# Patient Record
Sex: Female | Born: 1935 | Race: White | Hispanic: No | Marital: Married | Smoking: Former smoker
Health system: Southern US, Community
[De-identification: ages and names within clinical notes are randomized; demographics above are authoritative.]

## PROBLEM LIST (undated history)

## (undated) DIAGNOSIS — I495 Sick sinus syndrome: Secondary | ICD-10-CM

## (undated) DIAGNOSIS — G629 Polyneuropathy, unspecified: Secondary | ICD-10-CM

## (undated) DIAGNOSIS — F0390 Unspecified dementia without behavioral disturbance: Secondary | ICD-10-CM

## (undated) DIAGNOSIS — I4891 Unspecified atrial fibrillation: Secondary | ICD-10-CM

## (undated) DIAGNOSIS — K900 Celiac disease: Secondary | ICD-10-CM

## (undated) DIAGNOSIS — I509 Heart failure, unspecified: Secondary | ICD-10-CM

## (undated) DIAGNOSIS — J449 Chronic obstructive pulmonary disease, unspecified: Secondary | ICD-10-CM

## (undated) DIAGNOSIS — C3490 Malignant neoplasm of unspecified part of unspecified bronchus or lung: Secondary | ICD-10-CM

## (undated) DIAGNOSIS — R413 Other amnesia: Secondary | ICD-10-CM

## (undated) DIAGNOSIS — C801 Malignant (primary) neoplasm, unspecified: Secondary | ICD-10-CM

## (undated) DIAGNOSIS — J189 Pneumonia, unspecified organism: Secondary | ICD-10-CM

## (undated) HISTORY — PX: EYE SURGERY: SHX253

## (undated) HISTORY — DX: Polyneuropathy, unspecified: G62.9

## (undated) HISTORY — DX: Pneumonia, unspecified organism: J18.9

## (undated) HISTORY — PX: PACEMAKER INSERTION: SHX728

## (undated) HISTORY — PX: LUNG CANCER SURGERY: SHX702

## (undated) HISTORY — DX: Chronic obstructive pulmonary disease, unspecified: J44.9

## (undated) HISTORY — DX: Malignant neoplasm of unspecified part of unspecified bronchus or lung: C34.90

## (undated) HISTORY — DX: Unspecified atrial fibrillation: I48.91

---

## 2008-11-22 DIAGNOSIS — C3490 Malignant neoplasm of unspecified part of unspecified bronchus or lung: Secondary | ICD-10-CM

## 2008-11-22 HISTORY — DX: Malignant neoplasm of unspecified part of unspecified bronchus or lung: C34.90

## 2010-04-24 DIAGNOSIS — C3490 Malignant neoplasm of unspecified part of unspecified bronchus or lung: Secondary | ICD-10-CM | POA: Insufficient documentation

## 2010-08-20 DIAGNOSIS — J910 Malignant pleural effusion: Secondary | ICD-10-CM | POA: Insufficient documentation

## 2010-09-21 HISTORY — PX: LUNG LOBECTOMY: SHX167

## 2011-02-16 HISTORY — PX: PACEMAKER PLACEMENT: SHX43

## 2011-09-08 DIAGNOSIS — Z950 Presence of cardiac pacemaker: Secondary | ICD-10-CM | POA: Insufficient documentation

## 2011-09-08 DIAGNOSIS — C3490 Malignant neoplasm of unspecified part of unspecified bronchus or lung: Secondary | ICD-10-CM | POA: Insufficient documentation

## 2012-05-10 DIAGNOSIS — Z45018 Encounter for adjustment and management of other part of cardiac pacemaker: Secondary | ICD-10-CM | POA: Insufficient documentation

## 2012-06-13 DIAGNOSIS — R0602 Shortness of breath: Secondary | ICD-10-CM | POA: Insufficient documentation

## 2014-08-16 ENCOUNTER — Encounter: Payer: Self-pay | Admitting: Neurology

## 2014-08-16 ENCOUNTER — Ambulatory Visit (INDEPENDENT_AMBULATORY_CARE_PROVIDER_SITE_OTHER): Payer: Medicare Other | Admitting: Neurology

## 2014-08-16 VITALS — BP 100/82 | HR 66 | Resp 16 | Ht 61.5 in | Wt 110.0 lb

## 2014-08-16 DIAGNOSIS — K900 Celiac disease: Secondary | ICD-10-CM

## 2014-08-16 DIAGNOSIS — G629 Polyneuropathy, unspecified: Secondary | ICD-10-CM | POA: Insufficient documentation

## 2014-08-16 DIAGNOSIS — F0390 Unspecified dementia without behavioral disturbance: Secondary | ICD-10-CM

## 2014-08-16 DIAGNOSIS — I4891 Unspecified atrial fibrillation: Secondary | ICD-10-CM | POA: Insufficient documentation

## 2014-08-16 DIAGNOSIS — G589 Mononeuropathy, unspecified: Secondary | ICD-10-CM

## 2014-08-16 DIAGNOSIS — C3490 Malignant neoplasm of unspecified part of unspecified bronchus or lung: Secondary | ICD-10-CM

## 2014-08-16 MED ORDER — GABAPENTIN 300 MG PO CAPS: ORAL_CAPSULE | ORAL | Status: DC

## 2014-08-16 NOTE — Progress Notes (Signed)
Done

## 2014-08-16 NOTE — Progress Notes (Signed)
NEUROLOGY CONSULTATION NOTE  Cheryl Cannon MRN: [id number] DOB: Sep 12, 1936  Referring provider: Macarthur Critchley, PsyD Primary care provider: none  Reason for consult:  Establish care for memory loss, neuropathy  Thank you for your kind referral of Cheryl Cannon for consultation of the above symptoms. Although her history is well known to you, please allow me to reiterate it for the purpose of our medical record. The patient was accompanied to the clinic by her husband who also provides collateral information. Records and images were personally reviewed where available.  HISTORY OF PRESENT ILLNESS: This is a very pleasant 78 year old right-handed woman with a history of non-small cell lung cancer s/p surgery, chemotherapy and radiation, atrial fibrillation s/p pacemaker placement, celiac disease, presenting to establish care for mild dementia and neuropathy.  1. Memory loss.  She and her husband started noticing memory changes at the beginning of the year. She reports that her "memory has never been great," but that it became more noticeable where she would forget names of familiar people or forget recent conversations, repeat questions.  She occasionally has word-finding difficulties, and has noticed it more lately.  Her husband has always made sure she takes her medications. She has occasional difficulties multitasking. She cooks and has not left the stove on. She drove without any difficulties, and after recent neuropsychological evaluation, she underwent a road test with the [facility] and has a restricted license.  Her husband has been in charge of the bills since her diagnosis of cancer in 2010. Her husband also noted difficulties with reasoning and problem solving.  No personality changes or hallucinations.  She had seen neurologist Dr. Carmelina Paddock, head CT with and without contrast done in 03/2014 did not show any acute changes, no evidence of brain metastasis. Routine EEG 02/2014 normal.  She  underwent Neuropsychological evaluation on 03/28/14, results reviewed, impression of mild dementia most likely secondary to Alzheimer's disease (rule out mixed dementia), with significant deficits in memory, semantic fluency, processing speed, and aspects of executive functioning. There was also evidence that these deficits are beginning to interfere with ability to perform complex tasks. Cognitive profile reflective of both cortical and subcortical involvement. There is evidence of hippocampal consolidation dysfunction, which is highly concerning for Alzheimer's disease. She also has several vascular risk factors which could be contributing to subcortical dysfunction and a mixed dementia. She started Aricept, with no side effects except she now "dreams a lot," affecting her sleep.  2. Neuropathy. She reports numbness and tingling in her fingertips over the past year. She states that symptoms "seemed to progress" after she was diagnosed with neuropathy in 03/2014. She now has paresthesias (tingling) from the bottom of her feet to the top of her head. She can't taste as much due to tingling in her tongue.  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 Needle exam not done due to anticoagulation with Pradaxa. Neuropathy labs available indicate normal SPEP. She is on B12 replacement. She had been taking gabapentin 900mg  BID for 3 years for shingles in the buttock region. She had tried increasing this to 900mg  TID but did not notice much difference, no side effects.  She denies any neck or back pain. She has occasional generalized body weakness where "my legs won't work," attributed to shortness of breath.  No bowel/bladder dysfunction. Celiac disease is controlled on strict diet.    She denies any headaches, dizziness, diplopia, dysarthria, dysphagia, tremors, anosmia, REM behavior disorder. She has noticed increased salivation  for the past 1-3 months, more when asleep. She  does not choke. She has urinary frequency. She has occasional hand jerks, and notes that her hands become shaky when holding a hymn book at church, resolving when she puts the book down. She was diagnosed with lung cancer in 2010, underwent chemo until march 2011, radiation until December 2011, then another round of chemo until 2013.  She continues to follow at [facility] and has been told imaging has been stable.    PAST MEDICAL HISTORY: Past Medical History  Diagnosis Date  . Lung cancer   . A-fib   . COPD (chronic obstructive pulmonary disease)     PAST SURGICAL HISTORY: Past Surgical History  Procedure Laterality Date  . Lung cancer surgery    . Pacemaker placement      MEDICATIONS: No current outpatient prescriptions on file prior to visit.   No current facility-administered medications on file prior to visit.    ALLERGIES: Allergies  Allergen Reactions  . Gluten Meal Nausea And Vomiting  . Parabens Rash    FAMILY HISTORY: Family History  Problem Relation Age of Onset  . Cancer Mother   . Cancer Sister     SOCIAL HISTORY: History   Social History  . Marital Status: Married    Spouse Name: N/A    Number of Children: N/A  . Years of Education: N/A   Occupational History  . Not on file.   Social History Main Topics  . Smoking status: Former Research scientist (life sciences)  . Smokeless tobacco: Not on file  . Alcohol Use: Yes  . Drug Use: No  . Sexual Activity: Not on file   Other Topics Concern  . Not on file   Social History Narrative  . No narrative on file    REVIEW OF SYSTEMS: Constitutional: No fevers, chills, or sweats, no generalized fatigue, change in appetite Eyes: No visual changes, double vision, eye pain Ear, nose and throat: No hearing loss, ear pain, nasal congestion, sore throat Cardiovascular: No chest pain, palpitations Respiratory:  + shortness of breath at rest or with exertion, no wheezes GastrointestinaI: No nausea, vomiting, diarrhea, abdominal pain,  fecal incontinence Genitourinary:  No dysuria, urinary retention, + frequency Musculoskeletal:  No neck pain, back pain Integumentary: No rash, pruritus, skin lesions Neurological: as above Psychiatric: No depression, insomnia, anxiety Endocrine: No palpitations, fatigue, diaphoresis, mood swings, change in appetite, change in weight, increased thirst Hematologic/Lymphatic:  No anemia, purpura, petechiae. Allergic/Immunologic: no itchy/runny eyes, nasal congestion, recent allergic reactions, rashes  PHYSICAL EXAM: Filed Vitals:   08/16/14 1027  BP: 100/82  Pulse: 66  Resp: 16   General: No acute distress Head:  Normocephalic/atraumatic Eyes: Fundoscopic exam shows bilateral sharp discs, no vessel changes, exudates, or hemorrhages Neck: supple, no paraspinal tenderness, full range of motion Back: No paraspinal tenderness Heart: regular rate and rhythm Lungs: Clear to auscultation bilaterally. Vascular: No carotid bruits. Skin/Extremities: No rash, no edema Neurological Exam: Mental status: alert and oriented to person, place, and time, no dysarthria or aphasia, Fund of knowledge is appropriate.  Remote memory intact.  Attention and concentration are normal.    Able to name objects and repeat phrases.  Montreal Cognitive Assessment  08/16/2014  Visuospatial/ Executive (0/5) 4  Naming (0/3) 3  Attention: Read list of digits (0/2) 2  Attention: Read list of letters (0/1) 1  Attention: Serial 7 subtraction starting at 100 (0/3) 3  Language: Repeat phrase (0/2) 1  Language : Fluency (0/1) 0  Abstraction (0/2) 2  Delayed Recall (0/5) 0  Orientation (0/6) 5  Total 21  Adjusted Score (based on education) 21   Cranial nerves: CN I: not tested CN II: pupils equal, round and reactive to light, visual fields intact, fundi unremarkable. CN III, IV, VI:  full range of motion, no nystagmus, no ptosis CN V: facial sensation intact CN VII: upper and lower face symmetric CN VIII: hearing  intact to finger rub CN IX, X: gag intact, uvula midline CN XI: sternocleidomastoid and trapezius muscles intact CN XII: tongue midline Bulk & Tone: normal, no fasciculations. Motor: 5/5 throughout with no pronator drift. Sensation: decreased cold, pin, and vibration in stocking distribution up to bilateral knees.  Intact to joint position sense. Intact to all modalities on both UE.  Romberg test slight sway Deep Tendon Reflexes: +1 throughout except for absent ankle jerks, no ankle clonus Plantar responses: downgoing bilaterally Cerebellar: no incoordination on finger to nose. No dysdiadochokinesia Gait: narrow-based and steady, mild difficulty with tandem walk Tremor: none  IMPRESSION: This is a very pleasant 78 year old right-handed woman with a history of mild dementia and sensorimotor polyneuropathy, etiology possibly due to chemotherapy. She also has celiac disease which can also cause neuropathy. Celiac disease is under control with diet. Other neuropathy labs done by her other providers will be requested for review. We discussed symptomatic treatment of paresthesias, she will increase dose of gabapentin to 900 in AM, 1200mg  qhs. This may help with sleep as well. We discussed the diagnosis of mild dementia, continuation of Aricept, and the importance of physical exercise, brain stimulation exercises, and control of vascular risk factors for brain health. She received a restricted license with the [facility] and her husband will continue to monitor driving. She will follow-up in 3 months, all their questions were answered.  Thank you for allowing me to participate in the care of this patient. Please do not hesitate to call for any questions or concerns.   Ellouise Newer, M.D.  CC: Dr. Si Raider

## 2014-08-16 NOTE — Patient Instructions (Signed)
1. Continue Aricept 10mg  daily 2. Increase Gabapentin 300mg : Take 3 caps in AM, 4 caps in PM 3. Physical and brain stimulation exercises are important for brain health 4. Follow-up in 3 months

## 2014-10-20 DIAGNOSIS — J449 Chronic obstructive pulmonary disease, unspecified: Secondary | ICD-10-CM | POA: Insufficient documentation

## 2014-10-20 DIAGNOSIS — G629 Polyneuropathy, unspecified: Secondary | ICD-10-CM | POA: Insufficient documentation

## 2014-11-08 ENCOUNTER — Ambulatory Visit (INDEPENDENT_AMBULATORY_CARE_PROVIDER_SITE_OTHER): Payer: Medicare Other | Admitting: Neurology

## 2014-11-08 ENCOUNTER — Encounter: Payer: Self-pay | Admitting: Neurology

## 2014-11-08 VITALS — BP 110/64 | HR 78 | Resp 16 | Ht 61.5 in | Wt 109.0 lb

## 2014-11-08 DIAGNOSIS — F0390 Unspecified dementia without behavioral disturbance: Secondary | ICD-10-CM

## 2014-11-08 DIAGNOSIS — G629 Polyneuropathy, unspecified: Secondary | ICD-10-CM

## 2014-11-08 DIAGNOSIS — C3490 Malignant neoplasm of unspecified part of unspecified bronchus or lung: Secondary | ICD-10-CM

## 2014-11-08 NOTE — Progress Notes (Signed)
NEUROLOGY FOLLOW UP OFFICE NOTE  Cheryl Cannon [id number]  HISTORY OF PRESENT ILLNESS: I had the pleasure of seeing Cheryl Cannon in follow-up in the neurology clinic on 11/08/2014.  The patient was last seen 3 months ago for memory loss and neuropathy, and is accompanied by her husband today. Her neuropsychological evaluation in May 2015 indicated mild dementia. Her MOCA score at that time was 21/30. She had been taking Aricept 10mg /day. She feels her memory sharpness comes and goes, sometimes she cannot recall anything. Names are her biggest issue. She has a limited driver's license from the [facility] and it takes her a little while longer to learn directions and locations. She denies any difficulties multitasking or with ADLs.  She also reported paresthesias in her fingers and feet, NCV in April 2015 showed a primary sensory motor polyneuropathy. Dose of Gabapentin was increased to 900mg  in AM, 1200mg  in PM. She has not noticed much change in symptoms with higher dose, no side effects. The soles of her feet and her fingers continue to be numb, with occasional tingling in her legs. She feels unbalanced without her shoes, no falls. She was noted to have a B12 level of 227 last month and started on B12 replacement.  Most recent labs: 10/10/14 CBC and CMP normal. Vitamin B12 227; TSH 1.635; vitamin D 42.4, total cholesterol 207, LDL126.  HPI:  This is a very pleasant 78 yo RH woman with a history of non-small cell lung cancer s/p surgery, chemotherapy and radiation, atrial fibrillation s/p pacemaker placement, celiac disease, with mild dementia and neuropathy.  1. Memory loss. She and her husband started noticing memory changes at the beginning of 2015. She reports that her "memory has never been great," but that it became more noticeable where she would forget names of familiar people or forget recent conversations, repeat questions. She occasionally has word-finding difficulties, and has noticed it more  lately. Her husband has always made sure she takes her medications. She cooks and has not left the stove on. She drove without any difficulties, and after recent neuropsychological evaluation, she underwent a road test with the [facility] and has a restricted license. Her husband has been in charge of the bills since her diagnosis of cancer in 2010. Her husband also noted difficulties with reasoning and problem solving. No personality changes or hallucinations. She had seen neurologist Dr. Carmelina Paddock, head CT with and without contrast done in 03/2014 did not show any acute changes, no evidence of brain metastasis. Routine EEG 02/2014 normal. She underwent Neuropsychological evaluation on 03/28/14, results reviewed, impression of mild dementia most likely secondary to Alzheimer's disease (rule out mixed dementia), with significant deficits in memory, semantic fluency, processing speed, and aspects of executive functioning. There was also evidence that these deficits are beginning to interfere with ability to perform complex tasks. Cognitive profile reflective of both cortical and subcortical involvement. There is evidence of hippocampal consolidation dysfunction, which is highly concerning for Alzheimer's disease. She also has several vascular risk factors which could be contributing to subcortical dysfunction and a mixed dementia. She started Aricept, with no side effects except she now "dreams a lot," affecting her sleep.  2. Neuropathy. She reports numbness and tingling in her fingertips over the past year. She states that symptoms "seemed to progress" after she was diagnosed with neuropathy in 03/2014. She now has paresthesias (tingling) from the bottom of her feet to the top of her head. She can't taste as much due to tingling in her tongue. 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 Needle exam not done due to anticoagulation with Pradaxa. Neuropathy labs available  indicate normal SPEP. She is on B12 replacement. She had been taking gabapentin 900mg  BID for 3 years for shingles in the buttock region. She had tried increasing this to 900mg  TID but did not notice much difference, no side effects. She denies any neck or back pain. She has occasional generalized body weakness where "my legs won't work," attributed to shortness of breath. No bowel/bladder dysfunction. Celiac disease is controlled on strict diet.    PAST MEDICAL HISTORY: Past Medical History  Diagnosis Date  . Lung cancer   . A-fib   . COPD (chronic obstructive pulmonary disease)     MEDICATIONS: Current Outpatient Prescriptions on File Prior to Visit  Medication Sig Dispense Refill  . cholecalciferol (VITAMIN D) 1000 UNITS tablet Take 1,000 Units by mouth daily.    . dabigatran (PRADAXA) 150 MG CAPS capsule Take 150 mg by mouth 2 (two) times daily.    Marland Kitchen donepezil (ARICEPT) 10 MG tablet Take 10 mg by mouth at bedtime.    . furosemide (LASIX) 20 MG tablet Take 20 mg by mouth daily.    Marland Kitchen gabapentin (NEURONTIN) 300 MG capsule Take 3 caps in AM, 4 caps in PM 630 capsule 3  . metoprolol tartrate (LOPRESSOR) 25 MG tablet Take 25 mg by mouth. Takes 1/2 pill daily    . pantoprazole (PROTONIX) 40 MG tablet Take 40 mg by mouth daily.    Marland Kitchen tiotropium (SPIRIVA) 18 MCG inhalation capsule Place 18 mcg into inhaler and inhale as needed.     No current facility-administered medications on file prior to visit.    ALLERGIES: Allergies  Allergen Reactions  . Gluten Meal Nausea And Vomiting  . Parabens Rash    FAMILY HISTORY: Family History  Problem Relation Age of Onset  . Cancer Mother   . Cancer Sister     SOCIAL HISTORY: History   Social History  . Marital Status: Married    Spouse Name: N/A    Number of Children: N/A  . Years of Education: N/A   Occupational History  . Not on file.   Social History Main Topics  . Smoking status: Former Research scientist (life sciences)  . Smokeless tobacco: Not on  file  . Alcohol Use: Yes  . Drug Use: No  . Sexual Activity: Not on file   Other Topics Concern  . Not on file   Social History Narrative  . No narrative on file    REVIEW OF SYSTEMS: Constitutional: No fevers, chills, or sweats, no generalized fatigue, change in appetite Eyes: No visual changes, double vision, eye pain Ear, nose and throat: No hearing loss, ear pain, nasal congestion, sore throat Cardiovascular: No chest pain, palpitations Respiratory:  No shortness of breath at rest or with exertion, wheezes GastrointestinaI: No nausea, vomiting, diarrhea, abdominal pain, fecal incontinence Genitourinary:  No dysuria, urinary retention or frequency Musculoskeletal:  No neck pain, back pain Integumentary: No rash, pruritus, skin lesions Neurological: as above Psychiatric: No depression, insomnia, anxiety Endocrine: No palpitations, fatigue, diaphoresis, mood swings, change in appetite, change in weight, increased thirst Hematologic/Lymphatic:  No anemia, purpura, petechiae. Allergic/Immunologic: no itchy/runny eyes, nasal congestion, recent allergic reactions, rashes  PHYSICAL EXAM: Filed Vitals:   11/08/14 1014  BP: 110/64  Pulse: 78  Resp: 16   General: No acute distress Head:  Normocephalic/atraumatic Neck: supple, no paraspinal tenderness, full range of motion Heart:  Regular  rate and rhythm Lungs:  Clear to auscultation bilaterally Back: No paraspinal tenderness Skin/Extremities: No rash, no edema Neurological Exam: alert and oriented to person, place, and time. No aphasia or dysarthria. Fund of knowledge is appropriate.  Remote memory intact.  Attention and concentration are normal.    Able to name objects and repeat phrases.  Montreal Cognitive Assessment  11/08/2014 08/16/2014  Visuospatial/ Executive (0/5) 4 4  Naming (0/3) 3 3  Attention: Read list of digits (0/2) 2 2  Attention: Read list of letters (0/1) 1 1  Attention: Serial 7 subtraction starting at 100  (0/3) 3 3  Language: Repeat phrase (0/2) 2 1  Language : Fluency (0/1) 1 0  Abstraction (0/2) 2 2  Delayed Recall (0/5) 0 0  Orientation (0/6) 5 5  Total 23 21  Adjusted Score (based on education) 23 21   Cranial nerves:  CN II: pupils equal, round and reactive to light, visual fields intact, fundi unremarkable. CN III, IV, VI: full range of motion, no nystagmus, no ptosis CN V: facial sensation intact CN VII: upper and lower face symmetric CN VIII: hearing intact to finger rub CN IX, X: gag intact, uvula midline CN XI: sternocleidomastoid and trapezius muscles intact CN XII: tongue midline Bulk & Tone: normal, no fasciculations. Motor: 5/5 throughout with no pronator drift. Sensation: decreased cold, pin, and vibration in stocking distribution up to bilateral knees (similar to prior). Intact to joint position sense. Intact to all modalities on both UE. Romberg test slight sway Deep Tendon Reflexes: +1 throughout except for absent ankle jerks Plantar responses: downgoing bilaterally Cerebellar: no incoordination on finger to nose testing Gait: narrow-based and steady, mild difficulty with tandem walk (similar to prior) Tremor: none  IMPRESSION: This is a very pleasant 78 yo RH woman with a history of mild dementia and sensorimotor polyneuropathy, etiology possibly due to chemotherapy. She also has celiac disease which can also cause neuropathy. Celiac disease is under control with diet. Her B12 level is again low, she is now on B12 injections. She continues to have paresthesias, options were discussed, including increasing dose of gabapentin versus adding another agent such as nortriptyline. She would like to increase dose of gabapentin first and will take 900mg  in AM, 300mg  at noon, 1200mg  qhs. Side effects were discussed. We also discussed memory issues, her MOCA score is actually better today at 23/30 (previously 21/30), continue Aricept 10mg /day. The importance of physical  exercise, brain stimulation exercises, and control of vascular risk factors for brain health were discussed. They will continue to monitor driving. She will follow-up in 6 months and knows to call our office for any changes.  Thank you for allowing me to participate in her care.  Please do not hesitate to call for any questions or concerns.  The duration of this appointment visit was 15 minutes of face-to-face time with the patient.  Greater than 50% of this time was spent in counseling, explanation of diagnosis, planning of further management, and coordination of care.   Ellouise Newer, M.D.   CC: Dr. Candiss Norse

## 2014-11-08 NOTE — Patient Instructions (Signed)
1. Increase Gabapentin 300mg : Take 3 in AM, 1 at noon, 4 at bedtime 2. Continue physical exercise, brain stimulation exercises for brain health 3. Continue vitamin B12 replacement 4. Follow-up in 6 months, call our office for any changes

## 2014-11-11 NOTE — Progress Notes (Signed)
Done

## 2014-11-25 DIAGNOSIS — R197 Diarrhea, unspecified: Secondary | ICD-10-CM | POA: Insufficient documentation

## 2015-01-29 ENCOUNTER — Ambulatory Visit: Payer: Self-pay | Admitting: Specialist

## 2015-03-19 ENCOUNTER — Other Ambulatory Visit: Payer: Self-pay | Admitting: Obstetrics and Gynecology

## 2015-03-19 DIAGNOSIS — Z1231 Encounter for screening mammogram for malignant neoplasm of breast: Secondary | ICD-10-CM

## 2015-04-14 ENCOUNTER — Ambulatory Visit
Admission: RE | Admit: 2015-04-14 | Discharge: 2015-04-14 | Disposition: A | Discharge status: Home / Self Care | Payer: Medicare Other | Source: Ambulatory Visit | Attending: Obstetrics and Gynecology | Admitting: Obstetrics and Gynecology

## 2015-04-14 DIAGNOSIS — Z1231 Encounter for screening mammogram for malignant neoplasm of breast: Secondary | ICD-10-CM

## 2015-05-12 ENCOUNTER — Ambulatory Visit (INDEPENDENT_AMBULATORY_CARE_PROVIDER_SITE_OTHER): Payer: Medicare Other | Admitting: Neurology

## 2015-05-12 ENCOUNTER — Encounter: Payer: Self-pay | Admitting: Neurology

## 2015-05-12 VITALS — BP 102/68 | HR 76 | Resp 16 | Ht 61.5 in | Wt 112.0 lb

## 2015-05-12 DIAGNOSIS — F0390 Unspecified dementia without behavioral disturbance: Secondary | ICD-10-CM

## 2015-05-12 DIAGNOSIS — G609 Hereditary and idiopathic neuropathy, unspecified: Secondary | ICD-10-CM

## 2015-05-12 MED ORDER — DONEPEZIL HCL 10 MG PO TABS: 10.0000 mg | ORAL_TABLET | Freq: Every day | ORAL | Status: DC

## 2015-05-12 MED ORDER — NORTRIPTYLINE HCL 10 MG PO CAPS: ORAL_CAPSULE | ORAL | Status: DC

## 2015-05-12 NOTE — Patient Instructions (Signed)
1. Start nortriptyline '10mg'$ : Take 1 capsule at bedtime 2. Continue gabapentin '400mg'$ : Take 3 caps twice a day 3. Continue Aricept '10mg'$  daily  4. Physical exercise and brain stimulation exercises are important for brain health 5. Follow-up in 3 months

## 2015-05-12 NOTE — Progress Notes (Signed)
NEUROLOGY FOLLOW UP OFFICE NOTE  Cheryl Cannon [id number]  HISTORY OF PRESENT ILLNESS: I had the pleasure of seeing Cheryl Cannon in follow-up in the neurology clinic on 05/12/2015.  The patient was last seen 6 months ago for mild dementia and peripheral neuropathy. She is again accompanied by her husband who helps supplement the history today.  MOCA score on her last visit was 23/30. Since her last visit, she and her husband report that memory is the same. She denies getting lost driving, she continues to cook without difficulties. Her husband reminds her to take her medications. No difficulties with ADLs. She feels that now they have settled in, this has helped with her memory. On her last visit, she was also reporting that paresthesias in her feet and hands continue to be annoying. Gabapentin dose was increased to '1200mg'$  BID, with not much effect. No side effects on higher dose. She denies any pain but feels like she is constantly walking on sand. She cannot do needlework because the tips of her fingers are numb and she has mild tremor. She denies any falls and feels her balance is fine. She gets B12 injections monthly. She reports having problems with sleep, she wakes up every hour. She takes prn Xanax at night, which helps her sleep 2-1/2 to 3 hours. She denies any headaches, dizziness, diplopia, dysarthria/dysphagia, neck/back pain, bowel/bladder dysfunction.   Most recent labs: 10/10/14 CBC and CMP normal. Vitamin B12 227; TSH 1.635; vitamin D 42.4, total cholesterol 207, LDL126.  HPI: This is a very pleasant 79 yo RH woman with a history of non-small cell lung cancer s/p surgery, chemotherapy and radiation, atrial fibrillation s/p pacemaker placement, celiac disease, with mild dementia and neuropathy.  1. Memory loss. She and her husband started noticing memory changes at the beginning of 2015. She reports that her "memory has never been great," but that it became more noticeable where she would  forget names of familiar people or forget recent conversations, repeat questions. She occasionally has word-finding difficulties, and has noticed it more lately. Her husband has always made sure she takes her medications. She cooks and has not left the stove on. She drove without any difficulties, and after recent neuropsychological evaluation, she underwent a road test with the [facility] and has a restricted license. Her husband has been in charge of the bills since her diagnosis of cancer in 2010. Her husband also noted difficulties with reasoning and problem solving. No personality changes or hallucinations. She had seen neurologist Dr. Carmelina Paddock, head CT with and without contrast done in 03/2014 did not show any acute changes, no evidence of brain metastasis. Routine EEG 02/2014 normal. She underwent Neuropsychological evaluation on 03/28/14, results reviewed, impression of mild dementia most likely secondary to Alzheimer's disease (rule out mixed dementia), with significant deficits in memory, semantic fluency, processing speed, and aspects of executive functioning. There was also evidence that these deficits are beginning to interfere with ability to perform complex tasks. Cognitive profile reflective of both cortical and subcortical involvement. There is evidence of hippocampal consolidation dysfunction, which is highly concerning for Alzheimer's disease. She also has several vascular risk factors which could be contributing to subcortical dysfunction and a mixed dementia. She started Aricept, with no side effects except she now "dreams a lot," affecting her sleep.  2. Neuropathy. She reports numbness and tingling in her fingertips over the past year. She states that symptoms "seemed to progress" after she was diagnosed with neuropathy in 03/2014. She now has paresthesias (tingling)  from the bottom of her feet to the top of her head. She can't taste as much due to tingling in her tongue. 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 Needle exam not done due to anticoagulation with Pradaxa. Neuropathy labs available indicate normal SPEP. She is on B12 replacement. She had been taking gabapentin '900mg'$  BID for 3 years for shingles in the buttock region. She had tried increasing this to '900mg'$  TID but did not notice much difference, no side effects. She denies any neck or back pain. She has occasional generalized body weakness where "my legs won't work," attributed to shortness of breath. No bowel/bladder dysfunction. Celiac disease is controlled on strict diet.   PAST MEDICAL HISTORY: Past Medical History  Diagnosis Date  . Lung cancer   . A-fib   . COPD (chronic obstructive pulmonary disease)     MEDICATIONS: Current Outpatient Prescriptions on File Prior to Visit  Medication Sig Dispense Refill  . cholecalciferol (VITAMIN D) 1000 UNITS tablet Take 1,000 Units by mouth daily.    . dabigatran (PRADAXA) 150 MG CAPS capsule Take 150 mg by mouth 2 (two) times daily.    Marland Kitchen donepezil (ARICEPT) 10 MG tablet Take 10 mg by mouth at bedtime.    . flecainide (TAMBOCOR) 50 MG tablet Take 50 mg by mouth. 1&1/2 tablets bid    . furosemide (LASIX) 20 MG tablet Take 20 mg by mouth as needed.     . gabapentin (NEURONTIN) 300 MG capsule Take 3 caps in AM, 4 caps in PM (Patient taking differently: Take 4 capsules twice daily) 630 capsule 3  . metoprolol tartrate (LOPRESSOR) 25 MG tablet Takes 1/2 pill twice daily    . pantoprazole (PROTONIX) 40 MG tablet Take 40 mg by mouth as needed.     . tiotropium (SPIRIVA) 18 MCG inhalation capsule Place 18 mcg into inhaler and inhale daily.      No current facility-administered medications on file prior to visit.    ALLERGIES: Allergies  Allergen Reactions  . Gluten Meal Nausea And Vomiting  . Parabens Rash    FAMILY HISTORY: Family History  Problem Relation Age of Onset  . Cancer Mother   . Cancer Sister      SOCIAL HISTORY: History   Social History  . Marital Status: Married    Spouse Name: N/A  . Number of Children: N/A  . Years of Education: N/A   Occupational History  . Not on file.   Social History Main Topics  . Smoking status: Former Research scientist (life sciences)  . Smokeless tobacco: Not on file  . Alcohol Use: Yes  . Drug Use: No  . Sexual Activity: Not on file   Other Topics Concern  . Not on file   Social History Narrative    REVIEW OF SYSTEMS: Constitutional: No fevers, chills, or sweats, no generalized fatigue, change in appetite Eyes: No visual changes, double vision, eye pain Ear, nose and throat: No hearing loss, ear pain, nasal congestion, sore throat Cardiovascular: No chest pain, palpitations Respiratory:  No shortness of breath at rest or with exertion, wheezes GastrointestinaI: No nausea, vomiting, diarrhea, abdominal pain, fecal incontinence Genitourinary:  No dysuria, urinary retention or frequency Musculoskeletal:  No neck pain, back pain Integumentary: No rash, pruritus, skin lesions Neurological: as above Psychiatric: No depression, insomnia, anxiety Endocrine: No palpitations, fatigue, diaphoresis, mood swings, change in appetite, change in weight, increased thirst Hematologic/Lymphatic:  No anemia, purpura, petechiae. Allergic/Immunologic: no itchy/runny eyes, nasal congestion, recent allergic  reactions, rashes  PHYSICAL EXAM: Filed Vitals:   05/12/15 1008  BP: 102/68  Pulse: 76  Resp: 16   General: No acute distress Head:  Normocephalic/atraumatic Neck: supple, no paraspinal tenderness, full range of motion Heart:  Regular rate and rhythm Lungs:  Clear to auscultation bilaterally Back: No paraspinal tenderness Skin/Extremities: No rash, no edema Neurological Exam: alert and oriented to person, place, and time. No aphasia or dysarthria. Fund of knowledge is appropriate.  Remote memory intact.  Attention and concentration are normal.    Able to name objects  and repeat phrases.  Montreal Cognitive Assessment  05/12/2015 11/08/2014 08/16/2014  Visuospatial/ Executive (0/5) '5 4 4  '$ Naming (0/3) '3 3 3  '$ Attention: Read list of digits (0/2) '2 2 2  '$ Attention: Read list of letters (0/1) '1 1 1  '$ Attention: Serial 7 subtraction starting at 100 (0/3) '3 3 3  '$ Language: Repeat phrase (0/2) '2 2 1  '$ Language : Fluency (0/1) 1 1 0  Abstraction (0/2) '2 2 2  '$ Delayed Recall (0/5) 0 0 0  Orientation (0/6) '6 5 5  '$ Total '25 23 21  '$ Adjusted Score (based on education) '25 23 21    '$ Cranial nerves: Pupils equal, round, reactive to light.  Fundoscopic exam unremarkable, no papilledema. Extraocular movements intact with no nystagmus. Visual fields full. Facial sensation intact. No facial asymmetry. Tongue, uvula, palate midline.  Motor: Bulk and tone normal, muscle strength 5/5 throughout with no pronator drift.  Sensation intact to all modalities on both UE, decreased pin and cold up to above ankles bilaterally. No extinction to double simultaneous stimulation.  Deep tendon reflexes 2+ throughout except for absent ankle jerks bilaterally, toes downgoing.  Finger to nose testing intact.  Gait narrow-based and steady, able to tandem walk adequately.  Romberg test + sway  IMPRESSION: This is a very pleasant 79 yo RH woman with a history of mild dementia and sensorimotor polyneuropathy, etiology possibly due to chemotherapy. She also has celiac disease which can also cause neuropathy. Celiac disease is under control with diet. She continues on monthly B12 injections. She continues to have paresthesias despite higher dose gabapentin, no side effects. Options were discussed, she now has sleep issues and may benefit from addition of nortriptyline as membrane-stabilizing agent and hopefully help with sleep as well. She will start low dose nortriptyline '10mg'$  qhs, side effects were discussed. Continue gabapentin '1200mg'$  BID. We discussed memory issues, MOCA score is again better today at 25/30  (previously 23/30 and 21/30), continue Aricept '10mg'$ /day. The importance of physical exercise, brain stimulation exercises, and control of vascular risk factors for brain health were discussed. All her questions were answered, she continues to write everything down and keep a calendar. She will follow-up in 3 months and knows to call our office for any changes  Thank you for allowing me to participate in her care.  Please do not hesitate to call for any questions or concerns.  The duration of this appointment visit was 25 minutes of face-to-face time with the patient.  Greater than 50% of this time was spent in counseling, explanation of diagnosis, planning of further management, and coordination of care.   Ellouise Newer, M.D.   CC: Dr. Candiss Norse

## 2015-05-14 ENCOUNTER — Telehealth: Payer: Self-pay | Admitting: Neurology

## 2015-05-14 NOTE — Telephone Encounter (Signed)
Pt husband Elenore Rota [phone] needs to talk to some one about her aricept  Medication

## 2015-05-14 NOTE — Telephone Encounter (Signed)
I returned patient's call. He wanted to check to see if we received fax from [facility] about patient's new Rx for Aricept in relation possible interaction with the flecainide the patient is on. I did let him know that we did receive the fax and Dr. Delice Lesch is working on it and once she completes the form I will fax it back to [facility].  Form faxed back to [facility] to fill as written & prescriber is aware of the interaction is monitoring.

## 2015-06-06 ENCOUNTER — Other Ambulatory Visit: Payer: Self-pay | Admitting: Physician Assistant

## 2015-06-06 ENCOUNTER — Other Ambulatory Visit: Payer: Self-pay | Admitting: Nurse Practitioner

## 2015-06-06 DIAGNOSIS — K209 Esophagitis, unspecified without bleeding: Secondary | ICD-10-CM

## 2015-06-06 DIAGNOSIS — R1310 Dysphagia, unspecified: Secondary | ICD-10-CM

## 2015-06-09 DIAGNOSIS — Z8719 Personal history of other diseases of the digestive system: Secondary | ICD-10-CM | POA: Insufficient documentation

## 2015-06-10 ENCOUNTER — Ambulatory Visit: Payer: Medicare Other

## 2015-06-10 ENCOUNTER — Ambulatory Visit
Admission: RE | Admit: 2015-06-10 | Discharge: 2015-06-10 | Disposition: A | Discharge status: Home / Self Care | Payer: Medicare Other | Source: Ambulatory Visit | Attending: Nurse Practitioner | Admitting: Nurse Practitioner

## 2015-06-10 DIAGNOSIS — K209 Esophagitis, unspecified: Secondary | ICD-10-CM | POA: Diagnosis present

## 2015-06-10 DIAGNOSIS — K228 Other specified diseases of esophagus: Secondary | ICD-10-CM | POA: Insufficient documentation

## 2015-06-10 DIAGNOSIS — R1310 Dysphagia, unspecified: Secondary | ICD-10-CM

## 2015-07-04 ENCOUNTER — Other Ambulatory Visit: Payer: Self-pay | Admitting: Neurology

## 2015-07-04 NOTE — Telephone Encounter (Signed)
Dr. Delice Lesch can you clarify dose she is supposed to be on. Rx request if different from what's in her chart according to her last ov.

## 2015-07-07 NOTE — Telephone Encounter (Signed)
Pls confirm with patient as well, but she reported she was taking '300mg'$  4 caps BID. If yes, pls send new Rx, thanks

## 2015-07-14 DIAGNOSIS — F03A0 Unspecified dementia, mild, without behavioral disturbance, psychotic disturbance, mood disturbance, and anxiety: Secondary | ICD-10-CM | POA: Insufficient documentation

## 2015-07-14 DIAGNOSIS — G4700 Insomnia, unspecified: Secondary | ICD-10-CM | POA: Insufficient documentation

## 2015-07-14 DIAGNOSIS — F0390 Unspecified dementia without behavioral disturbance: Secondary | ICD-10-CM | POA: Insufficient documentation

## 2015-08-13 ENCOUNTER — Ambulatory Visit (INDEPENDENT_AMBULATORY_CARE_PROVIDER_SITE_OTHER): Payer: Medicare Other | Admitting: Neurology

## 2015-08-13 ENCOUNTER — Encounter: Payer: Self-pay | Admitting: Neurology

## 2015-08-13 VITALS — BP 100/70 | HR 74 | Resp 16 | Ht 61.5 in | Wt 115.0 lb

## 2015-08-13 DIAGNOSIS — G609 Hereditary and idiopathic neuropathy, unspecified: Secondary | ICD-10-CM | POA: Diagnosis not present

## 2015-08-13 DIAGNOSIS — IMO0001 Reserved for inherently not codable concepts without codable children: Secondary | ICD-10-CM | POA: Insufficient documentation

## 2015-08-13 DIAGNOSIS — F0390 Unspecified dementia without behavioral disturbance: Secondary | ICD-10-CM | POA: Diagnosis not present

## 2015-08-13 MED ORDER — NORTRIPTYLINE HCL 10 MG PO CAPS: ORAL_CAPSULE | ORAL | Status: DC

## 2015-08-13 NOTE — Patient Instructions (Addendum)
1. Take nortriptyline '10mg'$ : Take 1 capsule at night for 1 week, then increase to 2 capsules at night. Call our office after a month to let us know how you are feeling. 2. Continue Aricept and Gabapentin as prescribed 3. Follow-up in 6 months, call for any changes

## 2015-08-13 NOTE — Progress Notes (Signed)
NEUROLOGY FOLLOW UP OFFICE NOTE  Cheryl Cannon [id number]  HISTORY OF PRESENT ILLNESS: I had the pleasure of seeing Cheryl Cannon in follow-up in the neurology clinic on 08/13/2015.  The patient was last seen 3 months ago for mild dementia and peripheral neuropathy. She is again accompanied by her husband who helps supplement the history today. MOCA score on her last visit was 25/30. She feels that her memory is better, now that things have settled down with their move. She is taking Aricept '10mg'$  daily without side effects, the dreams have gone away. She only drives short distances and denies getting lost driving. She denies any missed medications. Her husband has always been in charge of bills. She continues to have paresthesias in her hands and feet that she describes as "annoying but no pain." She feels like she is always walking on sand. She takes Gabapentin '1200mg'$  BID with no change. Nortriptyline '10mg'$  qhs was added on her last visit, which she only took for a few days then stopped because she did not notice much change, but did not have any side effects. She denies any falls, occasionally she feels off balance. She has noticed occasional tingling in her tongue. No difficulty chewing or swallowing. She has occasional shortness of breath with certain activities and will be seeing her pulmonologist and cardiologist soon. No chest pain or palpitations. She denies any headaches, dizziness, diplopia, dysarthria/dysphagia, neck/back pain, bowel/bladder dysfunction.   Most recent labs: 10/10/14 CBC and CMP normal. Vitamin B12 227; TSH 1.635; vitamin D 42.4, total cholesterol 207, LDL126.  HPI: This is a very pleasant 79 yo RH woman with a history of non-small cell lung cancer s/p surgery, chemotherapy and radiation, atrial fibrillation s/p pacemaker placement, celiac disease, with mild dementia and neuropathy.  1. Memory loss. She and her husband started noticing memory changes at the beginning of 2015.  She reports that her "memory has never been great," but that it became more noticeable where she would forget names of familiar people or forget recent conversations, repeat questions. She occasionally has word-finding difficulties, and has noticed it more lately. Her husband has always made sure she takes her medications. She cooks and has not left the stove on. She drove without any difficulties, and after recent neuropsychological evaluation, she underwent a road test with the [facility] and has a restricted license. Her husband has been in charge of the bills since her diagnosis of cancer in 2010. Her husband also noted difficulties with reasoning and problem solving. No personality changes or hallucinations. She had seen neurologist Dr. Carmelina Paddock, head CT with and without contrast done in 03/2014 did not show any acute changes, no evidence of brain metastasis. Routine EEG 02/2014 normal. She underwent Neuropsychological evaluation on 03/28/14, results reviewed, impression of mild dementia most likely secondary to Alzheimer's disease (rule out mixed dementia), with significant deficits in memory, semantic fluency, processing speed, and aspects of executive functioning. There was also evidence that these deficits are beginning to interfere with ability to perform complex tasks. Cognitive profile reflective of both cortical and subcortical involvement. There is evidence of hippocampal consolidation dysfunction, which is highly concerning for Alzheimer's disease. She also has several vascular risk factors which could be contributing to subcortical dysfunction and a mixed dementia. She started Aricept, with no side effects except she now "dreams a lot," affecting her sleep.  2. Neuropathy. She reports numbness and tingling in her fingertips over the past year. She states that symptoms "seemed to progress" after she was diagnosed  with neuropathy in 03/2014. She now has paresthesias (tingling) from the bottom of  her feet to the top of her head. She can't taste as much due to tingling in her tongue. 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 Needle exam not done due to anticoagulation with Pradaxa. Neuropathy labs available indicate normal SPEP. She is on B12 replacement. She had been taking gabapentin '900mg'$  BID for 3 years for shingles in the buttock region. She had tried increasing this to '900mg'$  TID but did not notice much difference, no side effects. She denies any neck or back pain. She has occasional generalized body weakness where "my legs won't work," attributed to shortness of breath. No bowel/bladder dysfunction. Celiac disease is controlled on strict diet.   PAST MEDICAL HISTORY: Past Medical History  Diagnosis Date  . Lung cancer   . A-fib   . COPD (chronic obstructive pulmonary disease)     MEDICATIONS: Current Outpatient Prescriptions on File Prior to Visit  Medication Sig Dispense Refill  . ALPRAZolam (XANAX) 0.25 MG tablet Take 0.25 mg by mouth as needed for anxiety.    . cholecalciferol (VITAMIN D) 1000 UNITS tablet Take 1,000 Units by mouth daily.    . clobetasol ointment (TEMOVATE) 0.27 % 1 application. Uses Mon, Wed, Fri    . cyanocobalamin (,VITAMIN B-12,) 1000 MCG/ML injection Injection once a month    . dabigatran (PRADAXA) 150 MG CAPS capsule Take 150 mg by mouth 2 (two) times daily.    Marland Kitchen donepezil (ARICEPT) 10 MG tablet Take 1 tablet (10 mg total) by mouth at bedtime. 90 tablet 3  . flecainide (TAMBOCOR) 50 MG tablet Take 50 mg by mouth. 1&1/2 tablets bid    . furosemide (LASIX) 20 MG tablet Take 20 mg by mouth as needed.     . gabapentin (NEURONTIN) 300 MG capsule Take 4 capsules in the morning and 4 capsules in the evening 720 capsule 2  . levalbuterol (XOPENEX HFA) 45 MCG/ACT inhaler Inhale 2 puffs into the lungs every 4 (four) hours as needed for wheezing.    . metoprolol tartrate (LOPRESSOR) 25 MG tablet Takes 1/2 pill twice  daily    . pantoprazole (PROTONIX) 40 MG tablet Take 40 mg by mouth as needed.     . tiotropium (SPIRIVA) 18 MCG inhalation capsule Place 18 mcg into inhaler and inhale daily.      No current facility-administered medications on file prior to visit.    ALLERGIES: Allergies  Allergen Reactions  . Gluten Meal Nausea And Vomiting  . Parabens Rash    FAMILY HISTORY: Family History  Problem Relation Age of Onset  . Cancer Mother   . Cancer Sister     SOCIAL HISTORY: Social History   Social History  . Marital Status: Married    Spouse Name: N/A  . Number of Children: N/A  . Years of Education: N/A   Occupational History  . Not on file.   Social History Main Topics  . Smoking status: Former Research scientist (life sciences)  . Smokeless tobacco: Not on file  . Alcohol Use: Yes  . Drug Use: No  . Sexual Activity: Not on file   Other Topics Concern  . Not on file   Social History Narrative    REVIEW OF SYSTEMS: Constitutional: No fevers, chills, or sweats, no generalized fatigue, change in appetite Eyes: No visual changes, double vision, eye pain Ear, nose and throat: No hearing loss, ear pain, nasal congestion, sore throat Cardiovascular: No chest pain,  palpitations Respiratory:  No shortness of breath at rest or with exertion, wheezes GastrointestinaI: No nausea, vomiting, diarrhea, abdominal pain, fecal incontinence Genitourinary:  No dysuria, urinary retention or frequency Musculoskeletal:  No neck pain, back pain Integumentary: No rash, pruritus, skin lesions Neurological: as above Psychiatric: No depression, insomnia, anxiety Endocrine: No palpitations, fatigue, diaphoresis, mood swings, change in appetite, change in weight, increased thirst Hematologic/Lymphatic:  No anemia, purpura, petechiae. Allergic/Immunologic: no itchy/runny eyes, nasal congestion, recent allergic reactions, rashes  PHYSICAL EXAM: Filed Vitals:   08/13/15 0938  BP: 100/70  Pulse: 74  Resp: 16   General:  No acute distress Head:  Normocephalic/atraumatic Neck: supple, no paraspinal tenderness, full range of motion Heart:  Regular rate and rhythm Lungs:  Clear to auscultation bilaterally Back: No paraspinal tenderness Skin/Extremities: No rash, no edema Neurological Exam: alert and oriented to person, place, and time. No aphasia or dysarthria. Fund of knowledge is appropriate.  Recent and remote memory are intact. 3/3 delayed recall.  Attention and concentration are normal.    Able to name objects and repeat phrases. Cranial nerves: Pupils equal, round, reactive to light.  Fundoscopic exam unremarkable, no papilledema. Extraocular movements intact with no nystagmus. Visual fields full. Facial sensation intact. No facial asymmetry. Tongue, uvula, palate midline.  Motor: Bulk and tone normal, muscle strength 5/5 throughout with no pronator drift.  Sensation to light touch intact.  No extinction to double simultaneous stimulation.  Deep tendon reflexes 2+ throughout except for absent ankle jerks bilaterally, toes downgoing.  Finger to nose testing intact.  Gait narrow-based and steady, able to tandem walk adequately.  Romberg negative.  IMPRESSION: This is a very pleasant 79 yo RH woman with a history of mild dementia and sensorimotor polyneuropathy, etiology possibly due to chemotherapy. She also has celiac disease which can also cause neuropathy. Celiac disease is under control with diet. She feels memory is better, MOCA on last visit June 2016 was 25/30. Continue Aricept '10mg'$  daily. She continues to have paresthesias despite higher dose gabapentin, no side effects. She briefly took low dose nortriptyline but stopped it without uptitration. She is agreeable to restarting nortriptyline '10mg'$  qhs for 1 week, increase to '20mg'$  qhs, and will call us after a month. If no side effects, we will continue to uptitrate as tolerated. Continue gabapentin '1200mg'$  BID. The importance of physical exercise, brain stimulation  exercises, and control of vascular risk factors for brain health were again discussed. All her questions were answered. She will follow-up in 6 months and knows to call our office for any changes  Thank you for allowing me to participate in her care.  Please do not hesitate to call for any questions or concerns.  The duration of this appointment visit was 24 minutes of face-to-face time with the patient.  Greater than 50% of this time was spent in counseling, explanation of diagnosis, planning of further management, and coordination of care.   Ellouise Newer, M.D.   CC: Dr. Candiss Norse

## 2015-08-28 ENCOUNTER — Telehealth: Payer: Self-pay | Admitting: Neurology

## 2015-08-28 NOTE — Telephone Encounter (Signed)
I spoke with patient. She states that she started having issues with the Nortriptyline about 5 days after she started on the higher dose. She states that she became "shaky and out of it", states she couldn't focus or felt like she couldn't function. She has now gone back down to taking 1 capsule a day and states that she is doing fine on that dose. I did tell her it was ok for her to continue the 10 mg dose for now.

## 2015-08-28 NOTE — Telephone Encounter (Signed)
Pt need to talk to someone about medication dosage please call [phone]

## 2015-08-29 NOTE — Telephone Encounter (Signed)
Agree, continue on low dose for 1 month, then call us, we may try increase again at that point. Thanks

## 2015-08-29 NOTE — Telephone Encounter (Signed)
Spoke with patient she is going to call with an update in a month.

## 2015-09-04 DIAGNOSIS — I10 Essential (primary) hypertension: Secondary | ICD-10-CM | POA: Insufficient documentation

## 2015-09-04 DIAGNOSIS — I071 Rheumatic tricuspid insufficiency: Secondary | ICD-10-CM | POA: Insufficient documentation

## 2015-09-04 DIAGNOSIS — I340 Nonrheumatic mitral (valve) insufficiency: Secondary | ICD-10-CM | POA: Insufficient documentation

## 2015-09-25 DIAGNOSIS — I272 Pulmonary hypertension, unspecified: Secondary | ICD-10-CM | POA: Insufficient documentation

## 2015-10-14 DIAGNOSIS — E538 Deficiency of other specified B group vitamins: Secondary | ICD-10-CM | POA: Insufficient documentation

## 2015-10-14 DIAGNOSIS — J449 Chronic obstructive pulmonary disease, unspecified: Secondary | ICD-10-CM | POA: Insufficient documentation

## 2015-11-04 ENCOUNTER — Encounter
Admission: RE | Admit: 2015-11-04 | Discharge: 2015-11-04 | Disposition: A | Discharge status: Home / Self Care | Payer: Medicare Other | Source: Ambulatory Visit | Attending: Internal Medicine | Admitting: Internal Medicine

## 2015-11-04 DIAGNOSIS — R531 Weakness: Secondary | ICD-10-CM | POA: Insufficient documentation

## 2015-11-04 DIAGNOSIS — R55 Syncope and collapse: Secondary | ICD-10-CM | POA: Insufficient documentation

## 2015-11-06 DIAGNOSIS — R55 Syncope and collapse: Secondary | ICD-10-CM | POA: Diagnosis not present

## 2015-11-06 DIAGNOSIS — R531 Weakness: Secondary | ICD-10-CM | POA: Diagnosis present

## 2015-11-06 LAB — CBC WITH DIFFERENTIAL/PLATELET
BASOS ABS: 0 10*3/uL (ref 0–0.1)
Basophils Relative: 0 %
Eosinophils Absolute: 0.1 10*3/uL (ref 0–0.7)
Eosinophils Relative: 1 %
HEMATOCRIT: 34.9 % — AB (ref 35.0–47.0)
Hemoglobin: 11.6 g/dL — ABNORMAL LOW (ref 12.0–16.0)
Lymphocytes Relative: 12 %
Lymphs Abs: 0.8 10*3/uL — ABNORMAL LOW (ref 1.0–3.6)
MCH: 35.1 pg — ABNORMAL HIGH (ref 26.0–34.0)
MCHC: 33.3 g/dL (ref 32.0–36.0)
MCV: 105.2 fL — ABNORMAL HIGH (ref 80.0–100.0)
Monocytes Absolute: 0.8 10*3/uL (ref 0.2–0.9)
Monocytes Relative: 12 %
NEUTROS ABS: 4.9 10*3/uL (ref 1.4–6.5)
Neutrophils Relative %: 75 %
PLATELETS: 142 10*3/uL — AB (ref 150–440)
RBC: 3.32 MIL/uL — ABNORMAL LOW (ref 3.80–5.20)
RDW: 14.9 % — ABNORMAL HIGH (ref 11.5–14.5)
WBC: 6.5 10*3/uL (ref 3.6–11.0)

## 2015-11-07 DIAGNOSIS — R55 Syncope and collapse: Secondary | ICD-10-CM | POA: Diagnosis not present

## 2015-11-07 LAB — COMPREHENSIVE METABOLIC PANEL
ALBUMIN: 3.7 g/dL (ref 3.5–5.0)
ALT: 23 U/L (ref 14–54)
ANION GAP: 10 (ref 5–15)
AST: 30 U/L (ref 15–41)
Alkaline Phosphatase: 71 U/L (ref 38–126)
BUN: 19 mg/dL (ref 6–20)
CALCIUM: 8.7 mg/dL — AB (ref 8.9–10.3)
CHLORIDE: 93 mmol/L — AB (ref 101–111)
CO2: 26 mmol/L (ref 22–32)
CREATININE: 0.92 mg/dL (ref 0.44–1.00)
GFR calc Af Amer: >60 mL/min (ref >60)
GFR calc non Af Amer: 59 mL/min — ABNORMAL LOW (ref >60)
GLUCOSE: 97 mg/dL (ref 65–99)
POTASSIUM: 3.9 mmol/L (ref 3.5–5.1)
SODIUM: 129 mmol/L — AB (ref 135–145)
Total Bilirubin: 1.3 mg/dL — ABNORMAL HIGH (ref 0.3–1.2)
Total Protein: 6.3 g/dL — ABNORMAL LOW (ref 6.5–8.1)

## 2015-11-07 LAB — CBC WITH DIFFERENTIAL/PLATELET
BASOS ABS: 0 10*3/uL (ref 0–0.1)
BASOS PCT: 0 %
EOS ABS: 0.1 10*3/uL (ref 0–0.7)
EOS PCT: 1 %
HEMATOCRIT: 33 % — AB (ref 35.0–47.0)
Hemoglobin: 11.2 g/dL — ABNORMAL LOW (ref 12.0–16.0)
Lymphocytes Relative: 7 %
Lymphs Abs: 0.4 10*3/uL — ABNORMAL LOW (ref 1.0–3.6)
MCH: 35.7 pg — ABNORMAL HIGH (ref 26.0–34.0)
MCHC: 33.9 g/dL (ref 32.0–36.0)
MCV: 105.2 fL — ABNORMAL HIGH (ref 80.0–100.0)
MONO ABS: 0.6 10*3/uL (ref 0.2–0.9)
MONOS PCT: 10 %
Neutro Abs: 4.8 10*3/uL (ref 1.4–6.5)
Neutrophils Relative %: 82 %
PLATELETS: 121 10*3/uL — AB (ref 150–440)
RBC: 3.13 MIL/uL — ABNORMAL LOW (ref 3.80–5.20)
RDW: 14.8 % — AB (ref 11.5–14.5)
WBC: 5.8 10*3/uL (ref 3.6–11.0)

## 2015-11-10 DIAGNOSIS — R55 Syncope and collapse: Secondary | ICD-10-CM | POA: Diagnosis not present

## 2015-11-10 LAB — CBC WITH DIFFERENTIAL/PLATELET
Basophils Absolute: 0 10*3/uL (ref 0–0.1)
Basophils Relative: 0 %
EOS ABS: 0.1 10*3/uL (ref 0–0.7)
EOS PCT: 2 %
HCT: 33 % — ABNORMAL LOW (ref 35.0–47.0)
Hemoglobin: 10.9 g/dL — ABNORMAL LOW (ref 12.0–16.0)
LYMPHS ABS: 0.5 10*3/uL — AB (ref 1.0–3.6)
Lymphocytes Relative: 9 %
MCH: 35.3 pg — AB (ref 26.0–34.0)
MCHC: 33.1 g/dL (ref 32.0–36.0)
MCV: 106.7 fL — AB (ref 80.0–100.0)
MONO ABS: 0.5 10*3/uL (ref 0.2–0.9)
MONOS PCT: 9 %
Neutro Abs: 4.2 10*3/uL (ref 1.4–6.5)
Neutrophils Relative %: 80 %
Platelets: 128 10*3/uL — ABNORMAL LOW (ref 150–440)
RBC: 3.1 MIL/uL — AB (ref 3.80–5.20)
RDW: 15.9 % — ABNORMAL HIGH (ref 11.5–14.5)
WBC: 5.3 10*3/uL (ref 3.6–11.0)

## 2015-11-10 LAB — COMPREHENSIVE METABOLIC PANEL
ALBUMIN: 3.3 g/dL — AB (ref 3.5–5.0)
ALK PHOS: 73 U/L (ref 38–126)
ALT: 19 U/L (ref 14–54)
AST: 27 U/L (ref 15–41)
Anion gap: 5 (ref 5–15)
BILIRUBIN TOTAL: 1 mg/dL (ref 0.3–1.2)
BUN: 7 mg/dL (ref 6–20)
CALCIUM: 8.5 mg/dL — AB (ref 8.9–10.3)
CO2: 28 mmol/L (ref 22–32)
CREATININE: 0.85 mg/dL (ref 0.44–1.00)
Chloride: 105 mmol/L (ref 101–111)
GFR calc Af Amer: >60 mL/min (ref >60)
GFR calc non Af Amer: >60 mL/min (ref >60)
GLUCOSE: 86 mg/dL (ref 65–99)
Potassium: 3.7 mmol/L (ref 3.5–5.1)
Sodium: 138 mmol/L (ref 135–145)
Total Protein: 5.7 g/dL — ABNORMAL LOW (ref 6.5–8.1)

## 2015-11-10 LAB — TSH: TSH: 1.78 u[IU]/mL (ref 0.350–4.500)

## 2015-11-10 LAB — VITAMIN B12: VITAMIN B 12: 1559 pg/mL — AB (ref 180–914)

## 2015-11-10 LAB — MAGNESIUM: Magnesium: 2 mg/dL (ref 1.7–2.4)

## 2015-11-10 LAB — FOLATE: Folate: 11.4 ng/mL (ref >5.9)

## 2015-11-11 ENCOUNTER — Emergency Department: Payer: Medicare Other

## 2015-11-11 ENCOUNTER — Observation Stay
Admission: EM | Admit: 2015-11-11 | Discharge: 2015-11-13 | Disposition: A | Discharge status: Home / Self Care | Payer: Medicare Other | Source: Home / Self Care | Attending: Emergency Medicine | Admitting: Emergency Medicine

## 2015-11-11 DIAGNOSIS — Z7901 Long term (current) use of anticoagulants: Secondary | ICD-10-CM

## 2015-11-11 DIAGNOSIS — J9811 Atelectasis: Secondary | ICD-10-CM | POA: Insufficient documentation

## 2015-11-11 DIAGNOSIS — R601 Generalized edema: Secondary | ICD-10-CM | POA: Insufficient documentation

## 2015-11-11 DIAGNOSIS — I480 Paroxysmal atrial fibrillation: Secondary | ICD-10-CM

## 2015-11-11 DIAGNOSIS — K219 Gastro-esophageal reflux disease without esophagitis: Secondary | ICD-10-CM

## 2015-11-11 DIAGNOSIS — Z85118 Personal history of other malignant neoplasm of bronchus and lung: Secondary | ICD-10-CM | POA: Insufficient documentation

## 2015-11-11 DIAGNOSIS — M6281 Muscle weakness (generalized): Secondary | ICD-10-CM | POA: Insufficient documentation

## 2015-11-11 DIAGNOSIS — Z87891 Personal history of nicotine dependence: Secondary | ICD-10-CM

## 2015-11-11 DIAGNOSIS — E559 Vitamin D deficiency, unspecified: Secondary | ICD-10-CM

## 2015-11-11 DIAGNOSIS — R0602 Shortness of breath: Secondary | ICD-10-CM | POA: Diagnosis not present

## 2015-11-11 DIAGNOSIS — J069 Acute upper respiratory infection, unspecified: Secondary | ICD-10-CM | POA: Insufficient documentation

## 2015-11-11 DIAGNOSIS — G609 Hereditary and idiopathic neuropathy, unspecified: Secondary | ICD-10-CM | POA: Insufficient documentation

## 2015-11-11 DIAGNOSIS — I2699 Other pulmonary embolism without acute cor pulmonale: Secondary | ICD-10-CM

## 2015-11-11 DIAGNOSIS — J189 Pneumonia, unspecified organism: Secondary | ICD-10-CM | POA: Insufficient documentation

## 2015-11-11 DIAGNOSIS — J9600 Acute respiratory failure, unspecified whether with hypoxia or hypercapnia: Secondary | ICD-10-CM

## 2015-11-11 DIAGNOSIS — F0390 Unspecified dementia without behavioral disturbance: Secondary | ICD-10-CM | POA: Insufficient documentation

## 2015-11-11 DIAGNOSIS — J90 Pleural effusion, not elsewhere classified: Secondary | ICD-10-CM

## 2015-11-11 DIAGNOSIS — G3184 Mild cognitive impairment, so stated: Secondary | ICD-10-CM | POA: Insufficient documentation

## 2015-11-11 DIAGNOSIS — J439 Emphysema, unspecified: Secondary | ICD-10-CM

## 2015-11-11 DIAGNOSIS — Z79899 Other long term (current) drug therapy: Secondary | ICD-10-CM | POA: Insufficient documentation

## 2015-11-11 DIAGNOSIS — J440 Chronic obstructive pulmonary disease with acute lower respiratory infection: Secondary | ICD-10-CM | POA: Diagnosis not present

## 2015-11-11 DIAGNOSIS — Z91048 Other nonmedicinal substance allergy status: Secondary | ICD-10-CM | POA: Insufficient documentation

## 2015-11-11 DIAGNOSIS — Y95 Nosocomial condition: Secondary | ICD-10-CM

## 2015-11-11 LAB — VITAMIN D 25 HYDROXY (VIT D DEFICIENCY, FRACTURES): VIT D 25 HYDROXY: 43 ng/mL (ref 30.0–100.0)

## 2015-11-11 NOTE — ED Notes (Signed)
Pt with hx of lung ca presents with sob x 1 day. Pt recently dx with "lung infection" and treated.

## 2015-11-12 ENCOUNTER — Observation Stay: Payer: Medicare Other

## 2015-11-12 ENCOUNTER — Emergency Department: Payer: Medicare Other

## 2015-11-12 DIAGNOSIS — J189 Pneumonia, unspecified organism: Secondary | ICD-10-CM | POA: Diagnosis present

## 2015-11-12 LAB — COMPREHENSIVE METABOLIC PANEL
ALT: 26 U/L (ref 14–54)
AST: 45 U/L — ABNORMAL HIGH (ref 15–41)
Albumin: 4 g/dL (ref 3.5–5.0)
Alkaline Phosphatase: 106 U/L (ref 38–126)
Anion gap: 6 (ref 5–15)
BILIRUBIN TOTAL: 1.4 mg/dL — AB (ref 0.3–1.2)
BUN: 12 mg/dL (ref 6–20)
CALCIUM: 9.3 mg/dL (ref 8.9–10.3)
CO2: 27 mmol/L (ref 22–32)
CREATININE: 0.86 mg/dL (ref 0.44–1.00)
Chloride: 103 mmol/L (ref 101–111)
GFR calc Af Amer: >60 mL/min (ref >60)
GLUCOSE: 132 mg/dL — AB (ref 65–99)
Potassium: 4.4 mmol/L (ref 3.5–5.1)
Sodium: 136 mmol/L (ref 135–145)
TOTAL PROTEIN: 7.2 g/dL (ref 6.5–8.1)

## 2015-11-12 LAB — CBC WITH DIFFERENTIAL/PLATELET
BASOS ABS: 0 10*3/uL (ref 0–0.1)
BASOS PCT: 0 %
Eosinophils Absolute: 0.1 10*3/uL (ref 0–0.7)
Eosinophils Relative: 2 %
HCT: 36.5 % (ref 35.0–47.0)
Hemoglobin: 12.1 g/dL (ref 12.0–16.0)
LYMPHS PCT: 9 %
Lymphs Abs: 0.6 10*3/uL — ABNORMAL LOW (ref 1.0–3.6)
MCH: 35.5 pg — ABNORMAL HIGH (ref 26.0–34.0)
MCHC: 33.1 g/dL (ref 32.0–36.0)
MCV: 107.5 fL — ABNORMAL HIGH (ref 80.0–100.0)
MONO ABS: 0.6 10*3/uL (ref 0.2–0.9)
Monocytes Relative: 9 %
Neutro Abs: 5.7 10*3/uL (ref 1.4–6.5)
Neutrophils Relative %: 80 %
Platelets: 157 10*3/uL (ref 150–440)
RBC: 3.4 MIL/uL — ABNORMAL LOW (ref 3.80–5.20)
RDW: 15.8 % — AB (ref 11.5–14.5)
WBC: 7.1 10*3/uL (ref 3.6–11.0)

## 2015-11-12 LAB — TSH: TSH: 4.225 u[IU]/mL (ref 0.350–4.500)

## 2015-11-12 LAB — MRSA PCR SCREENING: MRSA by PCR: NEGATIVE

## 2015-11-12 LAB — HEMOGLOBIN A1C: Hgb A1c MFr Bld: 5.3 % (ref 4.0–6.0)

## 2015-11-12 MED ORDER — LACTULOSE 10 GM/15ML PO SOLN
10.0000 g | Freq: Every day | ORAL | Status: DC | PRN
  Filled 2015-11-12: qty 15

## 2015-11-12 MED ORDER — ACETAMINOPHEN 650 MG RE SUPP: 650.0000 mg | Freq: Four times a day (QID) | RECTAL | Status: DC | PRN

## 2015-11-12 MED ORDER — IPRATROPIUM-ALBUTEROL 0.5-2.5 (3) MG/3ML IN SOLN
3.0000 mL | Freq: Four times a day (QID) | RESPIRATORY_TRACT | Status: DC
  Administered 2015-11-12 – 2015-11-13 (×4): 3 mL via RESPIRATORY_TRACT
  Filled 2015-11-12 (×4): qty 3

## 2015-11-12 MED ORDER — LACTULOSE 10 GM/15ML PO SOLN
10.0000 g | Freq: Every day | ORAL | Status: DC | PRN
  Filled 2015-11-12: qty 30

## 2015-11-12 MED ORDER — SODIUM CHLORIDE 0.9 % IJ SOLN
3.0000 mL | Freq: Two times a day (BID) | INTRAMUSCULAR | Status: DC
  Administered 2015-11-12 – 2015-11-13 (×3): 3 mL via INTRAVENOUS

## 2015-11-12 MED ORDER — MORPHINE SULFATE (PF) 2 MG/ML IV SOLN: 2.0000 mg | INTRAVENOUS | Status: DC | PRN

## 2015-11-12 MED ORDER — ONDANSETRON HCL 4 MG PO TABS
4.0000 mg | ORAL_TABLET | Freq: Four times a day (QID) | ORAL | Status: DC | PRN
Start: 2015-11-12 — End: 2015-11-13

## 2015-11-12 MED ORDER — IPRATROPIUM-ALBUTEROL 0.5-2.5 (3) MG/3ML IN SOLN
3.0000 mL | Freq: Once | RESPIRATORY_TRACT | Status: AC
  Administered 2015-11-12: 3 mL via RESPIRATORY_TRACT
  Filled 2015-11-12: qty 3

## 2015-11-12 MED ORDER — DONEPEZIL HCL 5 MG PO TABS
10.0000 mg | ORAL_TABLET | Freq: Every day | ORAL | Status: DC
  Administered 2015-11-12: 20:00:00 10 mg via ORAL
  Filled 2015-11-12: qty 2

## 2015-11-12 MED ORDER — FUROSEMIDE 20 MG PO TABS: 20.0000 mg | ORAL_TABLET | Freq: Every day | ORAL | Status: DC | PRN

## 2015-11-12 MED ORDER — ACETAMINOPHEN 325 MG PO TABS: 650.0000 mg | ORAL_TABLET | Freq: Four times a day (QID) | ORAL | Status: DC | PRN

## 2015-11-12 MED ORDER — ENOXAPARIN SODIUM 60 MG/0.6ML ~~LOC~~ SOLN
1.0000 mg/kg | Freq: Two times a day (BID) | SUBCUTANEOUS | Status: DC
  Administered 2015-11-12 (×2): 55 mg via SUBCUTANEOUS
  Filled 2015-11-12 (×4): qty 0.6

## 2015-11-12 MED ORDER — PANTOPRAZOLE SODIUM 40 MG PO TBEC
40.0000 mg | DELAYED_RELEASE_TABLET | Freq: Every day | ORAL | Status: DC
  Administered 2015-11-12 – 2015-11-13 (×2): 40 mg via ORAL
  Filled 2015-11-12 (×2): qty 1

## 2015-11-12 MED ORDER — TIOTROPIUM BROMIDE MONOHYDRATE 18 MCG IN CAPS
18.0000 ug | ORAL_CAPSULE | Freq: Every day | RESPIRATORY_TRACT | Status: DC
  Administered 2015-11-12 – 2015-11-13 (×2): 18 ug via RESPIRATORY_TRACT
  Filled 2015-11-12: qty 5

## 2015-11-12 MED ORDER — ONDANSETRON HCL 4 MG/2ML IJ SOLN: 4.0000 mg | Freq: Four times a day (QID) | INTRAMUSCULAR | Status: DC | PRN

## 2015-11-12 MED ORDER — VITAMIN D 1000 UNITS PO TABS
2000.0000 [IU] | ORAL_TABLET | Freq: Every day | ORAL | Status: DC
  Administered 2015-11-12 – 2015-11-13 (×2): 2000 [IU] via ORAL
  Filled 2015-11-12 (×2): qty 2

## 2015-11-12 MED ORDER — DABIGATRAN ETEXILATE MESYLATE 150 MG PO CAPS
150.0000 mg | ORAL_CAPSULE | Freq: Two times a day (BID) | ORAL | Status: DC
  Filled 2015-11-12 (×2): qty 1

## 2015-11-12 MED ORDER — CLOBETASOL PROPIONATE 0.05 % EX OINT
1.0000 "application " | TOPICAL_OINTMENT | CUTANEOUS | Status: DC
  Filled 2015-11-12: qty 15

## 2015-11-12 MED ORDER — SODIUM CHLORIDE 0.9 % IV SOLN
INTRAVENOUS | Status: DC
  Administered 2015-11-12: 06:00:00 via INTRAVENOUS

## 2015-11-12 MED ORDER — GABAPENTIN 400 MG PO CAPS
1200.0000 mg | ORAL_CAPSULE | Freq: Two times a day (BID) | ORAL | Status: DC
  Administered 2015-11-12 – 2015-11-13 (×3): 1200 mg via ORAL
  Filled 2015-11-12 (×3): qty 3

## 2015-11-12 MED ORDER — METOPROLOL TARTRATE 25 MG PO TABS: 12.5000 mg | ORAL_TABLET | Freq: Every day | ORAL | Status: DC

## 2015-11-12 MED ORDER — LEVOFLOXACIN IN D5W 500 MG/100ML IV SOLN
500.0000 mg | Freq: Once | INTRAVENOUS | Status: AC
  Administered 2015-11-12: 500 mg via INTRAVENOUS
  Filled 2015-11-12: qty 100

## 2015-11-12 MED ORDER — LEVOFLOXACIN IN D5W 250 MG/50ML IV SOLN
250.0000 mg | INTRAVENOUS | Status: DC
  Filled 2015-11-12: qty 50

## 2015-11-12 MED ORDER — NORTRIPTYLINE HCL 10 MG PO CAPS
20.0000 mg | ORAL_CAPSULE | Freq: Every day | ORAL | Status: DC
  Administered 2015-11-12: 20:00:00 20 mg via ORAL
  Filled 2015-11-12 (×2): qty 2

## 2015-11-12 MED ORDER — FLECAINIDE ACETATE 50 MG PO TABS
75.0000 mg | ORAL_TABLET | Freq: Two times a day (BID) | ORAL | Status: DC
  Administered 2015-11-12 – 2015-11-13 (×3): 75 mg via ORAL
  Filled 2015-11-12 (×4): qty 2

## 2015-11-12 MED ORDER — METOPROLOL SUCCINATE ER 25 MG PO TB24
12.5000 mg | ORAL_TABLET | Freq: Every day | ORAL | Status: DC
  Administered 2015-11-12 – 2015-11-13 (×2): 12.5 mg via ORAL
  Filled 2015-11-12 (×2): qty 1

## 2015-11-12 MED ORDER — LEVOFLOXACIN 250 MG PO TABS
250.0000 mg | ORAL_TABLET | Freq: Every day | ORAL | Status: DC
  Administered 2015-11-12: 19:00:00 250 mg via ORAL
  Filled 2015-11-12: qty 1

## 2015-11-12 MED ORDER — ALPRAZOLAM 0.25 MG PO TABS: 0.2500 mg | ORAL_TABLET | Freq: Every day | ORAL | Status: DC | PRN

## 2015-11-12 MED ORDER — LEVALBUTEROL HCL 0.63 MG/3ML IN NEBU: 0.6300 mg | INHALATION_SOLUTION | RESPIRATORY_TRACT | Status: DC | PRN

## 2015-11-12 MED ORDER — CYANOCOBALAMIN 1000 MCG/ML IJ SOLN: 1000.0000 ug | INTRAMUSCULAR | Status: DC

## 2015-11-12 MED ORDER — IOHEXOL 350 MG/ML SOLN
75.0000 mL | Freq: Once | INTRAVENOUS | Status: AC | PRN
  Administered 2015-11-12: 75 mL via INTRAVENOUS

## 2015-11-12 NOTE — Progress Notes (Signed)
Date: 11/12/2015,   MRN# [id number] Cheryl Cannon 1936/01/19 Code Status:     Code Status Orders        Start     Ordered   11/12/15 0547  Full code   Continuous     11/12/15 0546    Advance Directive Documentation        Most Recent Value   Type of Advance Directive  Healthcare Power of [place], Living will [Cheryl Cannon (Husband)]   Pre-existing out of facility DNR order (yellow form or pink MOST form)     "MOST" Form in Place?       Hosp day:'@LENGTHOFSTAYDAYS'$ @ Referring MD: '@ATDPROV'$ @        AdmissionWeight: 124 lb 14.4 oz (56.654 kg)                 CurrentWeight: 124 lb 14.4 oz (56.654 kg)  CC: sob, right pleural effusion  HPI: This is a 79 yr old lady . A patient of mine, known to have copd, s/p RML and RLL resection for squamous cell cancer. Per sniff test her right diaphragm is not paralyzed but weak. He she has had chronic dyspnea on exertion. She is an independent resident at the [facility]. She came here with more shortness of breath, the need for oxygen and on w/u right single pulmonary embolus and ? Pneumonia. Presently no worse. Asked to evaluate course of action.   PMHX:   Past Medical History  Diagnosis Date  . Lung cancer ([place])   . A-fib ([place])   . COPD (chronic obstructive pulmonary disease) ([place])    Surgical Hx:  Past Surgical History  Procedure Laterality Date  . Lung cancer surgery    . Pacemaker placement     Family Hx:  Family History  Problem Relation Age of Onset  . Cancer Mother   . Cancer Sister    Social Hx:   Social History  Substance Use Topics  . Smoking status: Former Research scientist (life sciences)  . Smokeless tobacco: None  . Alcohol Use: Yes   Medication:    Home Medication:  No current outpatient prescriptions on file.  Current Medication: '@CURMEDTAB'$ @   Allergies:  Gluten meal and Parabens  Review of Systems: Gen:  Denies  fever, sweats, chills HEENT: Denies blurred vision, double vision, ear pain, eye pain, hearing loss, nose  bleeds, sore throat Cvc:  No dizziness, chest pain or heaviness Resp:  Sob, minimum cough, no hemoptysis  Gi: Denies swallowing difficulty, stomach pain, nausea or vomiting, diarrhea, constipation, bowel incontinence Gu:  Denies bladder incontinence, burning urine Ext:   No Joint pain, stiffness or swelling Skin: No skin rash, easy bruising or bleeding or hives Endoc:  No polyuria, polydipsia , polyphagia or weight change Psych: No depression, insomnia or hallucinations  Other:  All other systems negative  Physical Examination:   VS: BP 128/47 mmHg  Pulse 62  Temp(Src) 97.5 F (36.4 C) (Oral)  Resp 20  Ht '5\' 2"'$  (1.575 m)  Wt 124 lb 14.4 oz (56.654 kg)  BMI 22.84 kg/m2  SpO2 96%  General Appearance: No distress  Neuro: without focal findings, mental status, speech normal, alert and oriented, cranial nerves 2-12 intact, reflexes normal and symmetric, sensation grossly normal  HEENT: PERRLA, EOM intact, no ptosis, no other lesions noticed, Mallampati: Pulmonary:.No wheezing, No rales  Sputum Production:   Cardiovascular:  Normal S1,S2.  No m/r/g.  Abdominal aorta pulsation normal.    Abdomen:Benign, Soft, non-tender, No masses, hepatosplenomegaly, No lymphadenopathy Endoc: No evident  thyromegaly, no signs of acromegaly or Cushing features Skin:   warm, no rashes, no ecchymosis  Extremities: normal, no cyanosis, clubbing, no edema, warm with normal capillary refill. Other findings:   Labs results:   Recent Labs     11/10/15  0530  11/10/15  1100  11/11/15  2352  HGB   --   10.9*  12.1  HCT   --   33.0*  36.5  MCV   --   106.7*  107.5*  WBC   --   5.3  7.1  BUN  7   --   12  CREATININE  0.85   --   0.86  GLUCOSE  86   --   132*  CALCIUM  8.5*   --   9.3  ,     Rad results:   Dg Chest 2 View  11/12/2015  CLINICAL DATA:  Shortness of breath for 1 day. History of lung cancer, pneumonia, COPD. EXAM: CHEST  2 VIEW COMPARISON:  None available for comparison at time of  study interpretation. FINDINGS: Moderate RIGHT pleural effusion with underlying consolidation and volume loss/atelectasis. Cardiomediastinal silhouette is slightly shifted to the RIGHT, overall normal in size. Mildly calcified aortic knob. Dual lead LEFT cardiac pacemaker with lead projecting RIGHT atrium and RIGHT ventricle. RIGHT apical pleural thickening. No pneumothorax. Crescentic calcification inferior to the LEFT glenohumeral joint could represent loose body or degenerative change. Upper lumbar levoscoliosis. IMPRESSION: Moderate RIGHT pleural effusion with underlying consolidation. LEFT lung base atelectasis/ scarring. Recommend follow-up chest radiograph after treatment to exclude underlying mass. Electronically Signed   By: Elon Alas M.D.   On: 11/12/2015 00:26   Ct Angio Chest Pe W/cm &/or Wo Cm  11/12/2015  CLINICAL DATA:  Shortness of breath. History of lung cancer. Recent lung infection. EXAM: CT ANGIOGRAPHY CHEST WITH CONTRAST TECHNIQUE: Multidetector CT imaging of the chest was performed using the standard protocol during bolus administration of intravenous contrast. Multiplanar CT image reconstructions and MIPs were obtained to evaluate the vascular anatomy. CONTRAST:  41m OMNIPAQUE IOHEXOL 350 MG/ML SOLN COMPARISON:  None. FINDINGS: THORACIC INLET/BODY WALL: Dual-chamber pacer from the left with leads in unremarkable position. Dermal inclusion cyst in the right axilla. Mild anasarca. MEDIASTINUM: Borderline cardiomegaly. No significant pericardial effusion. Intrahepatic reflux of right heart contrast suggesting elevated pressures. Essentially non-opacified aorta due to contrast timing. No evidence of acute aortic syndrome. Nonocclusive filling defect within subsegmental right upper lobe branch on series 9, image 93. When allowing for motion at the level of the anterior basilar segment vessels on the left, there is no convincing other pulmonary embolism. Given solitary small embolus,  acute right heart strain is not considered. LUNG WINDOWS: There is nodular interstitial thickening and airspace opacity in the right basilar lung neighboring postoperative changes of lower lobectomy. It is unclear if this is pneumonia, radiation changes, or lymphangitic tumor. There is associated pleural thickening which could be treatment related scarring. Small layering left pleural effusion. Suspect early interstitial edema with septal thickening at the bases. Emphysema, panlobular at the apices. There are patchy ground-glass opacities in the left upper lobe measuring 2 cm or less, chronicity indeterminate without comparison. Presumed surveillance in this patient with history of lung cancer. UPPER ABDOMEN: No acute finding.  Negative adrenals. OSSEOUS: No acute fracture.  No suspicious lytic or blastic lesions. These results were called by telephone at the time of interpretation on 11/12/2015 at 2:49 am to Dr. RMarjean Donna, who verbally acknowledged these results. Review of  the MIP images confirms the above findings. IMPRESSION: 1. Single subsegmental pulmonary embolism to the right upper lobe, recent appearing. 2. Airspace disease and pleural thickening at the right base neighboring lower lobectomy changes. Without CT comparison, pneumonia, radiation change, or lymphangitic tumor cannot be distinguished - but appearance on esophagram July 2016 favors pneumonia. Recommend follow-up with CT surgeon for radiographic correlation. 3. Small left pleural effusion and probable developing pulmonary edema. 4. Emphysema. Electronically Signed   By: Monte Fantasia M.D.   On: 11/12/2015 02:54     Assessment and Plan: Here wirth a right pleural effusion ? Etiology -right lateral decubitus film -if free flowing diagnostic thoracentesis recommended  Single subsegmental pulmonary embolism to RUL -lower extremity doppler -eliquis  Airspace disease noted on ct ? Pneumonia, ??? Lymphogenic spread -treat for cap   -repeat chest ct in 2-3 months   Emphysema -spiriva and albuterol -out patient pfts   I have personally obtained a history, examined the patient, evaluated laboratory and imaging results, formulated the assessment and plan and placed orders.  The Patient requires high complexity decision making for assessment and support, frequent evaluation and titration of therapies, application of advanced monitoring technologies and extensive interpretation of multiple databases.   Herbon Fleming,M.D. Pulmonary & Critical care Medicine [facility]

## 2015-11-12 NOTE — H&P (Signed)
Cheryl Cannon is an 79 y.o. female.   Chief Complaint: Shortness of breath HPI: The patient presents emergency department from the independent section of the [facility] complaining of shortness of breath. She had been receiving amoxicillin for the last week due to an upper respiratory infection. The patient had also been on supplemental oxygen as needed while in the skilled nursing portion of the [facility] [employer] facility. Last night she states that the oxygen per nasal cannula was not helping. In the emergency department chest x-ray showed right lower lobe pneumonia as well as effusion. CT angiogram of the chest also showed a pulmonary embolus that is new from prior study. Due to her increasing oxygen requirement and failed outpatient treatment of pneumonia the emergency department called the hospitalist service for admission.  Past Medical History  Diagnosis Date  . Lung cancer   . A-fib   . COPD (chronic obstructive pulmonary disease)     Past Surgical History  Procedure Laterality Date  . Lung cancer surgery    . Pacemaker placement      Family History  Problem Relation Age of Onset  . Cancer Mother   . Cancer Sister    Social History:  reports that she has quit smoking. She does not have any smokeless tobacco history on file. She reports that she drinks alcohol. She reports that she does not use illicit drugs.  Allergies:  Allergies  Allergen Reactions  . Gluten Meal Nausea And Vomiting  . Parabens Rash    Medications Prior to Admission  Medication Sig Dispense Refill  . ALPRAZolam (XANAX) 0.25 MG tablet Take 0.25 mg by mouth daily as needed for anxiety.     . cholecalciferol (VITAMIN D) 1000 UNITS tablet Take 2,000 Units by mouth daily.     . clobetasol ointment (TEMOVATE) 9.37 % Apply 1 application topically 3 (three) times a week. Uses Mon, Wed, Fri    . cyanocobalamin (,VITAMIN B-12,) 1000 MCG/ML injection Inject 1,000 mcg into the muscle every 30 (thirty) days.  Injection once a month    . dabigatran (PRADAXA) 150 MG CAPS capsule Take 150 mg by mouth 2 (two) times daily.    Marland Kitchen donepezil (ARICEPT) 10 MG tablet Take 1 tablet (10 mg total) by mouth at bedtime. 90 tablet 3  . flecainide (TAMBOCOR) 50 MG tablet Take 75 mg by mouth 2 (two) times daily.    . furosemide (LASIX) 20 MG tablet Take 20 mg by mouth daily as needed for fluid or edema.     . gabapentin (NEURONTIN) 300 MG capsule Take 4 capsules in the morning and 4 capsules in the evening (Patient taking differently: Take 1,200 mg by mouth 2 (two) times daily. ) 720 capsule 2  . lactulose (CHRONULAC) 10 GM/15ML solution Take 10 g by mouth daily as needed for mild constipation.    Marland Kitchen levalbuterol (XOPENEX HFA) 45 MCG/ACT inhaler Inhale 2 puffs into the lungs every 4 (four) hours as needed for wheezing.    . metoprolol tartrate (LOPRESSOR) 25 MG tablet Take 12.5 mg by mouth daily.     . nortriptyline (PAMELOR) 10 MG capsule Take 1 capsule at bedtime for 1 week, then increase to 2 capsules at night (Patient taking differently: Take 20 mg by mouth at bedtime. ) 180 capsule 3  . pantoprazole (PROTONIX) 40 MG tablet Take 40 mg by mouth daily.     Marland Kitchen tiotropium (SPIRIVA) 18 MCG inhalation capsule Place 18 mcg into inhaler and inhale daily.  Results for orders placed or performed during the hospital encounter of 11/11/15 (from the past 48 hour(s))  Comprehensive metabolic panel     Status: Abnormal   Collection Time: 11/11/15 11:52 PM  Result Value Ref Range   Sodium 136 135 - 145 mmol/L   Potassium 4.4 3.5 - 5.1 mmol/L    Comment: HEMOLYSIS AT THIS LEVEL MAY AFFECT RESULT   Chloride 103 101 - 111 mmol/L   CO2 27 22 - 32 mmol/L   Glucose, Bld 132 (H) 65 - 99 mg/dL   BUN 12 6 - 20 mg/dL   Creatinine, Ser 0.86 0.44 - 1.00 mg/dL   Calcium 9.3 8.9 - 10.3 mg/dL   Total Protein 7.2 6.5 - 8.1 g/dL   Albumin 4.0 3.5 - 5.0 g/dL   AST 45 (H) 15 - 41 U/L   ALT 26 14 - 54 U/L   Alkaline Phosphatase 106 38 -  126 U/L   Total Bilirubin 1.4 (H) 0.3 - 1.2 mg/dL   GFR calc non Af Amer >60 >60 mL/min   GFR calc Af Amer >60 >60 mL/min    Comment: (NOTE) The eGFR has been calculated using the CKD EPI equation. This calculation has not been validated in all clinical situations. eGFR's persistently <60 mL/min signify possible Chronic Kidney Disease.    Anion gap 6 5 - 15  CBC with Differential     Status: Abnormal   Collection Time: 11/11/15 11:52 PM  Result Value Ref Range   WBC 7.1 3.6 - 11.0 K/uL   RBC 3.40 (L) 3.80 - 5.20 MIL/uL   Hemoglobin 12.1 12.0 - 16.0 g/dL   HCT 36.5 35.0 - 47.0 %   MCV 107.5 (H) 80.0 - 100.0 fL   MCH 35.5 (H) 26.0 - 34.0 pg   MCHC 33.1 32.0 - 36.0 g/dL   RDW 15.8 (H) 11.5 - 14.5 %   Platelets 157 150 - 440 K/uL   Neutrophils Relative % 80 %   Neutro Abs 5.7 1.4 - 6.5 K/uL   Lymphocytes Relative 9 %   Lymphs Abs 0.6 (L) 1.0 - 3.6 K/uL   Monocytes Relative 9 %   Monocytes Absolute 0.6 0.2 - 0.9 K/uL   Eosinophils Relative 2 %   Eosinophils Absolute 0.1 0 - 0.7 K/uL   Basophils Relative 0 %   Basophils Absolute 0.0 0 - 0.1 K/uL   Dg Chest 2 View  11/12/2015  CLINICAL DATA:  Shortness of breath for 1 day. History of lung cancer, pneumonia, COPD. EXAM: CHEST  2 VIEW COMPARISON:  None available for comparison at time of study interpretation. FINDINGS: Moderate RIGHT pleural effusion with underlying consolidation and volume loss/atelectasis. Cardiomediastinal silhouette is slightly shifted to the RIGHT, overall normal in size. Mildly calcified aortic knob. Dual lead LEFT cardiac pacemaker with lead projecting RIGHT atrium and RIGHT ventricle. RIGHT apical pleural thickening. No pneumothorax. Crescentic calcification inferior to the LEFT glenohumeral joint could represent loose body or degenerative change. Upper lumbar levoscoliosis. IMPRESSION: Moderate RIGHT pleural effusion with underlying consolidation. LEFT lung base atelectasis/ scarring. Recommend follow-up chest  radiograph after treatment to exclude underlying mass. Electronically Signed   By: Elon Alas M.D.   On: 11/12/2015 00:26   Ct Angio Chest Pe W/cm &/or Wo Cm  11/12/2015  CLINICAL DATA:  Shortness of breath. History of lung cancer. Recent lung infection. EXAM: CT ANGIOGRAPHY CHEST WITH CONTRAST TECHNIQUE: Multidetector CT imaging of the chest was performed using the standard protocol during bolus administration of intravenous  contrast. Multiplanar CT image reconstructions and MIPs were obtained to evaluate the vascular anatomy. CONTRAST:  45m OMNIPAQUE IOHEXOL 350 MG/ML SOLN COMPARISON:  None. FINDINGS: THORACIC INLET/BODY WALL: Dual-chamber pacer from the left with leads in unremarkable position. Dermal inclusion cyst in the right axilla. Mild anasarca. MEDIASTINUM: Borderline cardiomegaly. No significant pericardial effusion. Intrahepatic reflux of right heart contrast suggesting elevated pressures. Essentially non-opacified aorta due to contrast timing. No evidence of acute aortic syndrome. Nonocclusive filling defect within subsegmental right upper lobe branch on series 9, image 93. When allowing for motion at the level of the anterior basilar segment vessels on the left, there is no convincing other pulmonary embolism. Given solitary small embolus, acute right heart strain is not considered. LUNG WINDOWS: There is nodular interstitial thickening and airspace opacity in the right basilar lung neighboring postoperative changes of lower lobectomy. It is unclear if this is pneumonia, radiation changes, or lymphangitic tumor. There is associated pleural thickening which could be treatment related scarring. Small layering left pleural effusion. Suspect early interstitial edema with septal thickening at the bases. Emphysema, panlobular at the apices. There are patchy ground-glass opacities in the left upper lobe measuring 2 cm or less, chronicity indeterminate without comparison. Presumed surveillance in  this patient with history of lung cancer. UPPER ABDOMEN: No acute finding.  Negative adrenals. OSSEOUS: No acute fracture.  No suspicious lytic or blastic lesions. These results were called by telephone at the time of interpretation on 11/12/2015 at 2:49 am to Dr. RMarjean Donna, who verbally acknowledged these results. Review of the MIP images confirms the above findings. IMPRESSION: 1. Single subsegmental pulmonary embolism to the right upper lobe, recent appearing. 2. Airspace disease and pleural thickening at the right base neighboring lower lobectomy changes. Without CT comparison, pneumonia, radiation change, or lymphangitic tumor cannot be distinguished - but appearance on esophagram July 2016 favors pneumonia. Recommend follow-up with CT surgeon for radiographic correlation. 3. Small left pleural effusion and probable developing pulmonary edema. 4. Emphysema. Electronically Signed   By: JMonte FantasiaM.D.   On: 11/12/2015 02:54    Review of Systems  Constitutional: Negative for fever and chills.  HENT: Negative for sore throat and tinnitus.   Eyes: Negative for blurred vision and redness.  Respiratory: Positive for cough and shortness of breath.   Cardiovascular: Negative for chest pain, palpitations, orthopnea and PND.  Gastrointestinal: Negative for nausea, vomiting, abdominal pain and diarrhea.  Genitourinary: Negative for dysuria, urgency and frequency.  Musculoskeletal: Negative for myalgias and joint pain.  Skin: Negative for rash.       No lesions  Neurological: Negative for speech change, focal weakness and weakness.  Endo/Heme/Allergies: Does not bruise/bleed easily.       No temperature intolerance  Psychiatric/Behavioral: Negative for depression and suicidal ideas.    Blood pressure 134/61, pulse 56, temperature 98.3 F (36.8 C), temperature source Oral, resp. rate 26, SpO2 99 %. Physical Exam  Nursing note and vitals reviewed. Constitutional: She is oriented to person,  place, and time. She appears well-developed and well-nourished. No distress.  HENT:  Head: Normocephalic and atraumatic.  Mouth/Throat: Oropharynx is clear and moist.  Eyes: Conjunctivae and EOM are normal. Pupils are equal, round, and reactive to light. No scleral icterus.  Neck: Normal range of motion. Neck supple. No JVD present. No tracheal deviation present. No thyromegaly present.  Cardiovascular: Normal rate, regular rhythm and normal heart sounds.  Exam reveals no gallop and no friction rub.   No murmur heard. Respiratory: Effort  normal. She has decreased breath sounds in the right lower field.  GI: Soft. Bowel sounds are normal. She exhibits no distension. There is no tenderness.  Genitourinary:  Deferred  Musculoskeletal: Normal range of motion. She exhibits no edema.  Lymphadenopathy:    She has no cervical adenopathy.  Neurological: She is alert and oriented to person, place, and time. No cranial nerve deficit. She exhibits normal muscle tone.  Skin: Skin is warm and dry. No rash noted. No erythema.  Psychiatric: She has a normal mood and affect. Her behavior is normal. Judgment and thought content normal.     Assessment/Plan This is a 79 year old Caucasian female admitted for failed outpatient treatment of pneumonia. 1. Pneumonia: Healthcare associated. The patient is received Levaquin in the emergency department. I will also start vancomycin as the patient had previously been in a nursing facility and could potentially be exposed to multidrug resistant organisms. Continue full mental oxygen as needed. She is currently on 3 L of oxygen via nasal cannula and appears short of breath when speaking. 2. Pleural effusion: Moderate in size; likely contributing to hypoxia. Likely associated with pneumonia and no indication from radiology impression that it may be empyema however we may want to clarify this with ultrasound as the patient started been exposed to radiation from CT scan and  x-ray. Consult pulmonology for recommendation regarding possible thoracentesis especially given history of lung cancer. Differential diagnosis includes malignant effusion. Continue Lasix per home regimen (possibly originally prescribed for mild CHF) 3. Pulmonary embolism: New since previous study. She has developed this embolism while on Pradaxa. Very small in size; unlikely to cause right heart strain. No evidence of cor pulmonale. 4. Atrial fibrillation: Paroxysmal; junctional rhythm seen on current EKG. Rate controlled. Continue flecainide and metoprolol 5. COPD: Emphysema; continue Spiriva and levalbuterol 6. Mild cognitive impairment: Continue Aricept 7. Vitamin D deficiency: continue supplementation 8. DVT prophylaxis: Anticoagulation as above 9. GI prophylaxis: Marcello Moores per home regimen The patient is a full code. Time spent on admission orders and patient care approximately 45 minutes  Harrie Foreman 11/12/2015, 5:52 AM

## 2015-11-12 NOTE — Progress Notes (Signed)
ANTICOAGULATION CONSULT NOTE - Initial Consult  Pharmacy Consult for Lovenox Dosing  Indication: pulmonary embolus  Allergies  Allergen Reactions  . Gluten Meal Nausea And Vomiting  . Parabens Rash    Patient Measurements: Height: '5\' 2"'$  (157.5 cm) Weight: 124 lb 14.4 oz (56.654 kg) IBW/kg (Calculated) : 50.1   Vital Signs: Temp: 97.5 F (36.4 C) (12/21 1138) Temp Source: Oral (12/21 1138) BP: 128/47 mmHg (12/21 1138) Pulse Rate: 62 (12/21 1138)  Labs:  Recent Labs  11/10/15 0530 11/10/15 1100 11/11/15 2352  HGB  --  10.9* 12.1  HCT  --  33.0* 36.5  PLT  --  128* 157  CREATININE 0.85  --  0.86    Estimated Creatinine Clearance: 43.3 mL/min (by C-G formula based on Cr of 0.86).   Medical History: Past Medical History  Diagnosis Date  . Lung cancer ([place])   . A-fib ([place])   . COPD (chronic obstructive pulmonary disease) ([facility])     Medications:  Scheduled:  . cholecalciferol  2,000 Units Oral Daily  . clobetasol ointment  1 application Topical Once per day on Mon Wed Fri  . cyanocobalamin  1,000 mcg Intramuscular Q30 days  . donepezil  10 mg Oral QHS  . enoxaparin (LOVENOX) injection  1 mg/kg Subcutaneous Q12H  . flecainide  75 mg Oral BID  . gabapentin  1,200 mg Oral BID  . levofloxacin (LEVAQUIN) IV  250 mg Intravenous Q24H  . metoprolol succinate  12.5 mg Oral Daily  . nortriptyline  20 mg Oral QHS  . pantoprazole  40 mg Oral Daily  . sodium chloride  3 mL Intravenous Q12H  . tiotropium  18 mcg Inhalation Daily    Assessment: Pharmacy consulted to dose Lovenox in a 79 yo female with PE.  Patient previously on Pradaxa 150 mg po BID for afib.  Per RN, patient's last dose of Pradaxa at 2000 on 12/20.  Est CrCl~43.3 mL/min, plts: 157, Hgb: 12.1  Goal of Therapy:  Monitor SCr and CBC per policy.   Plan:  Will start Lovenox 1 mg/kg subq q12h for PE treatment based on CrCl>30 mL/min.  Will start dose at 1030 as last dose of Pradaxa was ~12 hours ago.     Pharmacy will continue to follow and monitor.   Janin Kozlowski G 11/12/2015,1:39 PM

## 2015-11-12 NOTE — Consult Note (Signed)
[facility] Cardiology  CARDIOLOGY CONSULT NOTE  Patient ID: Cheryl Cannon MRN: [id number] DOB/AGE: 77-Sep-1937 77 y.o.  Admit date: 11/11/2015 Referring Physician Volanda Napoleon Primary Physician Candiss Norse Primary Cardiologist Nehemiah Massed Reason for Consultation atrial fibrillation  HPI: 79 year old female referred for evaluation for anticoagulation for atrial fibrillation. The patient has a history of paroxysmal atrial fibrillation, on Pradaxa for stroke prevention. The patient has a history of COPD, sent to [facility] emergency room with shortness of breath, chest x-ray revealing right lower lobe pneumonia, CT scan revealing small right sided segmental embolus. Patient denies chest pain. She has chronic exertional dyspnea. She denies palpitations or heart racing. She denies peripheral edema.  Review of systems complete and found to be negative unless listed above     Past Medical History  Diagnosis Date  . Lung cancer ([place])   . A-fib ([place])   . COPD (chronic obstructive pulmonary disease) [facility])     Past Surgical History  Procedure Laterality Date  . Lung cancer surgery    . Pacemaker placement      Prescriptions prior to admission  Medication Sig Dispense Refill Last Dose  . ALPRAZolam (XANAX) 0.25 MG tablet Take 0.25 mg by mouth daily as needed for anxiety.    prn at prn  . cholecalciferol (VITAMIN D) 1000 UNITS tablet Take 2,000 Units by mouth daily.    11/11/2015 at Unknown time  . clobetasol ointment (TEMOVATE) 2.59 % Apply 1 application topically 3 (three) times a week. Uses Mon, Wed, Fri   Past Month at Unknown time  . cyanocobalamin (,VITAMIN B-12,) 1000 MCG/ML injection Inject 1,000 mcg into the muscle every 30 (thirty) days. Injection once a month   Past Month at Unknown time  . dabigatran (PRADAXA) 150 MG CAPS capsule Take 150 mg by mouth 2 (two) times daily.   11/11/2015 at Unknown time  . donepezil (ARICEPT) 10 MG tablet Take 1 tablet (10 mg total) by mouth at bedtime. 90 tablet 3 11/11/2015 at  Unknown time  . flecainide (TAMBOCOR) 50 MG tablet Take 75 mg by mouth 2 (two) times daily.   11/11/2015 at Unknown time  . furosemide (LASIX) 20 MG tablet Take 20 mg by mouth daily as needed for fluid or edema.    prn at prn  . gabapentin (NEURONTIN) 300 MG capsule Take 4 capsules in the morning and 4 capsules in the evening (Patient taking differently: Take 1,200 mg by mouth 2 (two) times daily. ) 720 capsule 2 11/11/2015 at Unknown time  . lactulose (CHRONULAC) 10 GM/15ML solution Take 10 g by mouth daily as needed for mild constipation.   prn at prn  . levalbuterol (XOPENEX HFA) 45 MCG/ACT inhaler Inhale 2 puffs into the lungs every 4 (four) hours as needed for wheezing.   prn at prn  . metoprolol tartrate (LOPRESSOR) 25 MG tablet Take 12.5 mg by mouth daily.    11/11/2015 at 2100  . nortriptyline (PAMELOR) 10 MG capsule Take 1 capsule at bedtime for 1 week, then increase to 2 capsules at night (Patient taking differently: Take 20 mg by mouth at bedtime. ) 180 capsule 3 11/11/2015 at Unknown time  . pantoprazole (PROTONIX) 40 MG tablet Take 40 mg by mouth daily.    11/11/2015 at Unknown time  . tiotropium (SPIRIVA) 18 MCG inhalation capsule Place 18 mcg into inhaler and inhale daily.    11/11/2015 at Unknown time   Social History   Social History  . Marital Status: Married    Spouse Name: N/A  . Number  of Children: N/A  . Years of Education: N/A   Occupational History  . Not on file.   Social History Main Topics  . Smoking status: Former Research scientist (life sciences)  . Smokeless tobacco: Not on file  . Alcohol Use: Yes  . Drug Use: No  . Sexual Activity: Not on file   Other Topics Concern  . Not on file   Social History Narrative    Family History  Problem Relation Age of Onset  . Cancer Mother   . Cancer Sister       Review of systems complete and found to be negative unless listed above      PHYSICAL EXAM  General: Well developed, well nourished, in no acute distress HEENT:   Normocephalic and atramatic Neck:  No JVD.  Lungs: Clear bilaterally to auscultation and percussion. Heart: HRRR . Normal S1 and S2 without gallops or murmurs.  Abdomen: Bowel sounds are positive, abdomen soft and non-tender  Msk:  Back normal, normal gait. Normal strength and tone for age. Extremities: No clubbing, cyanosis or edema.   Neuro: Alert and oriented X 3. Psych:  Good affect, responds appropriately  Labs:   Lab Results  Component Value Date   WBC 7.1 11/11/2015   HGB 12.1 11/11/2015   HCT 36.5 11/11/2015   MCV 107.5* 11/11/2015   PLT 157 11/11/2015    Recent Labs Lab 11/11/15 2352  NA 136  K 4.4  CL 103  CO2 27  BUN 12  CREATININE 0.86  CALCIUM 9.3  PROT 7.2  BILITOT 1.4*  ALKPHOS 106  ALT 26  AST 45*  GLUCOSE 132*   No results found for: CKTOTAL, CKMB, CKMBINDEX, TROPONINI No results found for: CHOL No results found for: HDL No results found for: LDLCALC No results found for: TRIG No results found for: CHOLHDL No results found for: LDLDIRECT    Radiology: Dg Chest 2 View  11/12/2015  CLINICAL DATA:  Shortness of breath for 1 day. History of lung cancer, pneumonia, COPD. EXAM: CHEST  2 VIEW COMPARISON:  None available for comparison at time of study interpretation. FINDINGS: Moderate RIGHT pleural effusion with underlying consolidation and volume loss/atelectasis. Cardiomediastinal silhouette is slightly shifted to the RIGHT, overall normal in size. Mildly calcified aortic knob. Dual lead LEFT cardiac pacemaker with lead projecting RIGHT atrium and RIGHT ventricle. RIGHT apical pleural thickening. No pneumothorax. Crescentic calcification inferior to the LEFT glenohumeral joint could represent loose body or degenerative change. Upper lumbar levoscoliosis. IMPRESSION: Moderate RIGHT pleural effusion with underlying consolidation. LEFT lung base atelectasis/ scarring. Recommend follow-up chest radiograph after treatment to exclude underlying mass.  Electronically Signed   By: Elon Alas M.D.   On: 11/12/2015 00:26   Ct Angio Chest Pe W/cm &/or Wo Cm  11/12/2015  CLINICAL DATA:  Shortness of breath. History of lung cancer. Recent lung infection. EXAM: CT ANGIOGRAPHY CHEST WITH CONTRAST TECHNIQUE: Multidetector CT imaging of the chest was performed using the standard protocol during bolus administration of intravenous contrast. Multiplanar CT image reconstructions and MIPs were obtained to evaluate the vascular anatomy. CONTRAST:  27m OMNIPAQUE IOHEXOL 350 MG/ML SOLN COMPARISON:  None. FINDINGS: THORACIC INLET/BODY WALL: Dual-chamber pacer from the left with leads in unremarkable position. Dermal inclusion cyst in the right axilla. Mild anasarca. MEDIASTINUM: Borderline cardiomegaly. No significant pericardial effusion. Intrahepatic reflux of right heart contrast suggesting elevated pressures. Essentially non-opacified aorta due to contrast timing. No evidence of acute aortic syndrome. Nonocclusive filling defect within subsegmental right upper lobe branch on series  9, image 93. When allowing for motion at the level of the anterior basilar segment vessels on the left, there is no convincing other pulmonary embolism. Given solitary small embolus, acute right heart strain is not considered. LUNG WINDOWS: There is nodular interstitial thickening and airspace opacity in the right basilar lung neighboring postoperative changes of lower lobectomy. It is unclear if this is pneumonia, radiation changes, or lymphangitic tumor. There is associated pleural thickening which could be treatment related scarring. Small layering left pleural effusion. Suspect early interstitial edema with septal thickening at the bases. Emphysema, panlobular at the apices. There are patchy ground-glass opacities in the left upper lobe measuring 2 cm or less, chronicity indeterminate without comparison. Presumed surveillance in this patient with history of lung cancer. UPPER ABDOMEN:  No acute finding.  Negative adrenals. OSSEOUS: No acute fracture.  No suspicious lytic or blastic lesions. These results were called by telephone at the time of interpretation on 11/12/2015 at 2:49 am to Dr. Marjean Donna , who verbally acknowledged these results. Review of the MIP images confirms the above findings. IMPRESSION: 1. Single subsegmental pulmonary embolism to the right upper lobe, recent appearing. 2. Airspace disease and pleural thickening at the right base neighboring lower lobectomy changes. Without CT comparison, pneumonia, radiation change, or lymphangitic tumor cannot be distinguished - but appearance on esophagram July 2016 favors pneumonia. Recommend follow-up with CT surgeon for radiographic correlation. 3. Small left pleural effusion and probable developing pulmonary edema. 4. Emphysema. Electronically Signed   By: Monte Fantasia M.D.   On: 11/12/2015 02:54   Dg Chest Right Decubitus  11/12/2015  CLINICAL DATA:  History of right middle/right lower lobe resection for squamous cell lung cancer. Pleural thickening and patchy consolidation at the right lung base on chest CT earlier today. Small left pleural effusion. EXAM: CHEST - RIGHT DECUBITUS COMPARISON:  Chest CT angiogram and chest radiograph from earlier today. FINDINGS: There is stable volume loss in the right hemithorax status post right middle and right lower lobectomies. There is prominent pleural thickening at the right costophrenic angle, which is unchanged on this right lateral decubitus view in comparison to upright view from earlier today, in keeping with right pleural thickening and not a layering right pleural effusion. The small left pleural effusion seen on the chest CT angiogram from earlier today appears to be layering as evidenced by increased depth of the left costophrenic angle on this right lateral decubitus view compared to the upright chest radiograph from earlier today. Stable patchy consolidation at the right  lung base. Stable cardiomediastinal silhouette with mild cardiomegaly. Stable configuration of 2 lead left subclavian pacemaker. No pneumothorax. No overt pulmonary edema. IMPRESSION: 1. No evidence of a layering right pleural effusion. Persistent pleural thickening at the right costophrenic angle, which does not change in configuration on this decubitus view compared to the upright view from earlier today. 2. Small layering left pleural effusion (as better seen on the chest CT angiogram from earlier today). 3. Stable patchy consolidation at the right lung base, nonspecific, likely a pneumonia, cannot exclude lymphangitic tumor. Recommend follow-up PA and lateral post treatment chest radiographs or chest CT in 6-8 weeks. Electronically Signed   By: Ilona Sorrel M.D.   On: 11/12/2015 15:44    EKG: Normal sinus rhythm  ASSESSMENT AND PLAN:   1. Paroxysmal atrial fibrillation, chads Vasc of 2, currently on Pradaxa for stroke prevention, and on flecainide for rhythm control. 2. Right lower lobe pneumonia 3. Right-sided pulmonary embolus, while on Pradaxa.  Patient now being transitioned to Eliquis.   Recommendations  1. Agree with overall current therapy 2. At this point, seems reasonable to consider Pradaxa failure to prevent pulmonary emboli and switch to Eliquis 3. Continue flecainide for rhythm control 4. Review 2-D echocardiogram  Signed: Trev Boley MD,PhD, [facility] 11/12/2015, 5:04 PM

## 2015-11-12 NOTE — Progress Notes (Signed)
[place] [facility] [place] NAME: Cheryl Cannon    MR#:  [id number]  DATE OF BIRTH:  August 27, 1936  SUBJECTIVE:  CHIEF COMPLAINT:   Chief Complaint  Patient presents with  . Shortness of Breath   Breathing more comfortably. No distress. No pain  REVIEW OF SYSTEMS:   Review of Systems  Constitutional: Negative for fever.  Respiratory: Positive for cough and shortness of breath.   Cardiovascular: Positive for leg swelling. Negative for chest pain and palpitations.  Gastrointestinal: Negative for nausea, vomiting and abdominal pain.  Genitourinary: Negative for dysuria.    DRUG ALLERGIES:   Allergies  Allergen Reactions  . Gluten Meal Nausea And Vomiting  . Parabens Rash    VITALS:  Blood pressure 128/47, pulse 62, temperature 97.5 F (36.4 C), temperature source Oral, resp. rate 20, height '5\' 2"'$  (1.575 m), weight 56.654 kg (124 lb 14.4 oz), SpO2 96 %.  PHYSICAL EXAMINATION:  GENERAL:  79 y.o.-year-old patient lying in the bed with no acute distress. Thin LUNGS: Show shallow respirations, fine crackles throughout, no distress  CARDIOVASCULAR: Distant, S1, S2 normal. No murmurs, rubs, or gallops.  ABDOMEN: Soft, nontender, nondistended. Bowel sounds present. No organomegaly or mass.  EXTREMITIES: No pedal edema, cyanosis, or clubbing.  NEUROLOGIC: Cranial nerves II through XII are intact. Muscle strength 5/5 in all extremities. Sensation intact. Gait not checked.  PSYCHIATRIC: The patient is alert and oriented x 3.  SKIN: No obvious rash, lesion, or ulcer.    LABORATORY PANEL:   CBC  Recent Labs Lab 11/11/15 2352  WBC 7.1  HGB 12.1  HCT 36.5  PLT 157   ------------------------------------------------------------------------------------------------------------------  Chemistries   Recent Labs Lab 11/10/15 0530 11/11/15 2352  NA 138 136  K 3.7 4.4  CL 105 103  CO2 28 27  GLUCOSE 86 132*  BUN 7 12  CREATININE 0.85  0.86  CALCIUM 8.5* 9.3  MG 2.0  --   AST 27 45*  ALT 19 26  ALKPHOS 73 106  BILITOT 1.0 1.4*   ------------------------------------------------------------------------------------------------------------------  Cardiac Enzymes No results for input(s): TROPONINI in the last 168 hours. ------------------------------------------------------------------------------------------------------------------  RADIOLOGY:  Dg Chest 2 View  11/12/2015  CLINICAL DATA:  Shortness of breath for 1 day. History of lung cancer, pneumonia, COPD. EXAM: CHEST  2 VIEW COMPARISON:  None available for comparison at time of study interpretation. FINDINGS: Moderate RIGHT pleural effusion with underlying consolidation and volume loss/atelectasis. Cardiomediastinal silhouette is slightly shifted to the RIGHT, overall normal in size. Mildly calcified aortic knob. Dual lead LEFT cardiac pacemaker with lead projecting RIGHT atrium and RIGHT ventricle. RIGHT apical pleural thickening. No pneumothorax. Crescentic calcification inferior to the LEFT glenohumeral joint could represent loose body or degenerative change. Upper lumbar levoscoliosis. IMPRESSION: Moderate RIGHT pleural effusion with underlying consolidation. LEFT lung base atelectasis/ scarring. Recommend follow-up chest radiograph after treatment to exclude underlying mass. Electronically Signed   By: Elon Alas M.D.   On: 11/12/2015 00:26   Ct Angio Chest Pe W/cm &/or Wo Cm  11/12/2015  CLINICAL DATA:  Shortness of breath. History of lung cancer. Recent lung infection. EXAM: CT ANGIOGRAPHY CHEST WITH CONTRAST TECHNIQUE: Multidetector CT imaging of the chest was performed using the standard protocol during bolus administration of intravenous contrast. Multiplanar CT image reconstructions and MIPs were obtained to evaluate the vascular anatomy. CONTRAST:  73m OMNIPAQUE IOHEXOL 350 MG/ML SOLN COMPARISON:  None. FINDINGS: THORACIC INLET/BODY WALL: Dual-chamber pacer  from the left with leads in  unremarkable position. Dermal inclusion cyst in the right axilla. Mild anasarca. MEDIASTINUM: Borderline cardiomegaly. No significant pericardial effusion. Intrahepatic reflux of right heart contrast suggesting elevated pressures. Essentially non-opacified aorta due to contrast timing. No evidence of acute aortic syndrome. Nonocclusive filling defect within subsegmental right upper lobe branch on series 9, image 93. When allowing for motion at the level of the anterior basilar segment vessels on the left, there is no convincing other pulmonary embolism. Given solitary small embolus, acute right heart strain is not considered. LUNG WINDOWS: There is nodular interstitial thickening and airspace opacity in the right basilar lung neighboring postoperative changes of lower lobectomy. It is unclear if this is pneumonia, radiation changes, or lymphangitic tumor. There is associated pleural thickening which could be treatment related scarring. Small layering left pleural effusion. Suspect early interstitial edema with septal thickening at the bases. Emphysema, panlobular at the apices. There are patchy ground-glass opacities in the left upper lobe measuring 2 cm or less, chronicity indeterminate without comparison. Presumed surveillance in this patient with history of lung cancer. UPPER ABDOMEN: No acute finding.  Negative adrenals. OSSEOUS: No acute fracture.  No suspicious lytic or blastic lesions. These results were called by telephone at the time of interpretation on 11/12/2015 at 2:49 am to Dr. Marjean Donna , who verbally acknowledged these results. Review of the MIP images confirms the above findings. IMPRESSION: 1. Single subsegmental pulmonary embolism to the right upper lobe, recent appearing. 2. Airspace disease and pleural thickening at the right base neighboring lower lobectomy changes. Without CT comparison, pneumonia, radiation change, or lymphangitic tumor cannot be distinguished  - but appearance on esophagram July 2016 favors pneumonia. Recommend follow-up with CT surgeon for radiographic correlation. 3. Small left pleural effusion and probable developing pulmonary edema. 4. Emphysema. Electronically Signed   By: Monte Fantasia M.D.   On: 11/12/2015 02:54    EKG:   Orders placed or performed during the hospital encounter of 11/11/15  . EKG 12-Lead  . EKG 12-Lead    ASSESSMENT AND PLAN:   1. Healthcare acquired pneumonia: - She has completed 1 week course of amoxicillin - We'll obtain blood and sputum cultures - Encourage incentive spirometry, sitting up in the bed, bronchodilators - Continue Levaquin  2. Pulmonary embolus - Very small right subsegmental embolus occurred while on Pradaxa - Currently on therapeutic Lovenox - We will transition to a eliquis - Lower extremity Doppler  3. History of lung cancer - Will need repeat chest CT in 2-3 months, will follow with her pulmonologist Dr. Raul Del  4. Emphysema - Continue Spiriva and DuoNeb  5. Atrial fibrillation - Rate controlled continue flecainide, metoprolol  6. Dementia - Continue Aricept  7. Vitamin D deficiency: Continue supplementation  8. GERD: Continue PPI  All the records are reviewed and case discussed with Care Management/Social Workerr. Management plans discussed with the patient, family and they are in agreement.  CODE STATUS: Full   TOTAL TIME TAKING CARE OF THIS PATIENT: 25 minutes.  Greater than 50% of time spent in care coordination and counseling. POSSIBLE D/C IN 1 DAYS, DEPENDING ON CLINICAL CONDITION.   Myrtis Ser M.D on 11/12/2015 at 2:06 PM  Between 7am to 6pm - Pager - [phone]  After 6pm go to [email or web address] - password [email or web address] [facility]  Office  [phone]  CC: Primary care physician; Glendon Axe, MD

## 2015-11-12 NOTE — Care Management Obs Status (Signed)
[place] NOTIFICATION   Patient Details  Name: Cheryl Cannon MRN: [id number] Date of Birth: 17-Dec-1935   Medicare Observation Status Notification Given:  Yes    Shelbie Ammons, RN 11/12/2015, 12:23 PM

## 2015-11-12 NOTE — Progress Notes (Signed)
ANTIBIOTIC CONSULT NOTE - INITIAL  Pharmacy Consult for levofloxacin Indication: pneumonia  Allergies  Allergen Reactions  . Gluten Meal Nausea And Vomiting  . Parabens Rash    Patient Measurements: Weight: 124 lb 14.4 oz (56.654 kg) Adjusted Body Weight: 56.7 kg  Vital Signs: Temp: 97.8 F (36.6 C) (12/21 0552) Temp Source: Oral (12/21 0552) BP: 128/49 mmHg (12/21 0552) Pulse Rate: 63 (12/21 0552) Intake/Output from previous day:   Intake/Output from this shift:    Labs:  Recent Labs  11/10/15 0530 11/10/15 1100 11/11/15 2352  WBC  --  5.3 7.1  HGB  --  10.9* 12.1  PLT  --  128* 157  CREATININE 0.85  --  0.86   Estimated Creatinine Clearance: 42.4 mL/min (by C-G formula based on Cr of 0.86). No results for input(s): VANCOTROUGH, VANCOPEAK, VANCORANDOM, GENTTROUGH, GENTPEAK, GENTRANDOM, TOBRATROUGH, TOBRAPEAK, TOBRARND, AMIKACINPEAK, AMIKACINTROU, AMIKACIN in the last 72 hours.   Microbiology: No results found for this or any previous visit (from the past 720 hour(s)).  Medical History: Past Medical History  Diagnosis Date  . Lung cancer ([place])   . A-fib ([place])   . COPD (chronic obstructive pulmonary disease) ([facility])     Medications:  Infusions:  . sodium chloride     Assessment: 61 yof with hx lung cancer recently diagnosed with PNA and treated outpatient with amoxicillin presents here with continued symptoms. ED starts levofloxacin for CAP.   Goal of Therapy:    Plan:  Expected duration 7 days with resolution of temperature and/or normalization of WBC. Levofloxacin 500 mg IV x 1 administered in ED, continue levofloxacin 250 mg IV Q24H for CAP CrCl 20 to 49 mL/min. Pharmacy will continue to follow.  Laural Benes, Pharm.D., BCPS Clinical Pharmacist 11/12/2015,6:01 AM

## 2015-11-12 NOTE — Plan of Care (Signed)
Problem: Activity: Goal: Ability to tolerate increased activity will improve Outcome: Progressing Pt up  To br with assist. 02 in use steady gait  Problem: Education: Goal: Knowledge of disease or condition will improve Outcome: Progressing Husband at bedside. Reinforce  Information given to pt. Forgetful at times  Problem: Coping: Goal: Verbalizations of decreased anxiety will increase Outcome: Progressing Quiet.   Problem: Respiratory: Goal: Respiratory status will improve Outcome: Progressing No injury this shift. 02 in use. sats maintaining 90/s

## 2015-11-12 NOTE — Progress Notes (Signed)
pcr swab sent to lab this pm. Awaiting results. 02 cont. This is chronic for pt

## 2015-11-13 LAB — BASIC METABOLIC PANEL
ANION GAP: 4 — AB (ref 5–15)
BUN: 10 mg/dL (ref 6–20)
CO2: 26 mmol/L (ref 22–32)
CREATININE: 1.01 mg/dL — AB (ref 0.44–1.00)
Calcium: 8.3 mg/dL — ABNORMAL LOW (ref 8.9–10.3)
Chloride: 102 mmol/L (ref 101–111)
GFR, EST NON AFRICAN AMERICAN: 52 mL/min — AB (ref >60)
Glucose, Bld: 92 mg/dL (ref 65–99)
Potassium: 4.1 mmol/L (ref 3.5–5.1)
SODIUM: 132 mmol/L — AB (ref 135–145)

## 2015-11-13 LAB — CBC
HEMATOCRIT: 32.1 % — AB (ref 35.0–47.0)
Hemoglobin: 10.7 g/dL — ABNORMAL LOW (ref 12.0–16.0)
MCH: 35.8 pg — ABNORMAL HIGH (ref 26.0–34.0)
MCHC: 33.4 g/dL (ref 32.0–36.0)
MCV: 107.1 fL — ABNORMAL HIGH (ref 80.0–100.0)
Platelets: 120 10*3/uL — ABNORMAL LOW (ref 150–440)
RBC: 3 MIL/uL — ABNORMAL LOW (ref 3.80–5.20)
RDW: 15.4 % — AB (ref 11.5–14.5)
WBC: 4.5 10*3/uL (ref 3.6–11.0)

## 2015-11-13 MED ORDER — APIXABAN 5 MG PO TABS: 5.0000 mg | ORAL_TABLET | Freq: Two times a day (BID) | ORAL | Status: DC

## 2015-11-13 MED ORDER — AMOXICILLIN-POT CLAVULANATE 875-125 MG PO TABS
1.0000 | ORAL_TABLET | Freq: Two times a day (BID) | ORAL | Status: DC
  Administered 2015-11-13: 1 via ORAL
  Filled 2015-11-13: qty 1

## 2015-11-13 MED ORDER — LEVOFLOXACIN 250 MG PO TABS: 250.0000 mg | ORAL_TABLET | Freq: Every day | ORAL | Status: DC

## 2015-11-13 MED ORDER — APIXABAN 5 MG PO TABS
10.0000 mg | ORAL_TABLET | Freq: Two times a day (BID) | ORAL | Status: DC
  Administered 2015-11-13: 10 mg via ORAL
  Filled 2015-11-13: qty 2

## 2015-11-13 MED ORDER — APIXABAN 5 MG PO TABS: 10.0000 mg | ORAL_TABLET | Freq: Two times a day (BID) | ORAL | Status: DC

## 2015-11-13 MED ORDER — AMOXICILLIN-POT CLAVULANATE 875-125 MG PO TABS: 1.0000 | ORAL_TABLET | Freq: Two times a day (BID) | ORAL | Status: DC

## 2015-11-13 NOTE — Progress Notes (Signed)
Pt discharged today, discharge instructions reviewed with patient and her husband. Prescriptions given to her husband. Pt was placed on her home O2, and rolled out in wheelchair by staff.

## 2015-11-13 NOTE — Evaluation (Signed)
Physical Therapy Evaluation Patient Details Name: Cheryl Cannon MRN: [id number] DOB: Dec 19, 1935 Today's Date: 11/13/2015   History of Present Illness  presented to ER secondary to SOB; admitted for R LL PNA (failed outpatient treatment) and acute PE (while on Pradaxa).  Now transitioned to eliquis.  Of note, per telephone clarification with Dr. Volanda Napoleon and discussion with primary RN, patient cleared and appropriate for participation with PT evaluation at this time despite admitting diagnosis.  Clinical Impression  Upon evaluation, patient alert and oriented; follows all commands and demonstrates fair insight/safety awareness.  Bilat UE/LE strength grossly symmetrical and WFL for basic transfers and mobility.  Currently able to complete bed mobility, sit/stand and basic transfers indep without assist device; gait (220') without assist device, cga/close sup.  Fair cadence and gait performance, but significant fatigue/SOB noted with exertion.  Patient rating BORG 8/10 with noted desat to 86% on 2L supplemental O2 (requiring 30-45 seconds seated rest and pursed lip breathing for recovery).  Anticipate need for continuous home O2 at this time. Would benefit from skilled PT to address above deficits and promote optimal return to PLOF; Recommend transition to [place] upon discharge from acute hospitalization.     Follow Up Recommendations Home health PT;Supervision/Assistance - 24 hour    Equipment Recommendations       Recommendations for Other Services       Precautions / Restrictions Precautions Precautions: Fall Restrictions Weight Bearing Restrictions: No      Mobility  Bed Mobility Overal bed mobility: Independent                Transfers Overall transfer level: Independent Equipment used: None                Ambulation/Gait Ambulation/Gait assistance: Supervision;Min guard Ambulation Distance (Feet): 220 Feet Assistive device: None   Gait velocity: 10' walk time, 6  seconds   General Gait Details: reciprocal stepping pattern with fair step height/length; able to complete head turns and changes of direction without LOB.  Mod fatigue (BORG 8/10), significant SOB with exertion--sats decrease 86% on 2L with exertion, requiring 30-45 seconds seated rest and pursed lip breathing.  Stairs            Wheelchair Mobility    Modified Rankin (Stroke Patients Only)       Balance Overall balance assessment: Needs assistance Sitting-balance support: No upper extremity supported;Feet supported Sitting balance-Leahy Scale: Good     Standing balance support: No upper extremity supported Standing balance-Leahy Scale: Good                               Pertinent Vitals/Pain Pain Assessment: No/denies pain    Home Living Family/patient expects to be discharged to:: Private residence Living Arrangements: Spouse/significant other Available Help at Discharge: Family Type of Home: House       Home Layout: One level        Prior Function Level of Independence: Independent         Comments: Indep with all household mobility and ADLs at [facility] facility. Has recently spent one week at skilled nursing portion of care facility due to SOB, but returned home approximately 1 day prior to presentation to hospital.     Hand Dominance        Extremity/Trunk Assessment   Upper Extremity Assessment: Overall WFL for tasks assessed           Lower Extremity Assessment: Overall WFL for tasks  assessed         Communication   Communication: No difficulties  Cognition Arousal/Alertness: Awake/alert Behavior During Therapy: WFL for tasks assessed/performed Overall Cognitive Status: Within Functional Limits for tasks assessed                      General Comments      Exercises Other Exercises Other Exercises: Educated on need for activity pacing, energy conservation with activities and use of home O2  (management of tubing, need for 24 hour supplemental O2)--patient/husband voiced understanding.      Assessment/Plan    PT Assessment Patient needs continued PT services  PT Diagnosis Difficulty walking;Generalized weakness   PT Problem List Decreased activity tolerance;Cardiopulmonary status limiting activity  PT Treatment Interventions Gait training;Functional mobility training;Therapeutic activities;Therapeutic exercise;Patient/family education   PT Goals (Current goals can be found in the Care Plan section) Acute Rehab PT Goals Patient Stated Goal: "to return home" PT Goal Formulation: With patient Time For Goal Achievement: 11/27/15 Potential to Achieve Goals: Good Additional Goals Additional Goal #1: Indep understanding and use of energy conservation and activity pacing techniques.    Frequency Min 2X/week   Barriers to discharge        Co-evaluation               End of Session Equipment Utilized During Treatment: Gait belt Activity Tolerance: Patient tolerated treatment well Patient left: in bed;with call bell/phone within reach;with bed alarm set Nurse Communication: Mobility status (O2 response to activity)    Functional Assessment Tool Used: clinical judgement, 10' walk time Functional Limitation: Mobility: Walking and moving around Mobility: Walking and Moving Around Current Status (U8828): At least 20 percent but less than 40 percent impaired, limited or restricted Mobility: Walking and Moving Around Goal Status [phone]): 0 percent impaired, limited or restricted    Time: 1791-5056 PT Time Calculation (min) (ACUTE ONLY): 14 min   Charges:   PT Evaluation $Initial PT Evaluation Tier I: 1 Procedure PT Treatments $Therapeutic Activity: 8-22 mins   PT G Codes:   PT G-Codes **NOT FOR INPATIENT CLASS** Functional Assessment Tool Used: clinical judgement, 10' walk time Functional Limitation: Mobility: Walking and moving around Mobility: Walking and  Moving Around Current Status (P7948): At least 20 percent but less than 40 percent impaired, limited or restricted Mobility: Walking and Moving Around Goal Status [phone]): 0 percent impaired, limited or restricted   Kenji Mapel H. Owens Shark, PT, DPT, NCS 11/13/2015, 2:12 PM [phone]

## 2015-11-13 NOTE — Care Management (Signed)
Physical therapy evaluation completed. Recommends home with home health and physical therapy. 24/7 supervision. Discussed agencies with Mr & Ms Frisinger at the bedside. [facility]. Will update Floydene Flock, [facility] representative.  Discharge to home today per Dr. Volanda Napoleon Husband will transport. Shelbie Ammons RN MSN CCM Care Management [phone]

## 2015-11-13 NOTE — Discharge Instructions (Signed)
Continue to use the incentive spirometer given during hospitalization. Continue to use supplemental oxygen at all times until seen by primary care   DIET:  Cardiac diet  DISCHARGE CONDITION:  Fair  ACTIVITY:  Activity as tolerated  OXYGEN:  Home Oxygen: Yes.     Oxygen Delivery: 2 liters/min via Patient connected to nasal cannula oxygen  DISCHARGE LOCATION:  home   If you experience worsening of your admission symptoms, develop shortness of breath, life threatening emergency, suicidal or homicidal thoughts you must seek medical attention immediately by calling 911 or calling your MD immediately  if symptoms less severe.  You Must read complete instructions/literature along with all the possible adverse reactions/side effects for all the Medicines you take and that have been prescribed to you. Take any new Medicines after you have completely understood and accpet all the possible adverse reactions/side effects.   Please note  You were cared for by a hospitalist during your hospital stay. If you have any questions about your discharge medications or the care you received while you were in the hospital after you are discharged, you can call the unit and asked to speak with the hospitalist on call if the hospitalist that took care of you is not available. Once you are discharged, your primary care physician will handle any further medical issues. Please note that NO REFILLS for any discharge medications will be authorized once you are discharged, as it is imperative that you return to your primary care physician (or establish a relationship with a primary care physician if you do not have one) for your aftercare needs so that they can reassess your need for medications and monitor your lab values.

## 2015-11-13 NOTE — Progress Notes (Addendum)
Date: 11/13/2015,   MRN# [id number] Cheryl Cannon Sep 28, 1936 Code Status:     Code Status Orders        Start     Ordered   11/12/15 0547  Full code   Continuous     11/12/15 0546    [facility] Directive Documentation        Most Recent Value   Type of Advance Directive  Healthcare Power of [place], Living will [Donald Loper (Husband)]   Pre-existing out of facility DNR order (yellow form or pink MOST form)     "MOST" Form in Place?        HPI: Breathing better, comfortable. No significant layering out of fluid on lateral decubitus. No chest pain. pulm embolism on praxada. On eliquis.   PMHX:   Past Medical History  Diagnosis Date  . Lung cancer ([place])   . A-fib ([place])   . COPD (chronic obstructive pulmonary disease) ([place])    Surgical Hx:  Past Surgical History  Procedure Laterality Date  . Lung cancer surgery    . Pacemaker placement     Family Hx:  Family History  Problem Relation Age of Onset  . Cancer Mother   . Cancer Sister    Social Hx:   Social History  Substance Use Topics  . Smoking status: Former Research scientist (life sciences)  . Smokeless tobacco: None  . Alcohol Use: Yes   Medication:    Home Medication:  Current Outpatient Rx  Name  Route  Sig  Dispense  Refill  . apixaban (ELIQUIS) 5 MG TABS tablet   Oral   Take 2 tablets (10 mg total) by mouth 2 (two) times daily.   10 tablet   0   . apixaban (ELIQUIS) 5 MG TABS tablet   Oral   Take 1 tablet (5 mg total) by mouth 2 (two) times daily.   60 tablet   0   . levofloxacin (LEVAQUIN) 250 MG tablet   Oral   Take 1 tablet (250 mg total) by mouth daily.   8 tablet   0     Current Medication: '@CURMEDTAB'$ @   Allergies:  Gluten meal and Parabens  Review of Systems: Gen:  Denies  fever, sweats, chills HEENT: Denies blurred vision, double vision, ear pain, eye pain, hearing loss, nose bleeds, sore throat Cvc:  No dizziness, chest pain or heaviness Resp:    Gi: Denies swallowing difficulty, stomach pain, nausea or  vomiting, diarrhea, constipation, bowel incontinence Gu:  Denies bladder incontinence, burning urine Ext:   No Joint pain, stiffness or swelling Skin: No skin rash, easy bruising or bleeding or hives Endoc:  No polyuria, polydipsia , polyphagia or weight change Psych: No depression, insomnia or hallucinations  Other:  All other systems negative  Physical Examination:   VS: BP 124/57 mmHg  Pulse 61  Temp(Src) 97.8 F (36.6 C) (Oral)  Resp 18  Ht '5\' 2"'$  (1.575 m)  Wt 129 lb 8 oz (58.741 kg)  BMI 23.68 kg/m2  SpO2 92%  General Appearance: No distress  Neuro: without focal findings, mental status, speech normal, alert and oriented, cranial nerves 2-12 intact, reflexes normal and symmetric, sensation grossly normal  HEENT: PERRLA, EOM intact, no ptosis, no other lesions noticed, Mallampati: Pulmonary:.No wheezing, No rales  Sputum Production:   Cardiovascular:  Normal S1,S2.  No m/r/g.  Abdominal aorta pulsation normal.    Abdomen:Benign, Soft, non-tender, No masses, hepatosplenomegaly, No lymphadenopathy Endoc: No evident thyromegaly, no signs of acromegaly or Cushing features Skin:   warm,  no rashes, no ecchymosis  Extremities: normal, no cyanosis, clubbing, no edema, warm with normal capillary refill. Other findings:   Labs results:   Recent Labs     11/11/15  2352  11/13/15  0622  HGB  12.1  10.7*  HCT  36.5  32.1*  MCV  107.5*  107.1*  WBC  7.1  4.5  BUN  12  10  CREATININE  0.86  1.01*  GLUCOSE  132*  92  CALCIUM  9.3  8.3*  ,      Rad results:   Dg Chest Right Decubitus  11/12/2015  CLINICAL DATA:  History of right middle/right lower lobe resection for squamous cell lung cancer. Pleural thickening and patchy consolidation at the right lung base on chest CT earlier today. Small left pleural effusion. EXAM: CHEST - RIGHT DECUBITUS COMPARISON:  Chest CT angiogram and chest radiograph from earlier today. FINDINGS: There is stable volume loss in the right hemithorax  status post right middle and right lower lobectomies. There is prominent pleural thickening at the right costophrenic angle, which is unchanged on this right lateral decubitus view in comparison to upright view from earlier today, in keeping with right pleural thickening and not a layering right pleural effusion. The small left pleural effusion seen on the chest CT angiogram from earlier today appears to be layering as evidenced by increased depth of the left costophrenic angle on this right lateral decubitus view compared to the upright chest radiograph from earlier today. Stable patchy consolidation at the right lung base. Stable cardiomediastinal silhouette with mild cardiomegaly. Stable configuration of 2 lead left subclavian pacemaker. No pneumothorax. No overt pulmonary edema. IMPRESSION: 1. No evidence of a layering right pleural effusion. Persistent pleural thickening at the right costophrenic angle, which does not change in configuration on this decubitus view compared to the upright view from earlier today. 2. Small layering left pleural effusion (as better seen on the chest CT angiogram from earlier today). 3. Stable patchy consolidation at the right lung base, nonspecific, likely a pneumonia, cannot exclude lymphangitic tumor. Recommend follow-up PA and lateral post treatment chest radiographs or chest CT in 6-8 weeks. Electronically Signed   By: Ilona Sorrel M.D.   On: 11/12/2015 15:44      Assessment and Plan: Here with bilateral pleural effusion, no significant layering out - diagnostic thoracentesis not recommended  Single subsegmental pulmonary embolism to RUL -eliquis, will be on it for life as part of treatment for afib  Airspace disease noted on ct ? Pneumonia, ??? Lymphogenic spread -treat for cap  -repeat chest ct in 2-3 months   Emphysema -spiriva and albuterol -out patient pfts in 3 weeks     I have personally obtained a history, examined the patient, evaluated  laboratory and imaging results, formulated the assessment and plan and placed orders.  The Patient requires high complexity decision making for assessment and support, frequent evaluation and titration of therapies, application of advanced monitoring technologies and extensive interpretation of multiple databases.   Karan Ramnauth,M.D. Pulmonary & Critical care Medicine [facility]

## 2015-11-13 NOTE — Discharge Summary (Signed)
[facility] NAME: Cheryl Cannon    MR#:  [id number]  DATE OF BIRTH:  [birth year]  DATE OF ADMISSION:  11/11/2015 ADMITTING PHYSICIAN: Harrie Foreman, MD  DATE OF DISCHARGE: 11/13/2015  PRIMARY CARE PHYSICIAN: Singh,Jasmine, MD    ADMISSION DIAGNOSIS:  Pleural effusion [J90] Community acquired pneumonia [J18.9] Other acute pulmonary embolism without acute cor pulmonale (HCC) [I26.99]  DISCHARGE DIAGNOSIS:  Active Problems:   CAP (community acquired pneumonia)   SECONDARY DIAGNOSIS:   Past Medical History  Diagnosis Date  . Lung cancer ([place])   . A-fib ([place])   . COPD (chronic obstructive pulmonary disease) ([place])     HOSPITAL COURSE:   1. Pneumonia: Blood and sputum cultures negative to date. Treated with Levaquin and vancomycin initially. Will be discharged on Augmentin. Currently requiring supplemental oxygen at 2-3 L to maintain sats in the 90s and will be discharged on this at home. Will need to use incentive spirometer. No respiratory distress, fever. No significant leukocytosis. She does have very small bilateral effusions due to the pneumonia. No thoracentesis indicated.  2. Pulmonary embolus: Very small right subsegmental embolus occurred while on Pradaxa. Was treated initially with therapeutic Lovenox and then transitioned to [facility]. Lower extremity Dopplers negative.  3. History of lung cancer Will need repeat chest CT in 2-3 months, will follow with her pulmonologist Dr. Raul Del  4. Emphysema: Continue Spiriva and DuoNeb  5. Atrial fibrillation: Rate controlled continue flecainide, metoprolol, ELIQUIS.  6. Dementia - Continue Aricept  7. Vitamin D deficiency: Continue supplementation  8. GERD: Continue PPI  9. Acute respiratory failure: Due to pneumonia in the setting of COPD. Discharged on supplemental oxygen. We'll follow up with primary care to reassess for need.  10.  Deconditioning: Physical therapy recommended home health PT and 24 hour assistance.  DISCHARGE CONDITIONS:   Fair  CONSULTS OBTAINED:  Treatment Team:  Isaias Cowman, MD Erby Pian, MD  DRUG ALLERGIES:   Allergies  Allergen Reactions  . Gluten Meal Nausea And Vomiting  . Parabens Rash    DISCHARGE MEDICATIONS:   Current Discharge Medication List    START taking these medications   Details  amoxicillin-clavulanate (AUGMENTIN) 875-125 MG tablet Take 1 tablet by mouth every 12 (twelve) hours. Qty: 14 tablet, Refills: 0    !! apixaban (ELIQUIS) 5 MG TABS tablet Take 2 tablets (10 mg total) by mouth 2 (two) times daily. Qty: 10 tablet, Refills: 0    !! apixaban (ELIQUIS) 5 MG TABS tablet Take 1 tablet (5 mg total) by mouth 2 (two) times daily. Qty: 60 tablet, Refills: 0     !! - Potential duplicate medications found. Please discuss with provider.    CONTINUE these medications which have NOT CHANGED   Details  ALPRAZolam (XANAX) 0.25 MG tablet Take 0.25 mg by mouth daily as needed for anxiety.     cholecalciferol (VITAMIN D) 1000 UNITS tablet Take 2,000 Units by mouth daily.     clobetasol ointment (TEMOVATE) 2.95 % Apply 1 application topically 3 (three) times a week. Uses Mon, Wed, Fri    cyanocobalamin (,VITAMIN B-12,) 1000 MCG/ML injection Inject 1,000 mcg into the muscle every 30 (thirty) days. Injection once a month    donepezil (ARICEPT) 10 MG tablet Take 1 tablet (10 mg total) by mouth at bedtime. Qty: 90 tablet, Refills: 3   Associated Diagnoses: Dementia, without behavioral disturbance    flecainide (TAMBOCOR) 50 MG tablet Take 75  mg by mouth 2 (two) times daily.    furosemide (LASIX) 20 MG tablet Take 20 mg by mouth daily as needed for fluid or edema.     gabapentin (NEURONTIN) 300 MG capsule Take 4 capsules in the morning and 4 capsules in the evening Qty: 720 capsule, Refills: 2    lactulose (CHRONULAC) 10 GM/15ML solution Take 10 g by mouth  daily as needed for mild constipation.    levalbuterol (XOPENEX HFA) 45 MCG/ACT inhaler Inhale 2 puffs into the lungs every 4 (four) hours as needed for wheezing.    metoprolol tartrate (LOPRESSOR) 25 MG tablet Take 12.5 mg by mouth daily.     nortriptyline (PAMELOR) 10 MG capsule Take 1 capsule at bedtime for 1 week, then increase to 2 capsules at night Qty: 180 capsule, Refills: 3   Associated Diagnoses: Hereditary and idiopathic peripheral neuropathy    pantoprazole (PROTONIX) 40 MG tablet Take 40 mg by mouth daily.     tiotropium (SPIRIVA) 18 MCG inhalation capsule Place 18 mcg into inhaler and inhale daily.       STOP taking these medications     dabigatran (PRADAXA) 150 MG CAPS capsule          DISCHARGE INSTRUCTIONS:    Fair condition. Cardiac diet. Home health physical therapy and nursing as well as oxygen.  If you experience worsening of your admission symptoms, develop shortness of breath, life threatening emergency, suicidal or homicidal thoughts you must seek medical attention immediately by calling 911 or calling your MD immediately  if symptoms less severe.  You Must read complete instructions/literature along with all the possible adverse reactions/side effects for all the Medicines you take and that have been prescribed to you. Take any new Medicines after you have completely understood and accept all the possible adverse reactions/side effects.   Please note  You were cared for by a hospitalist during your hospital stay. If you have any questions about your discharge medications or the care you received while you were in the hospital after you are discharged, you can call the unit and asked to speak with the hospitalist on call if the hospitalist that took care of you is not available. Once you are discharged, your primary care physician will handle any further medical issues. Please note that NO REFILLS for any discharge medications will be authorized once you are  discharged, as it is imperative that you return to your primary care physician (or establish a relationship with a primary care physician if you do not have one) for your aftercare needs so that they can reassess your need for medications and monitor your lab values.    Today   CHIEF COMPLAINT:   Chief Complaint  Patient presents with  . Shortness of Breath    HISTORY OF PRESENT ILLNESS:   The patient presents emergency department from the independent section of the [facility] complaining of shortness of breath. She had been receiving amoxicillin for the last week due to an upper respiratory infection. The patient had also been on supplemental oxygen as needed while in the skilled nursing portion of the [employer] facility. Last night she states that the oxygen per nasal cannula was not helping. In the emergency department chest x-ray showed right lower lobe pneumonia as well as effusion. CT angiogram of the chest also showed a pulmonary embolus that is new from prior study. Due to her increasing oxygen requirement and failed outpatient treatment of pneumonia the emergency department called the  hospitalist service for admission.  VITAL SIGNS:  Blood pressure 106/56, pulse 66, temperature 98 F (36.7 C), temperature source Oral, resp. rate 18, height '5\' 2"'$  (1.575 m), weight 58.741 kg (129 lb 8 oz), SpO2 86 %.  I/O:   Intake/Output Summary (Last 24 hours) at 11/13/15 1513 Last data filed at 11/13/15 1300  Gross per 24 hour  Intake    600 ml  Output      0 ml  Net    600 ml    PHYSICAL EXAMINATION:  GENERAL: 79 y.o.-year-old patient lying in the bed with no acute distress. Thin LUNGS: Good air movement, fine crackles at bases, no distress  CARDIOVASCULAR: Distant, S1, S2 normal. No murmurs, rubs, or gallops.  ABDOMEN: Soft, nontender, nondistended. Bowel sounds present. No organomegaly or mass.  EXTREMITIES: No pedal edema, cyanosis, or clubbing.   NEUROLOGIC: Cranial nerves II through XII are intact. Muscle strength 5/5 in all extremities. Sensation intact. Gait not checked.  PSYCHIATRIC: The patient is alert and oriented x 3.  SKIN: No obvious rash, lesion, or ulcer.   DATA REVIEW:   CBC  Recent Labs Lab 11/13/15 0622  WBC 4.5  HGB 10.7*  HCT 32.1*  PLT 120*    Chemistries   Recent Labs Lab 11/10/15 0530 11/11/15 2352 11/13/15 0622  NA 138 136 132*  K 3.7 4.4 4.1  CL 105 103 102  CO2 '28 27 26  '$ GLUCOSE 86 132* 92  BUN '7 12 10  '$ CREATININE 0.85 0.86 1.01*  CALCIUM 8.5* 9.3 8.3*  MG 2.0  --   --   AST 27 45*  --   ALT 19 26  --   ALKPHOS 73 106  --   BILITOT 1.0 1.4*  --     Cardiac Enzymes No results for input(s): TROPONINI in the last 168 hours.  Microbiology Results  Results for orders placed or performed during the hospital encounter of 11/11/15  MRSA PCR Screening     Status: None   Collection Time: 11/12/15  6:44 PM  Result Value Ref Range Status   MRSA by PCR NEGATIVE NEGATIVE Final    Comment:        The GeneXpert MRSA Assay (FDA approved for NASAL specimens only), is one component of a comprehensive MRSA colonization surveillance program. It is not intended to diagnose MRSA infection nor to guide or monitor treatment for MRSA infections.     RADIOLOGY:  Dg Chest 2 View  11/12/2015  CLINICAL DATA:  Shortness of breath for 1 day. History of lung cancer, pneumonia, COPD. EXAM: CHEST  2 VIEW COMPARISON:  None available for comparison at time of study interpretation. FINDINGS: Moderate RIGHT pleural effusion with underlying consolidation and volume loss/atelectasis. Cardiomediastinal silhouette is slightly shifted to the RIGHT, overall normal in size. Mildly calcified aortic knob. Dual lead LEFT cardiac pacemaker with lead projecting RIGHT atrium and RIGHT ventricle. RIGHT apical pleural thickening. No pneumothorax. Crescentic calcification inferior to the LEFT glenohumeral joint could  represent loose body or degenerative change. Upper lumbar levoscoliosis. IMPRESSION: Moderate RIGHT pleural effusion with underlying consolidation. LEFT lung base atelectasis/ scarring. Recommend follow-up chest radiograph after treatment to exclude underlying mass. Electronically Signed   By: Elon Alas M.D.   On: 11/12/2015 00:26   Ct Angio Chest Pe W/cm &/or Wo Cm  11/12/2015  CLINICAL DATA:  Shortness of breath. History of lung cancer. Recent lung infection. EXAM: CT ANGIOGRAPHY CHEST WITH CONTRAST TECHNIQUE: Multidetector CT imaging of the chest was performed using  the standard protocol during bolus administration of intravenous contrast. Multiplanar CT image reconstructions and MIPs were obtained to evaluate the vascular anatomy. CONTRAST:  66m OMNIPAQUE IOHEXOL 350 MG/ML SOLN COMPARISON:  None. FINDINGS: THORACIC INLET/BODY WALL: Dual-chamber pacer from the left with leads in unremarkable position. Dermal inclusion cyst in the right axilla. Mild anasarca. MEDIASTINUM: Borderline cardiomegaly. No significant pericardial effusion. Intrahepatic reflux of right heart contrast suggesting elevated pressures. Essentially non-opacified aorta due to contrast timing. No evidence of acute aortic syndrome. Nonocclusive filling defect within subsegmental right upper lobe branch on series 9, image 93. When allowing for motion at the level of the anterior basilar segment vessels on the left, there is no convincing other pulmonary embolism. Given solitary small embolus, acute right heart strain is not considered. LUNG WINDOWS: There is nodular interstitial thickening and airspace opacity in the right basilar lung neighboring postoperative changes of lower lobectomy. It is unclear if this is pneumonia, radiation changes, or lymphangitic tumor. There is associated pleural thickening which could be treatment related scarring. Small layering left pleural effusion. Suspect early interstitial edema with septal  thickening at the bases. Emphysema, panlobular at the apices. There are patchy ground-glass opacities in the left upper lobe measuring 2 cm or less, chronicity indeterminate without comparison. Presumed surveillance in this patient with history of lung cancer. UPPER ABDOMEN: No acute finding.  Negative adrenals. OSSEOUS: No acute fracture.  No suspicious lytic or blastic lesions. These results were called by telephone at the time of interpretation on 11/12/2015 at 2:49 am to Dr. RMarjean Donna, who verbally acknowledged these results. Review of the MIP images confirms the above findings. IMPRESSION: 1. Single subsegmental pulmonary embolism to the right upper lobe, recent appearing. 2. Airspace disease and pleural thickening at the right base neighboring lower lobectomy changes. Without CT comparison, pneumonia, radiation change, or lymphangitic tumor cannot be distinguished - but appearance on esophagram July 2016 favors pneumonia. Recommend follow-up with CT surgeon for radiographic correlation. 3. Small left pleural effusion and probable developing pulmonary edema. 4. Emphysema. Electronically Signed   By: JMonte FantasiaM.D.   On: 11/12/2015 02:54   Dg Chest Right Decubitus  11/12/2015  CLINICAL DATA:  History of right middle/right lower lobe resection for squamous cell lung cancer. Pleural thickening and patchy consolidation at the right lung base on chest CT earlier today. Small left pleural effusion. EXAM: CHEST - RIGHT DECUBITUS COMPARISON:  Chest CT angiogram and chest radiograph from earlier today. FINDINGS: There is stable volume loss in the right hemithorax status post right middle and right lower lobectomies. There is prominent pleural thickening at the right costophrenic angle, which is unchanged on this right lateral decubitus view in comparison to upright view from earlier today, in keeping with right pleural thickening and not a layering right pleural effusion. The small left pleural effusion  seen on the chest CT angiogram from earlier today appears to be layering as evidenced by increased depth of the left costophrenic angle on this right lateral decubitus view compared to the upright chest radiograph from earlier today. Stable patchy consolidation at the right lung base. Stable cardiomediastinal silhouette with mild cardiomegaly. Stable configuration of 2 lead left subclavian pacemaker. No pneumothorax. No overt pulmonary edema. IMPRESSION: 1. No evidence of a layering right pleural effusion. Persistent pleural thickening at the right costophrenic angle, which does not change in configuration on this decubitus view compared to the upright view from earlier today. 2. Small layering left pleural effusion (as better seen on the chest  CT angiogram from earlier today). 3. Stable patchy consolidation at the right lung base, nonspecific, likely a pneumonia, cannot exclude lymphangitic tumor. Recommend follow-up PA and lateral post treatment chest radiographs or chest CT in 6-8 weeks. Electronically Signed   By: Ilona Sorrel M.D.   On: 11/12/2015 15:44    EKG:   Orders placed or performed during the hospital encounter of 11/11/15  . EKG 12-Lead  . EKG 12-Lead      Management plans discussed with the patient, family and they are in agreement.  CODE STATUS:     Code Status Orders        Start     Ordered   11/12/15 0547  Full code   Continuous     11/12/15 0546    Advance Directive Documentation        Most Recent Value   Type of Advance Directive  Healthcare Power of [place], Living will [Donald Revard (Husband)]   Pre-existing out of facility DNR order (yellow form or pink MOST form)     "MOST" Form in Place?        TOTAL TIME TAKING CARE OF THIS PATIENT: 35 minutes.  Greater than 50% of time spent in care coordination and counseling.  Myrtis Ser M.D on 11/13/2015 at 3:13 PM  Between 7am to 6pm - Pager - [phone]  After 6pm go to [email or web address] - password [email or web address]  [facility]  Office  [phone]  CC: Primary care physician; Glendon Axe, MD

## 2015-11-13 NOTE — Plan of Care (Signed)
Problem: Respiratory: Goal: Ability to maintain a clear airway will improve Outcome: Progressing Pt alert and oriented. No c/o pain nor respiratory distress noted. Pacing on monitor. Continue to monitor.

## 2015-11-13 NOTE — Progress Notes (Signed)
ANTICOAGULATION CONSULT NOTE - Initial Consult  Pharmacy Consult for apixaban Indication: pulmonary embolus  Allergies  Allergen Reactions  . Gluten Meal Nausea And Vomiting  . Parabens Rash    Patient Measurements: Height: '5\' 2"'$  (157.5 cm) Weight: 129 lb 8 oz (58.741 kg) IBW/kg (Calculated) : 50.1 Heparin Dosing Weight:   Vital Signs: Temp: 97.6 F (36.4 C) (12/22 0452) Temp Source: Oral (12/22 0452) BP: 122/51 mmHg (12/22 0452) Pulse Rate: 61 (12/22 0452)  Labs:  Recent Labs  11/10/15 1100 11/11/15 2352 11/13/15 0622  HGB 10.9* 12.1 10.7*  HCT 33.0* 36.5 32.1*  PLT 128* 157 120*  CREATININE  --  0.86 1.01*    Estimated Creatinine Clearance: 36.9 mL/min (by C-G formula based on Cr of 1.01).   Medical History: Past Medical History  Diagnosis Date  . Lung cancer ([place])   . A-fib ([place])   . COPD (chronic obstructive pulmonary disease) ([facility])     Medications:  Infusions:    Assessment: 79 yof cc SOB/CXR showing PNA and right sided segmental embolus. Was on Pradaxa outpatient for AF/stroke prevention. Has been on therapeutic dose LMWH here, pharmacy consulted to transition to apixaban. Goal of Therapy:   Monitor platelets by anticoagulation protocol: Yes   Plan:  Apixaban 10 mg PO BID x 7 days followed by 5 mg PO BID. Pharmacy will continue to follow.  Laural Benes, Pharm.D., BCPS Clinical Pharmacist 11/13/2015,6:58 AM

## 2015-11-15 ENCOUNTER — Emergency Department: Payer: Medicare Other

## 2015-11-15 ENCOUNTER — Encounter: Payer: Self-pay | Admitting: Emergency Medicine

## 2015-11-15 ENCOUNTER — Inpatient Hospital Stay
Admission: EM | Admit: 2015-11-15 | Discharge: 2015-11-17 | DRG: 190 | Disposition: A | Discharge status: Home / Self Care | Payer: Medicare Other | Attending: Internal Medicine | Admitting: Internal Medicine

## 2015-11-15 DIAGNOSIS — J440 Chronic obstructive pulmonary disease with acute lower respiratory infection: Principal | ICD-10-CM | POA: Diagnosis present

## 2015-11-15 DIAGNOSIS — I482 Chronic atrial fibrillation: Secondary | ICD-10-CM | POA: Diagnosis present

## 2015-11-15 DIAGNOSIS — R0602 Shortness of breath: Secondary | ICD-10-CM | POA: Diagnosis present

## 2015-11-15 DIAGNOSIS — K219 Gastro-esophageal reflux disease without esophagitis: Secondary | ICD-10-CM | POA: Diagnosis present

## 2015-11-15 DIAGNOSIS — I272 Other secondary pulmonary hypertension: Secondary | ICD-10-CM | POA: Diagnosis present

## 2015-11-15 DIAGNOSIS — Z7901 Long term (current) use of anticoagulants: Secondary | ICD-10-CM | POA: Diagnosis not present

## 2015-11-15 DIAGNOSIS — Z91018 Allergy to other foods: Secondary | ICD-10-CM

## 2015-11-15 DIAGNOSIS — Z950 Presence of cardiac pacemaker: Secondary | ICD-10-CM | POA: Diagnosis not present

## 2015-11-15 DIAGNOSIS — K900 Celiac disease: Secondary | ICD-10-CM | POA: Diagnosis present

## 2015-11-15 DIAGNOSIS — J189 Pneumonia, unspecified organism: Secondary | ICD-10-CM | POA: Diagnosis present

## 2015-11-15 DIAGNOSIS — Z79899 Other long term (current) drug therapy: Secondary | ICD-10-CM | POA: Diagnosis not present

## 2015-11-15 DIAGNOSIS — J9601 Acute respiratory failure with hypoxia: Secondary | ICD-10-CM | POA: Diagnosis present

## 2015-11-15 DIAGNOSIS — Z87891 Personal history of nicotine dependence: Secondary | ICD-10-CM | POA: Diagnosis not present

## 2015-11-15 DIAGNOSIS — J811 Chronic pulmonary edema: Secondary | ICD-10-CM | POA: Diagnosis present

## 2015-11-15 HISTORY — DX: Celiac disease: K90.0

## 2015-11-15 HISTORY — DX: Malignant (primary) neoplasm, unspecified: C80.1

## 2015-11-15 HISTORY — DX: Other amnesia: R41.3

## 2015-11-15 LAB — CBC WITH DIFFERENTIAL/PLATELET
BASOS ABS: 0 10*3/uL (ref 0–0.1)
BASOS PCT: 0 %
EOS PCT: 1 %
Eosinophils Absolute: 0.1 10*3/uL (ref 0–0.7)
HCT: 36.5 % (ref 35.0–47.0)
Hemoglobin: 12.3 g/dL (ref 12.0–16.0)
Lymphocytes Relative: 5 %
Lymphs Abs: 0.4 10*3/uL — ABNORMAL LOW (ref 1.0–3.6)
MCH: 36 pg — ABNORMAL HIGH (ref 26.0–34.0)
MCHC: 33.8 g/dL (ref 32.0–36.0)
MCV: 106.3 fL — AB (ref 80.0–100.0)
MONO ABS: 0.5 10*3/uL (ref 0.2–0.9)
MONOS PCT: 6 %
Neutro Abs: 8.1 10*3/uL — ABNORMAL HIGH (ref 1.4–6.5)
Neutrophils Relative %: 88 %
PLATELETS: 149 10*3/uL — AB (ref 150–440)
RBC: 3.43 MIL/uL — ABNORMAL LOW (ref 3.80–5.20)
RDW: 15.3 % — AB (ref 11.5–14.5)
WBC: 9.1 10*3/uL (ref 3.6–11.0)

## 2015-11-15 LAB — BASIC METABOLIC PANEL
ANION GAP: 9 (ref 5–15)
BUN: 12 mg/dL (ref 6–20)
CALCIUM: 9.1 mg/dL (ref 8.9–10.3)
CO2: 26 mmol/L (ref 22–32)
CREATININE: 0.77 mg/dL (ref 0.44–1.00)
Chloride: 101 mmol/L (ref 101–111)
GLUCOSE: 117 mg/dL — AB (ref 65–99)
Potassium: 4.2 mmol/L (ref 3.5–5.1)
Sodium: 136 mmol/L (ref 135–145)

## 2015-11-15 LAB — TROPONIN I: Troponin I: <0.03 ng/mL (ref <0.031)

## 2015-11-15 MED ORDER — DOCUSATE SODIUM 100 MG PO CAPS
100.0000 mg | ORAL_CAPSULE | Freq: Two times a day (BID) | ORAL | Status: DC
  Administered 2015-11-15 – 2015-11-17 (×5): 100 mg via ORAL
  Filled 2015-11-15 (×5): qty 1

## 2015-11-15 MED ORDER — SODIUM CHLORIDE 0.9 % IJ SOLN: 3.0000 mL | INTRAMUSCULAR | Status: DC | PRN

## 2015-11-15 MED ORDER — POLYMYXIN B-TRIMETHOPRIM 10000-0.1 UNIT/ML-% OP SOLN
2.0000 [drp] | OPHTHALMIC | Status: DC
  Filled 2015-11-15: qty 10

## 2015-11-15 MED ORDER — SODIUM CHLORIDE 0.9 % IJ SOLN
3.0000 mL | Freq: Two times a day (BID) | INTRAMUSCULAR | Status: DC
  Administered 2015-11-16 – 2015-11-17 (×2): 3 mL via INTRAVENOUS

## 2015-11-15 MED ORDER — VANCOMYCIN HCL IN DEXTROSE 750-5 MG/150ML-% IV SOLN
750.0000 mg | INTRAVENOUS | Status: DC
  Administered 2015-11-15: 750 mg via INTRAVENOUS
  Filled 2015-11-15 (×2): qty 150

## 2015-11-15 MED ORDER — FLECAINIDE ACETATE 50 MG PO TABS
75.0000 mg | ORAL_TABLET | Freq: Two times a day (BID) | ORAL | Status: DC
  Administered 2015-11-15 – 2015-11-17 (×5): 75 mg via ORAL
  Filled 2015-11-15 (×8): qty 2

## 2015-11-15 MED ORDER — SODIUM CHLORIDE 0.9 % IV SOLN
INTRAVENOUS | Status: AC
  Administered 2015-11-15 – 2015-11-16 (×2): via INTRAVENOUS

## 2015-11-15 MED ORDER — DONEPEZIL HCL 5 MG PO TABS
10.0000 mg | ORAL_TABLET | Freq: Every day | ORAL | Status: DC
  Administered 2015-11-15 – 2015-11-16 (×2): 10 mg via ORAL
  Filled 2015-11-15 (×2): qty 2

## 2015-11-15 MED ORDER — METOPROLOL SUCCINATE ER 25 MG PO TB24
12.5000 mg | ORAL_TABLET | Freq: Every day | ORAL | Status: DC
  Administered 2015-11-15 – 2015-11-17 (×3): 12.5 mg via ORAL
  Filled 2015-11-15 (×3): qty 1

## 2015-11-15 MED ORDER — SODIUM CHLORIDE 0.9 % IV SOLN: 250.0000 mL | INTRAVENOUS | Status: DC | PRN

## 2015-11-15 MED ORDER — DEXTROSE 5 % IV SOLN
1.0000 g | Freq: Two times a day (BID) | INTRAVENOUS | Status: DC
  Administered 2015-11-16 (×2): 1 g via INTRAVENOUS
  Filled 2015-11-15 (×3): qty 1

## 2015-11-15 MED ORDER — FUROSEMIDE 10 MG/ML IJ SOLN
40.0000 mg | Freq: Once | INTRAMUSCULAR | Status: AC
  Administered 2015-11-15: 40 mg via INTRAVENOUS
  Filled 2015-11-15: qty 4

## 2015-11-15 MED ORDER — ALBUTEROL SULFATE (2.5 MG/3ML) 0.083% IN NEBU
2.5000 mg | INHALATION_SOLUTION | RESPIRATORY_TRACT | Status: DC | PRN
  Administered 2015-11-16: 2.5 mg via RESPIRATORY_TRACT
  Filled 2015-11-15: qty 3

## 2015-11-15 MED ORDER — AMIODARONE IV BOLUS ONLY 150 MG/100ML
150.0000 mg | Freq: Once | INTRAVENOUS | Status: AC
  Administered 2015-11-15: 150 mg via INTRAVENOUS
  Filled 2015-11-15: qty 100

## 2015-11-15 MED ORDER — VANCOMYCIN HCL IN DEXTROSE 750-5 MG/150ML-% IV SOLN
750.0000 mg | INTRAVENOUS | Status: AC
  Administered 2015-11-15: 750 mg via INTRAVENOUS
  Filled 2015-11-15: qty 150

## 2015-11-15 MED ORDER — LEVOFLOXACIN IN D5W 750 MG/150ML IV SOLN
750.0000 mg | Freq: Once | INTRAVENOUS | Status: AC
  Administered 2015-11-15: 750 mg via INTRAVENOUS
  Filled 2015-11-15: qty 150

## 2015-11-15 MED ORDER — GABAPENTIN 400 MG PO CAPS
1200.0000 mg | ORAL_CAPSULE | Freq: Two times a day (BID) | ORAL | Status: DC
  Administered 2015-11-15 – 2015-11-17 (×5): 1200 mg via ORAL
  Filled 2015-11-15 (×5): qty 3

## 2015-11-15 MED ORDER — CEFEPIME HCL 1 G IJ SOLR
1.0000 g | Freq: Three times a day (TID) | INTRAMUSCULAR | Status: DC
  Administered 2015-11-15: 1 g via INTRAVENOUS
  Filled 2015-11-15 (×3): qty 1

## 2015-11-15 MED ORDER — PANTOPRAZOLE SODIUM 40 MG PO TBEC
40.0000 mg | DELAYED_RELEASE_TABLET | Freq: Every day | ORAL | Status: DC
  Administered 2015-11-15 – 2015-11-17 (×3): 40 mg via ORAL
  Filled 2015-11-15 (×3): qty 1

## 2015-11-15 MED ORDER — IPRATROPIUM-ALBUTEROL 0.5-2.5 (3) MG/3ML IN SOLN
3.0000 mL | Freq: Four times a day (QID) | RESPIRATORY_TRACT | Status: DC
  Administered 2015-11-15 – 2015-11-17 (×8): 3 mL via RESPIRATORY_TRACT
  Filled 2015-11-15 (×9): qty 3

## 2015-11-15 MED ORDER — APIXABAN 5 MG PO TABS
5.0000 mg | ORAL_TABLET | Freq: Two times a day (BID) | ORAL | Status: DC
Start: 2015-11-15 — End: 2015-11-17
  Administered 2015-11-15 – 2015-11-17 (×5): 5 mg via ORAL
  Filled 2015-11-15 (×5): qty 1

## 2015-11-15 NOTE — Progress Notes (Signed)
I am familiar with this patient, just discharged her yesterday. Seems that she had an episode of shortness of breath yesterday. Had home 02 on. Checked her pulse ox and was not hypoxic. Felt that she could not get a deep breath so came to ED. Still on 2L and with sats in 90%'s. No distress this morning. CXR unchanged. No fever, leucocytosis.  She also had a very small PE on CTA at last admission, this would not likely cause symptoms. LE dopplers negative.  Will observe today. Continue antibiotics, supplemental 02 and incentive spirometer.

## 2015-11-15 NOTE — Progress Notes (Addendum)
ANTIBIOTIC CONSULT NOTE - INITIAL  Pharmacy Consult for Vancomycin/Cefepime  Indication: pneumonia  Allergies  Allergen Reactions  . Gluten Meal Other (See Comments)    GI UPSET    Patient Measurements: Height: '5\' 2"'$  (157.5 cm) Weight: 115 lb (52.164 kg) IBW/kg (Calculated) : 50.1 Adjusted Body Weight: 50.9 kg   Vital Signs: Temp: 97.3 F (36.3 C) (12/24 1112) Temp Source: Oral (12/24 1112) BP: 96/69 mmHg (12/24 1112) Pulse Rate: 125 (12/24 1112) Intake/Output from previous day:   Intake/Output from this shift: Total I/O In: 290 [P.O.:240; IV Piggyback:50] Out: 400 [Urine:400]  Labs:  Recent Labs  11/15/15 0336  WBC 9.1  HGB 12.3  PLT 149*  CREATININE 0.77   Estimated Creatinine Clearance: 46.6 mL/min (by C-G formula based on Cr of 0.77). No results for input(s): VANCOTROUGH, VANCOPEAK, VANCORANDOM, GENTTROUGH, GENTPEAK, GENTRANDOM, TOBRATROUGH, TOBRAPEAK, TOBRARND, AMIKACINPEAK, AMIKACINTROU, AMIKACIN in the last 72 hours.   Microbiology: No results found for this or any previous visit (from the past 720 hour(s)).  Medical History: Past Medical History  Diagnosis Date  . COPD (chronic obstructive pulmonary disease) ([place])   . Cancer ([place])   . Memory deficit   . Celiac disease   . A-fib ([place])     Medications:  Prescriptions prior to admission  Medication Sig Dispense Refill Last Dose  . amoxicillin (AMOXIL) 500 MG capsule Take 1 capsule by mouth 2 (two) times daily. For 7 days  0 Past Month at Unknown time  . amoxicillin-clavulanate (AUGMENTIN) 875-125 MG tablet Take 1 tablet by mouth 2 (two) times daily. For 7 days   11/14/2015 at 2100  . apixaban (ELIQUIS) 5 MG TABS tablet Take 5 mg by mouth 2 (two) times daily.   11/14/2015 at 2100  . docusate sodium (COLACE) 100 MG capsule Take 100 mg by mouth 2 (two) times daily.   11/14/2015 at 2100  . donepezil (ARICEPT) 10 MG tablet Take 10 mg by mouth at bedtime.   11/14/2015 at 2100  . flecainide (TAMBOCOR) 50  MG tablet Take 1.5 mg by mouth 2 (two) times daily.   11/14/2015 at 2100  . gabapentin (NEURONTIN) 300 MG capsule Take 1,200 mg by mouth 2 (two) times daily.   11/14/2015 at 2100  . metoprolol succinate (TOPROL-XL) 25 MG 24 hr tablet Take 12.5 mg by mouth daily.   11/14/2015 at 2100  . pantoprazole (PROTONIX) 40 MG tablet Take 40 mg by mouth daily.   11/14/2015 at Unknown time   Assessment: CrCl = 46.6 ml/min Ke = 0.04 hr-1 T1/2 = 17.3 hrs Vd = 36.5 L    Goal of Therapy:  Vancomycin trough level 15-20 mcg/ml  Plan:  Expected duration 7 days with resolution of temperature and/or normalization of WBC   Cefepime 1 gm IV Q8H originally ordered, first dose given on 12/24 @ 11:00. Will adjust dose to Cefepime 1 gm IV Q12H to start 12/24 @ 23:00.  Vancomycin 750 mg IV X 1 given on 12/24 @ 13:00. Vancomycin 750 mg IV Q24H ordered to start on 12/24 @ 22:00 ,  9 hrs after 1st dose (stacked dosing). This pt will reach Css by 12/28 @ 1:00. Will draw 1st trough on 12/27 @ 21:30, which will be very close to Css.   Anais Denslow D 11/15/2015,11:39 AM

## 2015-11-15 NOTE — ED Notes (Signed)
Repeat EKG completed at this time after pacemaker setting changed to YES.

## 2015-11-15 NOTE — ED Notes (Addendum)
Pt exhibiting sustained ventricular tachycardia.  Pt reports mild central chest discomfort described as tightness/burning and rated as a 3. Pt reports the area is also tender to palpation. Pt denies lightheadedness/dizziness/weakness/nausea.   PT alert and oriented X4. Pt was given a bolus of a bolus of amiodarone, with no effect on rhythm or pt's chest discomfort.

## 2015-11-15 NOTE — ED Provider Notes (Signed)
[facility] Emergency Department Provider Note    ____________________________________________  Time seen: 0400  I have reviewed the triage vital signs and the nursing notes.   HISTORY  Chief Complaint Shortness of Breath   History limited by: Not Limited   HPI Cheryl Cannon is a 79 y.o. female who presents to the emergency department today because of concerns for continued shortness of breath. The patient states that she was recently discharged from the hospital 3 days ago after being admitted for pneumonia and having fluid on her lungs. She states that when she first went home she was doing okay but gradually her breathing is gotten worse. She has had to bump her oxygen up to 4 L from her normal 2 L. She has a pulling sensation of her chest during this time. It is central. It is intermittent in intensity. The time of my examination she states it feels much better. She denies any fevers. Denies any productive cough.   Past Medical History  Diagnosis Date  . COPD (chronic obstructive pulmonary disease) ([place])   . Cancer ([place])   . Memory deficit   . Celiac disease     There are no active problems to display for this patient.   Past Surgical History  Procedure Laterality Date  . Lung lobectomy Right   . Pacemaker insertion      Current Outpatient Rx  Name  Route  Sig  Dispense  Refill  . amoxicillin (AMOXIL) 500 MG capsule   Oral   Take 1 capsule by mouth 2 (two) times daily. For 7 days      0   . amoxicillin-clavulanate (AUGMENTIN) 875-125 MG tablet   Oral   Take 1 tablet by mouth 2 (two) times daily. For 7 days         . apixaban (ELIQUIS) 5 MG TABS tablet   Oral   Take 5 mg by mouth 2 (two) times daily.         Marland Kitchen docusate sodium (COLACE) 100 MG capsule   Oral   Take 100 mg by mouth 2 (two) times daily.         Marland Kitchen donepezil (ARICEPT) 10 MG tablet   Oral   Take 10 mg by mouth at bedtime.         . flecainide (TAMBOCOR) 50 MG  tablet   Oral   Take 1.5 mg by mouth 2 (two) times daily.         Marland Kitchen gabapentin (NEURONTIN) 300 MG capsule   Oral   Take 1,200 mg by mouth 2 (two) times daily.         . metoprolol succinate (TOPROL-XL) 25 MG 24 hr tablet   Oral   Take 12.5 mg by mouth daily.           Allergies Gluten meal  History reviewed. No pertinent family history.  Social History Social History  Substance Use Topics  . Smoking status: Former Research scientist (life sciences)  . Smokeless tobacco: None  . Alcohol Use: Yes     Comment: social    Review of Systems  Constitutional: Negative for fever. Cardiovascular: Positive for chest pain. Respiratory: Positive for shortness of breath. Gastrointestinal: Negative for abdominal pain, vomiting and diarrhea. Neurological: Negative for headaches, focal weakness or numbness.   10-point ROS otherwise negative.  ____________________________________________   PHYSICAL EXAM:  VITAL SIGNS: ED Triage Vitals  Enc Vitals Group     BP 11/15/15 0231 167/75 mmHg     Pulse Rate 11/15/15  0231 99     Resp 11/15/15 0231 22     Temp 11/15/15 0231 98.2 F (36.8 C)     Temp Source 11/15/15 0231 Oral     SpO2 11/15/15 0231 98 %     Weight 11/15/15 0231 115 lb (52.164 kg)     Height 11/15/15 0231 '5\' 2"'$  (1.575 m)     Head Cir --      Peak Flow --      Pain Score 11/15/15 0323 0   Constitutional: Alert and oriented. Well appearing and in no distress. Eyes: Conjunctivae are normal. PERRL. Normal extraocular movements. ENT   Head: Normocephalic and atraumatic.   Nose: No congestion/rhinnorhea.   Mouth/Throat: Mucous membranes are moist.   Neck: No stridor. Hematological/Lymphatic/Immunilogical: No cervical lymphadenopathy. Cardiovascular: Tachycardic, regular rhythm.  No murmurs, rubs, or gallops. Respiratory: Normal respiratory effort without tachypnea nor retractions. Breath sounds are clear and equal bilaterally. No wheezes/rales/rhonchi. Gastrointestinal: Soft  and nontender. No distention.  Genitourinary: Deferred Musculoskeletal: Normal range of motion in all extremities. No joint effusions.  No lower extremity tenderness nor edema. Neurologic:  Normal speech and language. No gross focal neurologic deficits are appreciated.  Skin:  Skin is warm, dry and intact. No rash noted. Psychiatric: Mood and affect are normal. Speech and behavior are normal. Patient exhibits appropriate insight and judgment.  ____________________________________________    LABS (pertinent positives/negatives)  Labs Reviewed  CBC WITH DIFFERENTIAL/PLATELET - Abnormal; Notable for the following:    RBC 3.43 (*)    MCV 106.3 (*)    MCH 36.0 (*)    RDW 15.3 (*)    Platelets 149 (*)    Neutro Abs 8.1 (*)    Lymphs Abs 0.4 (*)    All other components within normal limits  BASIC METABOLIC PANEL - Abnormal; Notable for the following:    Glucose, Bld 117 (*)    All other components within normal limits  TROPONIN I     ____________________________________________   EKG  I, Nance Pear, attending physician, personally viewed and interpreted this EKG  EKG Time: 0300 Rate: 102 Rhythm: sinus tachycardia Axis: left axis deviation Intervals: qtc 463 QRS: narrow ST changes: no st elevation Impression: abnormal ekg   I, Nance Pear, attending physician, personally viewed and interpreted this EKG  EKG Time: 0359 Rate: 102 Rhythm: wide complex tachycardia Axis: extreme axis deviation Intervals: qtc 568 QRS: RBBB ST changes: st elevation III Impression: abnormal ekg  I, Nance Pear, attending physician, personally viewed and interpreted this EKG  EKG Time: 0453 Rate: 103 Rhythm: ventricular paced rhythm Axis: extreme axis deviation Intervals: qtc 578 QRS: wide ST changes: no st elevation equivalent Impression: abnormal ekg  ____________________________________________    RADIOLOGY  cxr IMPRESSION: Small right pleural effusion noted.  Increased interstitial markings may reflect mildly asymmetric interstitial edema or possibly pneumonia.   ____________________________________________   PROCEDURES  Procedure(s) performed: None  Critical Care performed: No  ____________________________________________   INITIAL IMPRESSION / ASSESSMENT AND PLAN / ED COURSE  Pertinent labs & imaging results that were available during my care of the patient were reviewed by me and considered in my medical decision making (see chart for details).  Patient presented to the emergency department today with concerns for shortness of breath. Patient was recently discharged from the hospital secondary to a pneumonia. The patient states that since she's got home her breathing is gone worse. She has had to go up to 4 L of oxygen over her baseline of 2 L.  While here in the emergency department the patient initially had an EKG with a narrow complex sinus tachycardia. The monitor then showed a wide complex tachycardia. An EKG was obtained which again showed a wide complex tachycardia. At this point the patient was stable in terms of blood pressure and clinical signs. I did ask that amiodarone could be given. After the amiodarone was given was realized that the monitor was not set to a pacer setting which I was not aware of existing. Once the setting was changed pacer spikes were clearly evident. Given this finding I have less concern about the wide complex tachycardia. However given patient's tachycardia and tachypnea will plan on admitting for shortness of breath and potential pneumonia.  ____________________________________________   FINAL CLINICAL IMPRESSION(S) / ED DIAGNOSES  Final diagnoses:  Shortness of breath     Nance Pear, MD 11/15/15 [phone]

## 2015-11-15 NOTE — H&P (Signed)
[facility] [place] NAME: Michalla Ringer    MR#:  [id number]  DATE OF BIRTH:  February 01, 1936  DATE OF ADMISSION:  11/15/2015  PRIMARY CARE PHYSICIAN: Glendon Axe, MD   REQUESTING/REFERRING PHYSICIAN:   CHIEF COMPLAINT:   Chief Complaint  Patient presents with  . Shortness of Breath    pt was seen here 3 days ago admitted with pneumonia dc home thursday did fine then but last night had increased shortness of breath.     HISTORY OF PRESENT ILLNESS: Evalynn Hankins  is a 79 y.o. female with a known history of pneumonia, COPD, celiac disease, atrial fibrillation on oral eliquis presented to the emergency room with increased shortness of breath since yesterday. Patient was discharged to home on Thursday evening from the hospital after being treated for pneumonia. Patient continued to be short of breath since yesterday evening. At home patient was taking oral Augmentin. Upon evaluation in the emergency room chest x-ray showed persistent right lung pneumonia and congestion. Has cough which is productive of phlegm. No fever or chills. No chest pain. No orthopnea. No headache dizziness or blurry vision. No history of sick contacts at home.  PAST MEDICAL HISTORY:   Past Medical History  Diagnosis Date  . COPD (chronic obstructive pulmonary disease) ([place])   . Cancer ([place])   . Memory deficit   . Celiac disease   . A-fib [facility])     PAST SURGICAL HISTORY:  Past Surgical History  Procedure Laterality Date  . Lung lobectomy Right   . Pacemaker insertion      SOCIAL HISTORY:  Social History  Substance Use Topics  . Smoking status: Former Research scientist (life sciences)  . Smokeless tobacco: Not on file  . Alcohol Use: Yes     Comment: social    FAMILY HISTORY:  Family History  Problem Relation Age of Onset  . Cancer Mother   . Cancer Sister     DRUG ALLERGIES:  Allergies  Allergen Reactions  . Gluten Meal Other (See Comments)    GI UPSET    REVIEW OF  SYSTEMS:   CONSTITUTIONAL: No fever, fatigue or weakness.  EYES: No blurred or double vision.  EARS, NOSE, AND THROAT: No tinnitus or ear pain.  RESPIRATORY: has cough, shortness of breath present, no wheezing or hemoptysis.  CARDIOVASCULAR: No chest pain, no orthopnea, edema.  GASTROINTESTINAL: No nausea, vomiting, diarrhea or abdominal pain.  GENITOURINARY: No dysuria, hematuria.  ENDOCRINE: No polyuria, nocturia,  HEMATOLOGY: No anemia, easy bruising or bleeding SKIN: No rash or lesion. MUSCULOSKELETAL: No joint pain or arthritis.   NEUROLOGIC: No tingling, numbness, weakness.  PSYCHIATRY: No anxiety or depression.   MEDICATIONS AT HOME:  Prior to Admission medications   Medication Sig Start Date End Date Taking? Authorizing Provider  amoxicillin (AMOXIL) 500 MG capsule Take 1 capsule by mouth 2 (two) times daily. For 7 days 11/04/15  Yes Historical Provider, MD  amoxicillin-clavulanate (AUGMENTIN) 875-125 MG tablet Take 1 tablet by mouth 2 (two) times daily. For 7 days   Yes Historical Provider, MD  apixaban (ELIQUIS) 5 MG TABS tablet Take 5 mg by mouth 2 (two) times daily.   Yes Historical Provider, MD  docusate sodium (COLACE) 100 MG capsule Take 100 mg by mouth 2 (two) times daily.   Yes Historical Provider, MD  donepezil (ARICEPT) 10 MG tablet Take 10 mg by mouth at bedtime.   Yes Historical Provider, MD  flecainide (TAMBOCOR) 50 MG tablet Take 1.5 mg by  mouth 2 (two) times daily.   Yes Historical Provider, MD  gabapentin (NEURONTIN) 300 MG capsule Take 1,200 mg by mouth 2 (two) times daily.   Yes Historical Provider, MD  metoprolol succinate (TOPROL-XL) 25 MG 24 hr tablet Take 12.5 mg by mouth daily.   Yes Historical Provider, MD  pantoprazole (PROTONIX) 40 MG tablet Take 40 mg by mouth daily.   Yes Historical Provider, MD      PHYSICAL EXAMINATION:   VITAL SIGNS: Blood pressure 112/75, pulse 107, temperature 98.2 F (36.8 C), temperature source Oral, resp. rate 20, height  '5\' 2"'$  (1.575 m), weight 52.164 kg (115 lb), SpO2 97 %.  GENERAL:  79 y.o.-year-old patient lying in the bed with no acute distress.  EYES: Pupils equal, round, reactive to light and accommodation. No scleral icterus. Extraocular muscles intact.  HEENT: Head atraumatic, normocephalic. Oropharynx and nasopharynx clear.  NECK:  Supple, no jugular venous distention. No thyroid enlargement, no tenderness.  LUNGS: Decreased breath sounds bilaterally, no wheezing, rales in the right lung,no rhonchi or crepitation. No use of accessory muscles of respiration.  CARDIOVASCULAR: S1, S2 normal. No murmurs, rubs, or gallops.  ABDOMEN: Soft, nontender, nondistended. Bowel sounds present. No organomegaly or mass.  EXTREMITIES: No pedal edema, cyanosis, or clubbing.  NEUROLOGIC: Cranial nerves II through XII are intact. Muscle strength 5/5 in all extremities. Sensation intact. Gait not checked.  PSYCHIATRIC: The patient is alert and oriented x 3.  SKIN: No obvious rash, lesion, or ulcer.   LABORATORY PANEL:   CBC  Recent Labs Lab 11/15/15 0336  WBC 9.1  HGB 12.3  HCT 36.5  PLT 149*  MCV 106.3*  MCH 36.0*  MCHC 33.8  RDW 15.3*  LYMPHSABS 0.4*  MONOABS 0.5  EOSABS 0.1  BASOSABS 0.0   ------------------------------------------------------------------------------------------------------------------  Chemistries   Recent Labs Lab 11/15/15 0336  NA 136  K 4.2  CL 101  CO2 26  GLUCOSE 117*  BUN 12  CREATININE 0.77  CALCIUM 9.1   ------------------------------------------------------------------------------------------------------------------ estimated creatinine clearance is 46.6 mL/min (by C-G formula based on Cr of 0.77). ------------------------------------------------------------------------------------------------------------------ No results for input(s): TSH, T4TOTAL, T3FREE, THYROIDAB in the last 72 hours.  Invalid input(s): FREET3   Coagulation profile No results for  input(s): INR, PROTIME in the last 168 hours. ------------------------------------------------------------------------------------------------------------------- No results for input(s): DDIMER in the last 72 hours. -------------------------------------------------------------------------------------------------------------------  Cardiac Enzymes  Recent Labs Lab 11/15/15 0336  TROPONINI <0.03   ------------------------------------------------------------------------------------------------------------------ Invalid input(s): POCBNP  ---------------------------------------------------------------------------------------------------------------  Urinalysis No results found for: COLORURINE, APPEARANCEUR, LABSPEC, PHURINE, GLUCOSEU, HGBUR, BILIRUBINUR, KETONESUR, PROTEINUR, UROBILINOGEN, NITRITE, LEUKOCYTESUR   RADIOLOGY: Dg Chest 2 View  11/15/2015  CLINICAL DATA:  Acute onset of worsening shortness of breath. Initial encounter. EXAM: CHEST  2 VIEW COMPARISON:  None. FINDINGS: A small right pleural effusion is noted. Increased interstitial markings may reflect mildly asymmetric interstitial edema or possibly pneumonia. No pneumothorax is seen. The cardiomediastinal silhouette is borderline normal in size. No acute osseous abnormalities are identified. A pacemaker is noted at the left chest wall, with leads ending at the right atrium and right ventricle. Left convex lumbar scoliosis is noted. IMPRESSION: Small right pleural effusion noted. Increased interstitial markings may reflect mildly asymmetric interstitial edema or possibly pneumonia. Electronically Signed   By: Garald Balding M.D.   On: 11/15/2015 03:15    EKG: Orders placed or performed during the hospital encounter of 11/15/15  . ED EKG  . ED EKG  . EKG 12-Lead  . EKG 12-Lead  . EKG 12-Lead  .  EKG 12-Lead  . EKG 12-Lead  . EKG 12-Lead  . EKG 12-Lead  . EKG 12-Lead    IMPRESSION AND PLAN: 1. Healthcare associated  pneumonia 2. Dyspnea 3. Chronic atrial fibrillation 4. COPD Treatment plan 1. Continue oxygen via nasal cannula 2. Start patient on IV vancomycin and IV cefepime antibiotic 3. Diurese patient with Lasix as needed 4. Continue oral eliquis 5.Neb treatments  All the records are reviewed and case discussed with ED provider. Management plans discussed with the patient, family and they are in agreement.  CODE STATUS:FULL    TOTAL TIME TAKING CARE OF THIS PATIENT: 36 minutes.    Saundra Shelling M.D on 11/15/2015 at 6:57 AM  Between 7am to 6pm - Pager - [phone]  After 6pm go to [email or web address] - password [email or web address] [facility]  Office  [phone]  CC: Primary care physician; Glendon Axe, MD

## 2015-11-15 NOTE — ED Notes (Signed)
New orders acknowledged by this RN.

## 2015-11-15 NOTE — ED Notes (Signed)
Admitting MD at bedside.

## 2015-11-15 NOTE — ED Notes (Signed)
Pt reports that after her lobectomy she was told she went into a period of afib.  Pt states: "they said I went into afib and my heart was missing beats.  They gave me a pacemaker"

## 2015-11-15 NOTE — ED Notes (Signed)
MD made aware of recent abnormal heart rhythm on cardiac monitor noticed by this RN. ED EKG printed and obtained for MD.

## 2015-11-15 NOTE — ED Notes (Signed)
pt was seen here 3 days ago admitted with pneumonia dc home thursday did fine then but last night had increased shortness of breath.

## 2015-11-16 LAB — CBC
HEMATOCRIT: 30.4 % — AB (ref 35.0–47.0)
HEMOGLOBIN: 10.2 g/dL — AB (ref 12.0–16.0)
MCH: 35 pg — AB (ref 26.0–34.0)
MCHC: 33.7 g/dL (ref 32.0–36.0)
MCV: 103.9 fL — AB (ref 80.0–100.0)
Platelets: 149 10*3/uL — ABNORMAL LOW (ref 150–440)
RBC: 2.92 MIL/uL — AB (ref 3.80–5.20)
RDW: 15.1 % — ABNORMAL HIGH (ref 11.5–14.5)
WBC: 5.6 10*3/uL (ref 3.6–11.0)

## 2015-11-16 LAB — BASIC METABOLIC PANEL
Anion gap: 7 (ref 5–15)
BUN: 10 mg/dL (ref 6–20)
CHLORIDE: 102 mmol/L (ref 101–111)
CO2: 29 mmol/L (ref 22–32)
CREATININE: 0.81 mg/dL (ref 0.44–1.00)
Calcium: 8.3 mg/dL — ABNORMAL LOW (ref 8.9–10.3)
GFR calc Af Amer: >60 mL/min (ref >60)
GFR calc non Af Amer: >60 mL/min (ref >60)
GLUCOSE: 102 mg/dL — AB (ref 65–99)
POTASSIUM: 3.4 mmol/L — AB (ref 3.5–5.1)
Sodium: 138 mmol/L (ref 135–145)

## 2015-11-16 LAB — VITAMIN B12: VITAMIN B 12: 869 pg/mL (ref 180–914)

## 2015-11-16 MED ORDER — LEVOFLOXACIN IN D5W 750 MG/150ML IV SOLN
750.0000 mg | INTRAVENOUS | Status: DC
  Administered 2015-11-17: 750 mg via INTRAVENOUS
  Filled 2015-11-16: qty 150

## 2015-11-16 MED ORDER — VANCOMYCIN HCL IN DEXTROSE 750-5 MG/150ML-% IV SOLN
750.0000 mg | INTRAVENOUS | Status: DC
  Filled 2015-11-16: qty 150

## 2015-11-16 NOTE — Progress Notes (Signed)
[facility] [place] NAME: Cheryl Cannon    MR#:  [id number]  DATE OF BIRTH:  10/02/1936  SUBJECTIVE:  CHIEF COMPLAINT:   Chief Complaint  Patient presents with  . Shortness of Breath    pt was seen here 3 days ago admitted with pneumonia dc home thursday did fine then but last night had increased shortness of breath.    Shortness of breath overnight. States that this morning she is feeling fine. Some coughing with sputum  REVIEW OF SYSTEMS:   Review of Systems  Constitutional: Negative for fever.  Respiratory: Positive for cough, sputum production and shortness of breath.   Cardiovascular: Negative for chest pain and palpitations.  Gastrointestinal: Negative for nausea, vomiting and abdominal pain.  Genitourinary: Negative for dysuria.    DRUG ALLERGIES:   Allergies  Allergen Reactions  . Gluten Meal Other (See Comments)    GI UPSET    VITALS:  Blood pressure 97/56, pulse 70, temperature 97.9 F (36.6 C), temperature source Oral, resp. rate 18, height '5\' 2"'$  (1.575 m), weight 52.164 kg (115 lb), SpO2 98 %.  PHYSICAL EXAMINATION:  GENERAL:  79 y.o.-year-old patient lying in the bed with no acute distress.  LUNGS: Diffuse crackles and rhonchi, fair air movement, no respiratory distress  CARDIOVASCULAR: Irregular, S1, S2 normal. No murmurs, rubs, or gallops.  ABDOMEN: Soft, nontender, nondistended. Bowel sounds present. No organomegaly or mass.  EXTREMITIES: No pedal edema, cyanosis, or clubbing.  NEUROLOGIC: Cranial nerves II through XII are intact. Muscle strength 5/5 in all extremities. Sensation intact. Gait not checked.  PSYCHIATRIC: The patient is alert and oriented x 3.  SKIN: No obvious rash, lesion, or ulcer.    LABORATORY PANEL:   CBC  Recent Labs Lab 11/16/15 0415  WBC 5.6  HGB 10.2*  HCT 30.4*  PLT 149*    ------------------------------------------------------------------------------------------------------------------  Chemistries   Recent Labs Lab 11/16/15 0415  NA 138  K 3.4*  CL 102  CO2 29  GLUCOSE 102*  BUN 10  CREATININE 0.81  CALCIUM 8.3*   ------------------------------------------------------------------------------------------------------------------  Cardiac Enzymes  Recent Labs Lab 11/15/15 0336  TROPONINI <0.03   ------------------------------------------------------------------------------------------------------------------  RADIOLOGY:  Dg Chest 2 View  11/15/2015  CLINICAL DATA:  Acute onset of worsening shortness of breath. Initial encounter. EXAM: CHEST  2 VIEW COMPARISON:  None. FINDINGS: A small right pleural effusion is noted. Increased interstitial markings may reflect mildly asymmetric interstitial edema or possibly pneumonia. No pneumothorax is seen. The cardiomediastinal silhouette is borderline normal in size. No acute osseous abnormalities are identified. A pacemaker is noted at the left chest wall, with leads ending at the right atrium and right ventricle. Left convex lumbar scoliosis is noted. IMPRESSION: Small right pleural effusion noted. Increased interstitial markings may reflect mildly asymmetric interstitial edema or possibly pneumonia. Electronically Signed   By: Garald Balding M.D.   On: 11/15/2015 03:15    EKG:   Orders placed or performed during the hospital encounter of 11/15/15  . ED EKG  . ED EKG  . EKG 12-Lead  . EKG 12-Lead  . EKG 12-Lead  . EKG 12-Lead  . EKG 12-Lead  . EKG 12-Lead  . EKG 12-Lead  . EKG 12-Lead    ASSESSMENT AND PLAN:   1 healthcare associated pneumonia - Continue to be short of breath. - Has had 24 hours of vancomycin, cefepime, Levaquin>> will simplify to Levaquin  2 acute respiratory failure with hypoxia - Continue to require supplemental  oxygen - Also seems to have some pleural edema  3  atrial fibrillation, chronic - Rate controlled, continue eliquis, decreased dose of metoprolol due to hypotension but continue flecainide  4 COPD - Continue breathing treatments as needed  All the records are reviewed and case discussed with Care Management/Social Workerr. Management plans discussed with the patient, family and they are in agreement.  CODE STATUS: Full  TOTAL TIME TAKING CARE OF THIS PATIENT: 25 minutes.  Greater than 50% of time spent in care coordination and counseling. POSSIBLE D/C IN 2-3 DAYS, DEPENDING ON CLINICAL CONDITION.   Myrtis Ser M.D on 11/16/2015 at 1:39 PM  Between 7am to 6pm - Pager - [phone]  After 6pm go to [email or web address] - password [email or web address] [facility]  Office  [phone]  CC: Primary care physician; Glendon Axe, MD

## 2015-11-16 NOTE — Progress Notes (Signed)
ANTIBIOTIC CONSULT NOTE - Follow up  Pharmacy Consult for Vancomycin/Renal dose adjust abx (Cefepime)  Indication: pneumonia (HCAP)/sepsis  Allergies  Allergen Reactions  . Gluten Meal Other (See Comments)    GI UPSET    Patient Measurements: Height: '5\' 2"'$  (157.5 cm) Weight: 115 lb (52.164 kg) IBW/kg (Calculated) : 50.1 Adjusted Body Weight: 50.9 kg   Vital Signs: Temp: 97.9 F (36.6 C) (12/25 1101) Temp Source: Oral (12/25 1101) BP: 97/56 mmHg (12/25 1101) Pulse Rate: 70 (12/25 1101) Intake/Output from previous day: 12/24 0701 - 12/25 0700 In: 563.8 [P.O.:360; I.V.:153.8; IV Piggyback:50] Out: 1960 [XBDZH:2992] Intake/Output from this shift:    Labs:  Recent Labs  11/15/15 0336 11/16/15 0415  WBC 9.1 5.6  HGB 12.3 10.2*  PLT 149* 149*  CREATININE 0.77 0.81   Estimated Creatinine Clearance: 46 mL/min (by C-G formula based on Cr of 0.81). No results for input(s): VANCOTROUGH, VANCOPEAK, VANCORANDOM, GENTTROUGH, GENTPEAK, GENTRANDOM, TOBRATROUGH, TOBRAPEAK, TOBRARND, AMIKACINPEAK, AMIKACINTROU, AMIKACIN in the last 72 hours.   Microbiology: Recent Results (from the past 720 hour(s))  Culture, blood (routine x 2) Call MD if unable to obtain prior to antibiotics being given     Status: None (Preliminary result)   Collection Time: 11/15/15 10:17 AM  Result Value Ref Range Status   Specimen Description BLOOD LEFT HAND  Final   Special Requests BOTTLES DRAWN AEROBIC AND ANAEROBIC  1CC  Final   Culture NO GROWTH < 24 HOURS  Final   Report Status PENDING  Incomplete  Culture, blood (routine x 2) Call MD if unable to obtain prior to antibiotics being given     Status: None (Preliminary result)   Collection Time: 11/15/15 10:17 AM  Result Value Ref Range Status   Specimen Description BLOOD RIGHT AC  Final   Special Requests BOTTLES DRAWN AEROBIC AND ANAEROBIC  10CC  Final   Culture NO GROWTH < 24 HOURS  Final   Report Status PENDING  Incomplete   Assessment: 79 yo  female recently discharge after treating PNA returns with shortness of breath to start vancomycin and cefepime for HCAP.   CrCl = 46.6 ml/min Ke = 0.04 hr-1 T1/2 = 17.3 hrs Vd = 36.5 L    Goal of Therapy:  Vancomycin trough level 15-20 mcg/ml  Plan:  Expected duration 7 days with resolution of temperature and/or normalization of WBC   Will continue Cefepime 1 gm IV Q12H based on current renal function.  Vancomycin 750 mg IV X 1 given on 12/24 @ 13:00. Vancomycin 750 mg IV Q24H ordered to start on 12/24 @ 22:00 ,  9 hrs after 1st dose (stacked dosing). 12/25:  Based on current dosing, est trough ~ 12. Will adjust dose to vancomycin 750 mg IV q18h for goal trough ~18.  Will draw 1st trough on 12/27 @ 21:30, which will be at [facility].  Will need to continue to follow renal function. If renal function worsens, may need to get trough earlier or adjust dosing.  Will need to follow up on culture results and narrow abx as warranted.   Pharmacy will continue to follow.   Rayna Sexton L 11/16/2015,11:05 AM

## 2015-11-16 NOTE — Progress Notes (Signed)
ANTIBIOTIC CONSULT NOTE - INITIAL  Pharmacy Consult for Levaquin Indication: pneumonia  Allergies  Allergen Reactions  . Gluten Meal Other (See Comments)    GI UPSET    Patient Measurements: Height: '5\' 2"'$  (157.5 cm) Weight: 115 lb (52.164 kg) IBW/kg (Calculated) : 50.1  Vital Signs: Temp: 97.6 F (36.4 C) (12/25 1509) Temp Source: Oral (12/25 1509) BP: 111/58 mmHg (12/25 1509) Pulse Rate: 66 (12/25 1509) Intake/Output from previous day: 12/24 0701 - 12/25 0700 In: 563.8 [P.O.:360; I.V.:153.8; IV Piggyback:50] Out: 1960 [Urine:1960] Intake/Output from this shift: Total I/O In: 340 [P.O.:240; IV Piggyback:100] Out: 0   Labs:  Recent Labs  11/15/15 0336 11/16/15 0415  WBC 9.1 5.6  HGB 12.3 10.2*  PLT 149* 149*  CREATININE 0.77 0.81   Estimated Creatinine Clearance: 46 mL/min (by C-G formula based on Cr of 0.81). No results for input(s): VANCOTROUGH, VANCOPEAK, VANCORANDOM, GENTTROUGH, GENTPEAK, GENTRANDOM, TOBRATROUGH, TOBRAPEAK, TOBRARND, AMIKACINPEAK, AMIKACINTROU, AMIKACIN in the last 72 hours.   Microbiology: Recent Results (from the past 720 hour(s))  Culture, blood (routine x 2) Call MD if unable to obtain prior to antibiotics being given     Status: None (Preliminary result)   Collection Time: 11/15/15 10:17 AM  Result Value Ref Range Status   Specimen Description BLOOD LEFT HAND  Final   Special Requests BOTTLES DRAWN AEROBIC AND ANAEROBIC  1CC  Final   Culture NO GROWTH < 24 HOURS  Final   Report Status PENDING  Incomplete  Culture, blood (routine x 2) Call MD if unable to obtain prior to antibiotics being given     Status: None (Preliminary result)   Collection Time: 11/15/15 10:17 AM  Result Value Ref Range Status   Specimen Description BLOOD RIGHT AC  Final   Special Requests BOTTLES DRAWN AEROBIC AND ANAEROBIC  10CC  Final   Culture NO GROWTH < 24 HOURS  Final   Report Status PENDING  Incomplete    Medical History: Past Medical History   Diagnosis Date  . COPD (chronic obstructive pulmonary disease) ([place])   . Cancer ([place])   . Memory deficit   . Celiac disease   . A-fib ([facility])     Medications:  Scheduled:  . apixaban  5 mg Oral BID  . docusate sodium  100 mg Oral BID  . donepezil  10 mg Oral QHS  . flecainide  75 mg Oral BID  . gabapentin  1,200 mg Oral BID  . ipratropium-albuterol  3 mL Nebulization Q6H  . [START ON 11/17/2015] levofloxacin (LEVAQUIN) IV  750 mg Intravenous Q48H  . metoprolol succinate  12.5 mg Oral Daily  . pantoprazole  40 mg Oral Daily  . sodium chloride  3 mL Intravenous Q12H  . trimethoprim-polymyxin b  2 drop Both Eyes 6 times per day   Infusions:    Assessment: 79 y/o F with HCAP on vancomycin and cefepime to change abx to Levaquin only.   Plan:  Levaquin 750 mg iv once given on 12/24. Will begin Levaqiun 750 mg iv q 48 hours 12/26. Will continue to follow renal function and culture results.   Ulice Dash D 11/16/2015,4:01 PM

## 2015-11-17 ENCOUNTER — Inpatient Hospital Stay: Payer: Medicare Other

## 2015-11-17 LAB — CULTURE, BLOOD (ROUTINE X 2)
CULTURE: NO GROWTH
Culture: NO GROWTH

## 2015-11-17 LAB — BASIC METABOLIC PANEL
Anion gap: 7 (ref 5–15)
BUN: 8 mg/dL (ref 6–20)
CHLORIDE: 103 mmol/L (ref 101–111)
CO2: 28 mmol/L (ref 22–32)
CREATININE: 0.75 mg/dL (ref 0.44–1.00)
Calcium: 8.6 mg/dL — ABNORMAL LOW (ref 8.9–10.3)
GFR calc non Af Amer: >60 mL/min (ref >60)
GLUCOSE: 107 mg/dL — AB (ref 65–99)
Potassium: 3.5 mmol/L (ref 3.5–5.1)
Sodium: 138 mmol/L (ref 135–145)

## 2015-11-17 LAB — CBC
HEMATOCRIT: 29.6 % — AB (ref 35.0–47.0)
HEMOGLOBIN: 10.1 g/dL — AB (ref 12.0–16.0)
MCH: 36 pg — AB (ref 26.0–34.0)
MCHC: 34 g/dL (ref 32.0–36.0)
MCV: 105.8 fL — AB (ref 80.0–100.0)
Platelets: 126 10*3/uL — ABNORMAL LOW (ref 150–440)
RBC: 2.8 MIL/uL — ABNORMAL LOW (ref 3.80–5.20)
RDW: 15.2 % — ABNORMAL HIGH (ref 11.5–14.5)
WBC: 4.9 10*3/uL (ref 3.6–11.0)

## 2015-11-17 MED ORDER — DONEPEZIL HCL 5 MG PO TABS
10.0000 mg | ORAL_TABLET | Freq: Every day | ORAL | Status: DC
  Filled 2015-11-17: qty 2

## 2015-11-17 MED ORDER — FUROSEMIDE 20 MG PO TABS: 20.0000 mg | ORAL_TABLET | Freq: Every day | ORAL | Status: AC

## 2015-11-17 MED ORDER — FUROSEMIDE 20 MG PO TABS
20.0000 mg | ORAL_TABLET | Freq: Every day | ORAL | Status: DC
  Administered 2015-11-17: 20 mg via ORAL
  Filled 2015-11-17: qty 1

## 2015-11-17 MED ORDER — FUROSEMIDE 10 MG/ML IJ SOLN: 20.0000 mg | Freq: Once | INTRAMUSCULAR | Status: DC

## 2015-11-17 NOTE — Evaluation (Signed)
Physical Therapy Evaluation Patient Details Name: Cheryl Cannon MRN: [id number] DOB: 07-Jan-1936 Today's Date: 11/17/2015   History of Present Illness  Pt is a 79 y.o. female presenting to hospital with SOB.  Pt with recent hospital admission for PNA (small PE noted on CTA last admission).  Pt admitted with healthcare associated PNA and acute respiratory failure with hypoxia.  Pt reports being on home O2 for past 2 weeks (has home O2 set-up).  PMH includes PNA, COPD, a-fib on oral eliquis, memory deficit, lung lobectomy, pacemaker.  Clinical Impression  Prior to admission, pt was independent and using home O2 for past 2 weeks.  Pt lives with her husband at the [facility].  Currently pt is SBA with transfers and ambulation without AD.  No loss of balance noted during session with functional mobility.  Pt's O2 did decrease to 85-86% on 2 L/min via nasal cannula with ambulation (returned to 95% on 2 L/min via nasal cannula within a couple minutes rest break).  Pt would benefit from skilled PT to address noted impairments and functional limitations.  Recommend pt discharge to home with 24/7 assist from husband (for O2 tubing management) when medically appropriate.     Follow Up Recommendations Supervision/Assistance - 24 hour (Home (may benefit from OP pulmonary rehab))    Equipment Recommendations       Recommendations for Other Services       Precautions / Restrictions Precautions Precautions: Fall Restrictions Weight Bearing Restrictions: No      Mobility  Bed Mobility Overal bed mobility: Modified Independent Bed Mobility: Supine to Sit;Sit to Supine     Supine to sit: Modified independent (Device/Increase time);HOB elevated Sit to supine: Modified independent (Device/Increase time);HOB elevated      Transfers Overall transfer level: Needs assistance Equipment used: None Transfers: Sit to/from Stand Sit to Stand: Supervision         General  transfer comment: SBA for O2 tubing management; steady without loss of balance  Ambulation/Gait Ambulation/Gait assistance: Supervision Ambulation Distance (Feet): 200 Feet Assistive device: None Gait Pattern/deviations: WFL(Within Functional Limits)   Gait velocity interpretation: at or above normal speed for age/gender General Gait Details: Pt desaturated to 85-86% O2 after 100 feet ambulating (returned to 95% within 1-2 minutes of standing rest); pt again desaturated end of next 100 feet to 85-86% (returned to 95% within 1-2 minutes of sitting rest).  Pt on 2 L/min O2 via nasal cannula throughout session.  Stairs            Wheelchair Mobility    Modified Rankin (Stroke Patients Only)       Balance Overall balance assessment: Modified Independent                               Standardized Balance Assessment Standardized Balance Assessment :  (Tinetti balance assessment:  27/28 score (0/1 on eyes closed: pt took a step back but able to self correct))           Pertinent Vitals/Pain Pain Assessment: No/denies pain    Home Living Family/patient expects to be discharged to:: Private residence Living Arrangements: Spouse/significant other Available Help at Discharge: Family Type of Home: Independent living facility ([facility]) Home Access: Level entry     Home Layout: One level Home Equipment: None      Prior Function Level of Independence: Independent         Comments: Pt recently placed on  2 L/min home O2 (about 2 weeks).  Pt reports "collapsing" about 2 weeks ago but no other falls.     Hand Dominance        Extremity/Trunk Assessment   Upper Extremity Assessment: Overall WFL for tasks assessed           Lower Extremity Assessment: Overall WFL for tasks assessed         Communication   Communication: No difficulties  Cognition Arousal/Alertness: Awake/alert Behavior During Therapy: WFL for tasks  assessed/performed Overall Cognitive Status: History of cognitive impairments - at baseline       Memory: Decreased short-term memory              General Comments   Nursing cleared pt for participation in physical therapy.  Pt agreeable to PT session. Pt's husband present during session.    Exercises        Assessment/Plan    PT Assessment Patient needs continued PT services  PT Diagnosis  (Pulmonary impairment)   PT Problem List Cardiopulmonary status limiting activity  PT Treatment Interventions DME instruction;Gait training;Functional mobility training;Therapeutic activities;Therapeutic exercise;Balance training;Patient/family education   PT Goals (Current goals can be found in the Care Plan section) Acute Rehab PT Goals Patient Stated Goal: to go home PT Goal Formulation: With patient/family Time For Goal Achievement: 12/01/15 Potential to Achieve Goals: Good    Frequency Min 2X/week   Barriers to discharge        Co-evaluation               End of Session Equipment Utilized During Treatment: Gait belt;Oxygen Activity Tolerance:  (SOB with activity) Patient left: in bed;with call bell/phone within reach;with bed alarm set;with family/visitor present Nurse Communication: Mobility status (O2 desaturation)         Time: 7001-7494 PT Time Calculation (min) (ACUTE ONLY): 25 min   Charges:   PT Evaluation $Initial PT Evaluation Tier I: 1 Procedure     PT G CodesLeitha Bleak 12-06-2015, 9:53 AM Leitha Bleak, [place]

## 2015-11-17 NOTE — Care Management Important Message (Signed)
Important Message  Patient Details  Name: Cheryl Cannon MRN: [id number] Date of Birth: 04/11/1936   Medicare Important Message Given:  Yes    Marshell Garfinkel, RN 11/17/2015, 7:39 AM

## 2015-11-17 NOTE — Discharge Instructions (Signed)
°  DIET:  Cardiac diet   DISCHARGE CONDITION:  Fair  ACTIVITY:  Activity as tolerated  OXYGEN:  Home Oxygen: Yes.     Oxygen Delivery: 2 liters/min via Patient connected to nasal cannula oxygen  DISCHARGE LOCATION:  home   If you experience worsening of your admission symptoms, develop shortness of breath, life threatening emergency, suicidal or homicidal thoughts you must seek medical attention immediately by calling 911 or calling your MD immediately  if symptoms less severe.  You Must read complete instructions/literature along with all the possible adverse reactions/side effects for all the Medicines you take and that have been prescribed to you. Take any new Medicines after you have completely understood and accpet all the possible adverse reactions/side effects.   Please note  You were cared for by a hospitalist during your hospital stay. If you have any questions about your discharge medications or the care you received while you were in the hospital after you are discharged, you can call the unit and asked to speak with the hospitalist on call if the hospitalist that took care of you is not available. Once you are discharged, your primary care physician will handle any further medical issues. Please note that NO REFILLS for any discharge medications will be authorized once you are discharged, as it is imperative that you return to your primary care physician (or establish a relationship with a primary care physician if you do not have one) for your aftercare needs so that they can reassess your need for medications and monitor your lab values.

## 2015-11-17 NOTE — Care Management Note (Signed)
Case Management Note  Patient Details  Name: Cheryl Cannon MRN: [id number] Date of Birth: 02-09-1936  Subjective/Objective:    Readmit to [facility] with PNA. Spoke with patient. She is on chronic O2 @ 2 l through [facility]. Lives at home with husband. She does not have any home health services and does not want any home health services.   Denies issues obtaining medications, medical care or other financial concerns. Will close case              Action/Plan:  No needs identified.  Expected Discharge Date:                  Expected Discharge Plan:  Home/Self Care  In-House Referral:     Discharge planning Services  CM Consult  Post Acute Care Choice:    Choice offered to:  Patient  DME Arranged:    DME Agency:     HH Arranged:  Patient Refused [place] Agency:     Status of Service:  Completed, signed off  Medicare Important Message Given:  Yes Date Medicare IM Given:    Medicare IM give by:    Date Additional Medicare IM Given:    Additional Medicare Important Message give by:     If discussed at [place] of Stay Meetings, dates discussed:    Additional Comments:  Jolly Mango, RN 11/17/2015, 1:19 PM

## 2015-11-17 NOTE — Discharge Summary (Signed)
[facility] [place] NAME: Cheryl Cannon    MR#:  [id number]  DATE OF BIRTH:  [birth year]  DATE OF ADMISSION:  11/15/2015 ADMITTING PHYSICIAN: Saundra Shelling, MD  DATE OF DISCHARGE: 11/17/2015  PRIMARY CARE PHYSICIAN: Singh,Jasmine, MD    ADMISSION DIAGNOSIS:  Shortness of breath [R06.02]  DISCHARGE DIAGNOSIS:  Active Problems:   Pneumonia   SECONDARY DIAGNOSIS:   Past Medical History  Diagnosis Date  . COPD (chronic obstructive pulmonary disease) ([place])   . Cancer ([place])   . Memory deficit   . Celiac disease   . A-fib [facility])     HOSPITAL COURSE:   1 healthcare associated pneumonia: Return to the hospital after becoming short of breath after initial admission. She was not more hypoxic than previously. Still requiring 2 L. Nasal cannula. On admission she was treated with vancomycin, cefepime and Levaquin. This was then simplified to Levaquin only. She is being discharged on Augmentin. She will need to complete a 10 day course.  2 acute respiratory failure with hypoxia: Continue to require supplemental oxygen at 2 L. desaturation to 85% with exertion.  3 atrial fibrillation, chronic - Rate controlled, continue eliquis, decreased dose of metoprolol due to hypotension but continue flecainide  4 COPD: continue spiriva and Xopenex inhalers  5. pulmonary edema: Likely due to pulmonary hypertension and mitral valve dysfunction. She was seen by Dr. Towanda Malkin during the hospitalization. He Lasix 20 mg daily. This edema is likely contributing to her current hypoxia   6. GERD: Continue Protonix  7. Mild cognitive dysfunction continue Aricept  DISCHARGE CONDITIONS:   Stable   CONSULTS OBTAINED:  Treatment Team:  Yolonda Kida, MD  DRUG ALLERGIES:   Allergies  Allergen Reactions  . Gluten Meal Other (See Comments)    GI UPSET    DISCHARGE MEDICATIONS:   Current Discharge Medication List     START taking these medications   Details  furosemide (LASIX) 20 MG tablet Take 1 tablet (20 mg total) by mouth daily. Qty: 30 tablet, Refills: 0      CONTINUE these medications which have NOT CHANGED   Details  amoxicillin-clavulanate (AUGMENTIN) 875-125 MG tablet Take 1 tablet by mouth 2 (two) times daily. For 7 days    apixaban (ELIQUIS) 5 MG TABS tablet Take 5 mg by mouth 2 (two) times daily.    docusate sodium (COLACE) 100 MG capsule Take 100 mg by mouth 2 (two) times daily.    donepezil (ARICEPT) 10 MG tablet Take 10 mg by mouth at bedtime.    flecainide (TAMBOCOR) 50 MG tablet Take 1.5 mg by mouth 2 (two) times daily.    gabapentin (NEURONTIN) 300 MG capsule Take 1,200 mg by mouth 2 (two) times daily.    metoprolol succinate (TOPROL-XL) 25 MG 24 hr tablet Take 12.5 mg by mouth daily.    pantoprazole (PROTONIX) 40 MG tablet Take 40 mg by mouth daily.      STOP taking these medications     amoxicillin (AMOXIL) 500 MG capsule          DISCHARGE INSTRUCTIONS:   Stable condition. Will have home oxygen. Will follow up with primary care in 1 week. Will have 24-hour supervision from her husband. Heart healthy diet   If you experience worsening of your admission symptoms, develop shortness of breath, life threatening emergency, suicidal or homicidal thoughts you must seek medical attention immediately by calling 911 or calling your MD immediately  if  symptoms less severe.  You Must read complete instructions/literature along with all the possible adverse reactions/side effects for all the Medicines you take and that have been prescribed to you. Take any new Medicines after you have completely understood and accept all the possible adverse reactions/side effects.   Please note  You were cared for by a hospitalist during your hospital stay. If you have any questions about your discharge medications or the care you received while you were in the hospital after you are  discharged, you can call the unit and asked to speak with the hospitalist on call if the hospitalist that took care of you is not available. Once you are discharged, your primary care physician will handle any further medical issues. Please note that NO REFILLS for any discharge medications will be authorized once you are discharged, as it is imperative that you return to your primary care physician (or establish a relationship with a primary care physician if you do not have one) for your aftercare needs so that they can reassess your need for medications and monitor your lab values.    Today   CHIEF COMPLAINT:   Chief Complaint  Patient presents with  . Shortness of Breath    pt was seen here 3 days ago admitted with pneumonia dc home thursday did fine then but last night had increased shortness of breath.     HISTORY OF PRESENT ILLNESS:  Cheryl Cannon is a 79 y.o. female with a known history of pneumonia, COPD, celiac disease, atrial fibrillation on oral eliquis presented to the emergency room with increased shortness of breath since yesterday. Patient was discharged to home on Thursday evening from the hospital after being treated for pneumonia. Patient continued to be short of breath since yesterday evening. At home patient was taking oral Augmentin. Upon evaluation in the emergency room chest x-ray showed persistent right lung pneumonia and congestion. Has cough which is productive of phlegm. No fever or chills. No chest pain. No orthopnea. No headache dizziness or blurry vision. No history of sick contacts at home.  VITAL SIGNS:  Blood pressure 128/52, pulse 71, temperature 98.2 F (36.8 C), temperature source Oral, resp. rate 18, height '5\' 2"'$  (1.575 m), weight 55.792 kg (123 lb), SpO2 100 %.  I/O:   Intake/Output Summary (Last 24 hours) at 11/17/15 1306 Last data filed at 11/16/15 2226  Gross per 24 hour  Intake    240 ml  Output   1000 ml  Net   -760 ml    PHYSICAL EXAMINATION:   GENERAL:  79 y.o.-year-old patient lying in the bed with no acute distress.  LUNGS: Bibasilar crackles, good air movement, no coughing, O2 in place, no wheezing  CARDIOVASCULAR: S1, S2 normal. No murmurs, rubs, or gallops.  ABDOMEN: Soft, non-tender, non-distended. Bowel sounds present. No organomegaly or mass.  EXTREMITIES: No pedal edema, cyanosis, or clubbing. Pulses 2+  NEUROLOGIC: Cranial nerves II through XII are intact. Muscle strength 5/5 in all extremities. Sensation intact. Gait not checked.  PSYCHIATRIC: The patient is alert and oriented x 3.  SKIN: No obvious rash, lesion, or ulcer.   DATA REVIEW:   CBC  Recent Labs Lab 11/17/15 0315  WBC 4.9  HGB 10.1*  HCT 29.6*  PLT 126*    Chemistries   Recent Labs Lab 11/17/15 0315  NA 138  K 3.5  CL 103  CO2 28  GLUCOSE 107*  BUN 8  CREATININE 0.75  CALCIUM 8.6*    Cardiac Enzymes  Recent Labs Lab 11/15/15 0336  TROPONINI <0.03    Microbiology Results  Results for orders placed or performed during the hospital encounter of 11/15/15  Culture, blood (routine x 2) Call MD if unable to obtain prior to antibiotics being given     Status: None (Preliminary result)   Collection Time: 11/15/15 10:17 AM  Result Value Ref Range Status   Specimen Description BLOOD LEFT HAND  Final   Special Requests BOTTLES DRAWN AEROBIC AND ANAEROBIC  1CC  Final   Culture NO GROWTH < 24 HOURS  Final   Report Status PENDING  Incomplete  Culture, blood (routine x 2) Call MD if unable to obtain prior to antibiotics being given     Status: None (Preliminary result)   Collection Time: 11/15/15 10:17 AM  Result Value Ref Range Status   Specimen Description BLOOD RIGHT AC  Final   Special Requests BOTTLES DRAWN AEROBIC AND ANAEROBIC  10CC  Final   Culture NO GROWTH < 24 HOURS  Final   Report Status PENDING  Incomplete    RADIOLOGY:  Dg Chest 2 View  11/17/2015  CLINICAL DATA:  COPD/pneumonia. History of lung cancer with partial  lung resection. EXAM: CHEST  2 VIEW COMPARISON:  Radiographs 11/15/2015. No other comparison studies available. FINDINGS: Left subclavian pacemaker leads appear unchanged within the right atrium and right ventricle. The heart size and mediastinal contour stable. There is aortic atherosclerosis. There is stable volume loss in the right hemithorax status post partial lung resection. Right-greater-than-left pleural effusions, pulmonary edema and right basilar airspace disease are unchanged from 2 days ago. Thoracolumbar scoliosis noted. IMPRESSION: No significant change from prior study 2 days ago. Persistent evidence of mild edema with small bilateral pleural effusions. Etiology of right base pulmonary opacity uncertain status post partial right lung resection. This could be chronic. Electronically Signed   By: Richardean Sale M.D.   On: 11/17/2015 09:11    EKG:   Orders placed or performed during the hospital encounter of 11/15/15  . ED EKG  . ED EKG  . EKG 12-Lead  . EKG 12-Lead  . EKG 12-Lead  . EKG 12-Lead  . EKG 12-Lead  . EKG 12-Lead  . EKG 12-Lead  . EKG 12-Lead      Management plans discussed with the patient, family and they are in agreement.  CODE STATUS:     Code Status Orders        Start     Ordered   11/15/15 0942  Full code   Continuous     11/15/15 0941      TOTAL TIME TAKING CARE OF THIS PATIENT: 56mnutes.  Greater than 50% of time spent in care coordination and counseling.  WMyrtis SerM.D on 11/17/2015 at 1:06 PM  Between 7am to 6pm - Pager - [phone]  After 6pm go to [email or web address] - password [email or web address]  Office  [phone] CC: Primary care physician; SGlendon Axe MD

## 2015-11-20 DIAGNOSIS — I2699 Other pulmonary embolism without acute cor pulmonale: Secondary | ICD-10-CM | POA: Insufficient documentation

## 2015-11-20 LAB — CULTURE, BLOOD (ROUTINE X 2)
CULTURE: NO GROWTH
CULTURE: NO GROWTH

## 2015-11-20 NOTE — ED Provider Notes (Signed)
[facility] Emergency Department Provider Note  ____________________________________________  Time seen: 12:15 AM  I have reviewed the triage vital signs and the nursing notes.   HISTORY  Chief Complaint Shortness of Breath     HPI Cheryl Cannon is a 79 y.o. female presents with progressive dyspnea times one day. Of note patient has a history of lung cancer for which she was recently treated for "a lung infection". Patient denies any fever     Past Medical History  Diagnosis Date  . Lung cancer ([place])   . COPD (chronic obstructive pulmonary disease) ([place])   . Cancer ([place])   . Memory deficit   . Celiac disease   . A-fib [facility])     Patient Active Problem List   Diagnosis Date Noted  . Pneumonia 11/15/2015  . CAP (community acquired pneumonia) 11/12/2015  . Hereditary and idiopathic peripheral neuropathy 05/12/2015  . Neuropathy ([place]) 08/16/2014  . Dementia 08/16/2014  . Lung cancer ([place]) 08/16/2014  . Celiac disease 08/16/2014  . Atrial fibrillation ([place]) 08/16/2014    Past Surgical History  Procedure Laterality Date  . Lung cancer surgery    . Pacemaker placement    . Lung lobectomy Right   . Pacemaker insertion      Current Outpatient Rx  Name  Route  Sig  Dispense  Refill  . ALPRAZolam (XANAX) 0.25 MG tablet   Oral   Take 0.25 mg by mouth daily as needed for anxiety.          . cholecalciferol (VITAMIN D) 1000 UNITS tablet   Oral   Take 2,000 Units by mouth daily.          . clobetasol ointment (TEMOVATE) 0.05 %   Topical   Apply 1 application topically 3 (three) times a week. Uses Mon, Wed, Fri         . cyanocobalamin (,VITAMIN B-12,) 1000 MCG/ML injection   Intramuscular   Inject 1,000 mcg into the muscle every 30 (thirty) days. Injection once a month         . donepezil (ARICEPT) 10 MG tablet   Oral   Take 1 tablet (10 mg total) by mouth at bedtime.   90 tablet   3   . flecainide (TAMBOCOR) 50 MG tablet    Oral   Take 75 mg by mouth 2 (two) times daily.         . furosemide (LASIX) 20 MG tablet   Oral   Take 20 mg by mouth daily as needed for fluid or edema.          . gabapentin (NEURONTIN) 300 MG capsule      Take 4 capsules in the morning and 4 capsules in the evening Patient taking differently: Take 1,200 mg by mouth 2 (two) times daily.    720 capsule   2   . lactulose (CHRONULAC) 10 GM/15ML solution   Oral   Take 10 g by mouth daily as needed for mild constipation.         Marland Kitchen levalbuterol (XOPENEX HFA) 45 MCG/ACT inhaler   Inhalation   Inhale 2 puffs into the lungs every 4 (four) hours as needed for wheezing.         . metoprolol tartrate (LOPRESSOR) 25 MG tablet   Oral   Take 12.5 mg by mouth daily.          . nortriptyline (PAMELOR) 10 MG capsule      Take 1 capsule at bedtime for  1 week, then increase to 2 capsules at night Patient taking differently: Take 20 mg by mouth at bedtime.    180 capsule   3   . pantoprazole (PROTONIX) 40 MG tablet   Oral   Take 40 mg by mouth daily.          Marland Kitchen tiotropium (SPIRIVA) 18 MCG inhalation capsule   Inhalation   Place 18 mcg into inhaler and inhale daily.          Marland Kitchen amoxicillin-clavulanate (AUGMENTIN) 875-125 MG tablet   Oral   Take 1 tablet by mouth every 12 (twelve) hours.   14 tablet   0   . amoxicillin-clavulanate (AUGMENTIN) 875-125 MG tablet   Oral   Take 1 tablet by mouth 2 (two) times daily. For 7 days         . apixaban (ELIQUIS) 5 MG TABS tablet   Oral   Take 2 tablets (10 mg total) by mouth 2 (two) times daily.   10 tablet   0   . apixaban (ELIQUIS) 5 MG TABS tablet   Oral   Take 1 tablet (5 mg total) by mouth 2 (two) times daily.   60 tablet   0   . apixaban (ELIQUIS) 5 MG TABS tablet   Oral   Take 5 mg by mouth 2 (two) times daily.         Marland Kitchen docusate sodium (COLACE) 100 MG capsule   Oral   Take 100 mg by mouth 2 (two) times daily.         Marland Kitchen donepezil (ARICEPT) 10 MG  tablet   Oral   Take 10 mg by mouth at bedtime.         . flecainide (TAMBOCOR) 50 MG tablet   Oral   Take 1.5 mg by mouth 2 (two) times daily.         . furosemide (LASIX) 20 MG tablet   Oral   Take 1 tablet (20 mg total) by mouth daily.   30 tablet   0   . gabapentin (NEURONTIN) 300 MG capsule   Oral   Take 1,200 mg by mouth 2 (two) times daily.         Marland Kitchen levalbuterol (XOPENEX HFA) 45 MCG/ACT inhaler   Inhalation   Inhale 2 puffs into the lungs every 6 (six) hours as needed for wheezing.         . metoprolol succinate (TOPROL-XL) 25 MG 24 hr tablet   Oral   Take 12.5 mg by mouth daily.         . pantoprazole (PROTONIX) 40 MG tablet   Oral   Take 40 mg by mouth daily.         Marland Kitchen tiotropium (SPIRIVA) 18 MCG inhalation capsule   Inhalation   Place 18 mcg into inhaler and inhale daily.           Allergies Gluten meal; Gluten meal; and Parabens  Family History  Problem Relation Age of Onset  . Cancer Mother   . Cancer Sister     Social History Social History  Substance Use Topics  . Smoking status: Former Research scientist (life sciences)  . Smokeless tobacco: None  . Alcohol Use: Yes     Comment: social    Review of Systems  Constitutional: Negative for fever. Eyes: Negative for visual changes. ENT: Negative for sore throat. Cardiovascular: Negative for chest pain. Respiratory: positive for shortness of breath. Gastrointestinal: Negative for abdominal pain, vomiting and diarrhea. Genitourinary: Negative for dysuria.  Musculoskeletal: Negative for back pain. Skin: Negative for rash. Neurological: Negative for headaches, focal weakness or numbness.   10-point ROS otherwise negative.  ____________________________________________   PHYSICAL EXAM:  VITAL SIGNS: ED Triage Vitals  Enc Vitals Group     BP 11/11/15 2345 107/60 mmHg     Pulse Rate 11/11/15 2345 76     Resp 11/11/15 2345 20     Temp 11/11/15 2345 98.3 F (36.8 C)     Temp Source 11/11/15 2345  Oral     SpO2 11/11/15 2345 98 %     Weight 11/12/15 0552 124 lb 14.4 oz (56.654 kg)     Height 11/12/15 0552 '5\' 2"'$  (1.575 m)     Head Cir --      Peak Flow --      Pain Score 11/11/15 2345 0     Pain Loc --      Pain Edu? --      Excl. in [place]? --     Constitutional: Alert and oriented. Well appearing and in no distress. Eyes: Conjunctivae are normal. PERRL. Normal extraocular movements. ENT   Head: Normocephalic and atraumatic.   Nose: No congestion/rhinnorhea.   Mouth/Throat: Mucous membranes are moist.   Neck: No stridor. Hematological/Lymphatic/Immunilogical: No cervical lymphadenopathy. Cardiovascular: Normal rate, regular rhythm. Normal and symmetric distal pulses are present in all extremities. No murmurs, rubs, or gallops. Respiratory: Normal respiratory effort without tachypnea nor retractions. Breath sounds are clear and equal bilaterally bibasilar rhonchi Gastrointestinal: Soft and nontender. No distention. There is no CVA tenderness. Genitourinary: deferred Musculoskeletal: Nontender with normal range of motion in all extremities. No joint effusions.  No lower extremity tenderness nor edema. Neurologic:  Normal speech and language. No gross focal neurologic deficits are appreciated. Speech is normal.  Skin:  Skin is warm, dry and intact. No rash noted. Psychiatric: Mood and affect are normal. Speech and behavior are normal. Patient exhibits appropriate insight and judgment.  ____________________________________________    LABS (pertinent positives/negatives)  Labs Reviewed  COMPREHENSIVE METABOLIC PANEL - Abnormal; Notable for the following:    Glucose, Bld 132 (*)    AST 45 (*)    Total Bilirubin 1.4 (*)    All other components within normal limits  CBC WITH DIFFERENTIAL/PLATELET - Abnormal; Notable for the following:    RBC 3.40 (*)    MCV 107.5 (*)    MCH 35.5 (*)    RDW 15.8 (*)    Lymphs Abs 0.6 (*)    All other components within normal limits   CBC - Abnormal; Notable for the following:    RBC 3.00 (*)    Hemoglobin 10.7 (*)    HCT 32.1 (*)    MCV 107.1 (*)    MCH 35.8 (*)    RDW 15.4 (*)    Platelets 120 (*)    All other components within normal limits  BASIC METABOLIC PANEL - Abnormal; Notable for the following:    Sodium 132 (*)    Creatinine, Ser 1.01 (*)    Calcium 8.3 (*)    GFR calc non Af Amer 52 (*)    Anion gap 4 (*)    All other components within normal limits  CULTURE, BLOOD (ROUTINE X 2)  CULTURE, BLOOD (ROUTINE X 2)  MRSA PCR SCREENING  TSH  HEMOGLOBIN A1C     ____________________________________________   EKG  ED ECG REPORT I, BROWN, [place] N, the attending physician, personally viewed and interpreted this ECG.   Date: 11/20/2015  EKG  Time: 11:57 PM  Rate: 73  Rhythm: Normal sinus rhythm  Axis: None  Intervals: Normal  ST&T Change: None   ____________________________________________    RADIOLOGY  CT Angio Chest PE W/Cm &/Or Wo Cm (Final result) Result time: 11/12/15 02:56:09   Final result by Rad Results In Interface (11/12/15 02:56:09)   Narrative:   CLINICAL DATA: Shortness of breath. History of lung cancer. Recent lung infection.  EXAM: CT ANGIOGRAPHY CHEST WITH CONTRAST  TECHNIQUE: Multidetector CT imaging of the chest was performed using the standard protocol during bolus administration of intravenous contrast. Multiplanar CT image reconstructions and MIPs were obtained to evaluate the vascular anatomy.  CONTRAST: 67m OMNIPAQUE IOHEXOL 350 MG/ML SOLN  COMPARISON: None.  FINDINGS: THORACIC INLET/BODY WALL:  Dual-chamber pacer from the left with leads in unremarkable position. Dermal inclusion cyst in the right axilla.  Mild anasarca.  MEDIASTINUM:  Borderline cardiomegaly. No significant pericardial effusion. Intrahepatic reflux of right heart contrast suggesting elevated pressures. Essentially non-opacified aorta due to contrast timing. No evidence  of acute aortic syndrome.  Nonocclusive filling defect within subsegmental right upper lobe branch on series 9, image 93. When allowing for motion at the level of the anterior basilar segment vessels on the left, there is no convincing other pulmonary embolism. Given solitary small embolus, acute right heart strain is not considered.  LUNG WINDOWS:  There is nodular interstitial thickening and airspace opacity in the right basilar lung neighboring postoperative changes of lower lobectomy. It is unclear if this is pneumonia, radiation changes, or lymphangitic tumor. There is associated pleural thickening which could be treatment related scarring. Small layering left pleural effusion. Suspect early interstitial edema with septal thickening at the bases.  Emphysema, panlobular at the apices. There are patchy ground-glass opacities in the left upper lobe measuring 2 cm or less, chronicity indeterminate without comparison. Presumed surveillance in this patient with history of lung cancer.  UPPER ABDOMEN:  No acute finding. Negative adrenals.  OSSEOUS:  No acute fracture. No suspicious lytic or blastic lesions.  These results were called by telephone at the time of interpretation on 11/12/2015 at 2:49 am to Dr. RMarjean Donna, who verbally acknowledged these results.  Review of the MIP images confirms the above findings.  IMPRESSION: 1. Single subsegmental pulmonary embolism to the right upper lobe, recent appearing. 2. Airspace disease and pleural thickening at the right base neighboring lower lobectomy changes. Without CT comparison, pneumonia, radiation change, or lymphangitic tumor cannot be distinguished - but appearance on esophagram July 2016 favors pneumonia. Recommend follow-up with CT surgeon for radiographic correlation. 3. Small left pleural effusion and probable developing pulmonary edema. 4. Emphysema.   Electronically Signed By: JMonte Fantasia M.D. On: 11/12/2015 02:54          DG Chest 2 View (Final result) Result time: 11/12/15 00:26:55   Final result by Rad Results In Interface (11/12/15 00:26:55)   Narrative:   CLINICAL DATA: Shortness of breath for 1 day. History of lung cancer, pneumonia, COPD.  EXAM: CHEST 2 VIEW  COMPARISON: None available for comparison at time of study interpretation.  FINDINGS: Moderate RIGHT pleural effusion with underlying consolidation and volume loss/atelectasis. Cardiomediastinal silhouette is slightly shifted to the RIGHT, overall normal in size. Mildly calcified aortic knob. Dual lead LEFT cardiac pacemaker with lead projecting RIGHT atrium and RIGHT ventricle. RIGHT apical pleural thickening. No pneumothorax. Crescentic calcification inferior to the LEFT glenohumeral joint could represent loose body or degenerative change. Upper lumbar levoscoliosis.  IMPRESSION: Moderate RIGHT pleural effusion  with underlying consolidation. LEFT lung base atelectasis/ scarring. Recommend follow-up chest radiograph after treatment to exclude underlying mass.   Electronically Signed By: Elon Alas M.D. On: 11/12/2015 00:26   _   INITIAL IMPRESSION / ASSESSMENT AND PLAN / ED COURSE  Pertinent labs & imaging results that were available during my care of the patient were reviewed by me and considered in my medical decision making (see chart for details).  Patient received Levaquin 500 mg IV in the emergency department. Patient discussed with Dr. Marcille Blanco hospitalist admission for further evaluation and management   ____________________________________________   FINAL CLINICAL IMPRESSION(S) / ED DIAGNOSES  Final diagnoses:  Pleural effusion  Community acquired pneumonia  Other acute pulmonary embolism without acute cor pulmonale ([place])      Gregor Hams, MD 11/20/15 [phone]

## 2015-11-23 DIAGNOSIS — J189 Pneumonia, unspecified organism: Secondary | ICD-10-CM

## 2015-11-23 HISTORY — DX: Pneumonia, unspecified organism: J18.9

## 2016-01-06 DIAGNOSIS — I5033 Acute on chronic diastolic (congestive) heart failure: Secondary | ICD-10-CM | POA: Insufficient documentation

## 2016-01-27 ENCOUNTER — Encounter: Payer: Medicare Other | Attending: Specialist | Admitting: Respiratory Therapy

## 2016-01-27 ENCOUNTER — Encounter: Payer: Self-pay | Admitting: Respiratory Therapy

## 2016-01-27 DIAGNOSIS — J449 Chronic obstructive pulmonary disease, unspecified: Secondary | ICD-10-CM | POA: Diagnosis present

## 2016-01-27 NOTE — Progress Notes (Signed)
Pulmonary Individual Treatment Plan  Patient Details  Name: Cheryl Cannon MRN: [id number] Date of Birth: 10/27/1936 Referring Provider:  Erby Pian, MD  Initial Encounter Date: Date: 01/27/16  Visit Diagnosis: COPD, moderate ([place]) - Plan: PULMONARY REHAB 30 DAY REVIEW  Patient's Home Medications on Admission:  Current outpatient prescriptions:    cyanocobalamin (,VITAMIN B-12,) 1000 MCG/ML injection, Inject into the muscle., Disp: , Rfl:    flecainide (TAMBOCOR) 50 MG tablet, Take by mouth., Disp: , Rfl:    furosemide (LASIX) 20 MG tablet, Take by mouth., Disp: , Rfl:    gabapentin (NEURONTIN) 300 MG capsule, Take 1,200 mg by mouth., Disp: , Rfl:    ALPRAZolam (XANAX) 0.25 MG tablet, Take 0.25 mg by mouth daily as needed for anxiety. , Disp: , Rfl:    amoxicillin-clavulanate (AUGMENTIN) 875-125 MG tablet, Take 1 tablet by mouth every 12 (twelve) hours., Disp: 14 tablet, Rfl: 0   amoxicillin-clavulanate (AUGMENTIN) 875-125 MG tablet, Take 1 tablet by mouth 2 (two) times daily. For 7 days, Disp: , Rfl:    apixaban (ELIQUIS) 5 MG TABS tablet, Take 2 tablets (10 mg total) by mouth 2 (two) times daily., Disp: 10 tablet, Rfl: 0   apixaban (ELIQUIS) 5 MG TABS tablet, Take 1 tablet (5 mg total) by mouth 2 (two) times daily., Disp: 60 tablet, Rfl: 0   apixaban (ELIQUIS) 5 MG TABS tablet, Take 5 mg by mouth., Disp: , Rfl:    apixaban (ELIQUIS) 5 MG TABS tablet, Take 5 mg by mouth 2 (two) times daily., Disp: , Rfl:    cholecalciferol (VITAMIN D) 1000 UNITS tablet, Take 2,000 Units by mouth daily. , Disp: , Rfl:    clobetasol ointment (TEMOVATE) 3.81 %, Apply 1 application topically 3 (three) times a week. Uses Mon, Wed, Fri, Disp: , Rfl:    cyanocobalamin (,VITAMIN B-12,) 1000 MCG/ML injection, Inject 1,000 mcg into the muscle every 30 (thirty) days. Injection once a month, Disp: , Rfl:    docusate sodium (COLACE) 100 MG capsule, Take 100 mg by mouth 2 (two) times daily., Disp: ,  Rfl:    donepezil (ARICEPT) 10 MG tablet, Take 1 tablet (10 mg total) by mouth at bedtime., Disp: 90 tablet, Rfl: 3   donepezil (ARICEPT) 10 MG tablet, Take 10 mg by mouth at bedtime., Disp: , Rfl:    flecainide (TAMBOCOR) 50 MG tablet, Take 75 mg by mouth 2 (two) times daily., Disp: , Rfl:    flecainide (TAMBOCOR) 50 MG tablet, Take 1.5 mg by mouth 2 (two) times daily., Disp: , Rfl:    furosemide (LASIX) 20 MG tablet, Take 20 mg by mouth daily as needed for fluid or edema. , Disp: , Rfl:    furosemide (LASIX) 20 MG tablet, Take 1 tablet (20 mg total) by mouth daily., Disp: 30 tablet, Rfl: 0   gabapentin (NEURONTIN) 300 MG capsule, Take 4 capsules in the morning and 4 capsules in the evening (Patient taking differently: Take 1,200 mg by mouth 2 (two) times daily. ), Disp: 720 capsule, Rfl: 2   gabapentin (NEURONTIN) 300 MG capsule, Take 1,200 mg by mouth 2 (two) times daily., Disp: , Rfl:    lactulose (CHRONULAC) 10 GM/15ML solution, Take 10 g by mouth daily as needed for mild constipation., Disp: , Rfl:    levalbuterol (XOPENEX HFA) 45 MCG/ACT inhaler, Inhale 2 puffs into the lungs every 4 (four) hours as needed for wheezing., Disp: , Rfl:    levalbuterol (XOPENEX HFA) 45 MCG/ACT inhaler, Inhale 2  puffs into the lungs every 6 (six) hours as needed for wheezing., Disp: , Rfl:    metoprolol succinate (TOPROL-XL) 25 MG 24 hr tablet, Take 12.5 mg by mouth daily., Disp: , Rfl:    metoprolol tartrate (LOPRESSOR) 25 MG tablet, Take 12.5 mg by mouth daily. , Disp: , Rfl:    metoprolol tartrate (LOPRESSOR) 25 MG tablet, Take 12.5 mg by mouth., Disp: , Rfl:    nortriptyline (PAMELOR) 10 MG capsule, Take 1 capsule at bedtime for 1 week, then increase to 2 capsules at night (Patient taking differently: Take 20 mg by mouth at bedtime. ), Disp: 180 capsule, Rfl: 3   pantoprazole (PROTONIX) 40 MG tablet, Take 40 mg by mouth daily. , Disp: , Rfl:    pantoprazole (PROTONIX) 40 MG tablet, Take 40 mg  by mouth daily., Disp: , Rfl:    tiotropium (SPIRIVA) 18 MCG inhalation capsule, Place 18 mcg into inhaler and inhale daily. , Disp: , Rfl:    tiotropium (SPIRIVA) 18 MCG inhalation capsule, Place 18 mcg into inhaler and inhale daily., Disp: , Rfl:   Past Medical History: Past Medical History  Diagnosis Date   Lung cancer ([place])    COPD (chronic obstructive pulmonary disease) ([place])    Cancer ([place])    Memory deficit    Celiac disease    A-fib ([place])     Tobacco Use: History  Smoking status   Former Smoker -- 1.00 packs/day for 72 years   Quit date: 11/22/1998  Smokeless tobacco   Not on file    Labs: Recent Review Flowsheet Data    Labs for ITP Cardiac and Pulmonary Rehab Latest Ref Rng 11/11/2015   Hemoglobin A1c 4.0 - 6.0 % 5.3       ADL UCSD:     ADL UCSD      01/27/16 0900       ADL UCSD   ADL Phase Entry     SOB Score total 10     Rest 0     Walk 0     Stairs 3     Bath 0     Dress 0     Shop 0         Pulmonary Function Assessment:     Pulmonary Function Assessment - 01/27/16 0900    Pulmonary Function Tests   RV% 88 %   DLCO% 58 %   Initial Spirometry Results   FVC% 92 %  Test date2/11/2014   FEV1% 74 %   FEV1/FVC Ratio 61   Post Bronchodilator Spirometry Results   FVC% 92 %   FEV1% 74 %   FEV1/FVC Ratio 61   Breath   Bilateral Breath Sounds Clear;Decreased  Decreased on right - lobectemy mid and lower sections   Shortness of Breath No      Exercise Target Goals: Date: 01/27/16  Exercise Program Goal: Individual exercise prescription set with THRR, safety & activity barriers. Participant demonstrates ability to understand and report RPE using BORG scale, to self-measure pulse accurately, and to acknowledge the importance of the exercise prescription.  Exercise Prescription Goal: Starting with aerobic activity 30 plus minutes a day, 3 days per week for initial exercise prescription. Provide home exercise prescription and  guidelines that participant acknowledges understanding prior to discharge.  Activity Barriers & Risk Stratification:     Activity Barriers & Cardiac Risk Stratification - 01/27/16 0900    Activity Barriers & Cardiac Risk Stratification   Activity Barriers Deconditioning   Cardiac Risk Stratification  Low      6 Minute Walk:     6 Minute Walk      01/27/16 1254       6 Minute Walk   Phase Initial     Distance 855 feet     Walk Time 6 minutes     # of Rest Breaks 0     RPE 13     Perceived Dyspnea  3     Symptoms No     Resting HR 71 bpm     Resting BP 106/64 mmHg     Max Ex. HR 91 bpm     Max Ex. BP 124/72 mmHg        Initial Exercise Prescription:     Initial Exercise Prescription - 01/27/16 1200    Date of Initial Exercise Prescription   Date 01/27/16   Treadmill   MPH 1.5   Grade 0   Minutes 10   Recumbant Bike   Level 2   RPM 40   Watts 20   Minutes 10   NuStep   Level 2   Watts 40   Minutes 10   Arm Ergometer   Level 1   Watts 10   Minutes 10   Recumbant Elliptical   Level 1   RPM 40   Watts 20   Minutes 10   REL-XR   Level 2   Watts 40   Minutes 10   T5 Nustep   Level 1   Watts 15   Minutes 10   Biostep-RELP   Level 2   Watts 40   Minutes 10   Prescription Details   Frequency (times per week) 3   Duration Progress to 30 minutes of continuous aerobic without signs/symptoms of physical distress   Intensity   THRR REST +  30   Ratings of Perceived Exertion 11-15   Perceived Dyspnea 0-4   Progression   Progression Continue progressive overload as per policy without signs/symptoms or physical distress.   Resistance Training   Training Prescription Yes   Weight 1   Reps 10-15      Exercise Prescription Changes:   Discharge Exercise Prescription (Final Exercise Prescription Changes):    Nutrition:  Target Goals: Understanding of nutrition guidelines, daily intake of sodium '1500mg'$ , cholesterol '200mg'$ , calories 30% from fat  and 7% or less from saturated fats, daily to have 5 or more servings of fruits and vegetables.  Biometrics:    Nutrition Therapy Plan and Nutrition Goals:     Nutrition Therapy & Goals - 01/27/16 0900    Nutrition Therapy   Diet Cheryl Cannon prefers not to meet the dietian; she cooks healthy and eats small portion sizes      Nutrition Discharge: Rate Your Plate Scores:   Psychosocial: Target Goals: Acknowledge presence or absence of depression, maximize coping skills, provide positive support system. Participant is able to verbalize types and ability to use techniques and skills needed for reducing stress and depression.  Initial Review & Psychosocial Screening:     Initial Psych Review & Screening - 01/27/16 0900    Family Dynamics   Good Support System? Yes   Comments Cheryl Cannon has good support from her husband and states she has no depression. She is disappointed with the long recovery from her pneumonia, but is looking forward to monitored exercise in [facility] and learning more aboput COPD.   Barriers   Psychosocial barriers to participate in program There are no identifiable barriers or psychosocial  needs.;The patient should benefit from training in stress management and relaxation.   Screening Interventions   Interventions Encouraged to exercise      Quality of Life Scores:     Quality of Life - 01/27/16 0900    Quality of Life Scores   Health/Function Pre 29.25 %   Socioeconomic Pre 30 %   Psych/Spiritual Pre 30 %   Family Pre 30 %   GLOBAL Pre 29.66 %      PHQ-9:     Recent Review Flowsheet Data    Depression screen [facility] 2/9 01/27/2016   Decreased Interest 0   Down, Depressed, Hopeless 0   PHQ - 2 Score 0   Altered sleeping 0   Tired, decreased energy 0   Change in appetite 0   Feeling bad or failure about yourself  0   Trouble concentrating 0   Suicidal thoughts 0   PHQ-9 Score 0   Difficult doing work/chores Not difficult at all       Psychosocial Evaluation and Intervention:   Psychosocial Re-Evaluation:  Education: Education Goals: Education classes will be provided on a weekly basis, covering required topics. Participant will state understanding/return demonstration of topics presented.  Learning Barriers/Preferences:     Learning Barriers/Preferences - 01/27/16 0900    Learning Barriers/Preferences   Learning Barriers None   Learning Preferences Group Instruction;Individual Instruction;Pictoral;Skilled Demonstration;Verbal Instruction;Video;Written Material      Education Topics: Initial Evaluation Education: - Verbal, written and demonstration of respiratory meds, RPE/PD scales, oximetry and breathing techniques. Instruction on use of nebulizers and MDIs: cleaning and proper use, rinsing mouth with steroid doses and importance of monitoring MDI activations.          Pulmonary Rehab from 01/27/2016 in [facility] Cardiac and Pulmonary Rehab   Date  01/27/16   Educator  LB   Instruction Review Code  2- meets goals/outcomes      General Nutrition Guidelines/Fats and Fiber: -Group instruction provided by verbal, written material, models and posters to present the general guidelines for heart healthy nutrition. Gives an explanation and review of dietary fats and fiber.   Controlling Sodium/Reading Food Labels: -Group verbal and written material supporting the discussion of sodium use in heart healthy nutrition. Review and explanation with models, verbal and written materials for utilization of the food label.   Exercise Physiology & Risk Factors: - Group verbal and written instruction with models to review the exercise physiology of the cardiovascular system and associated critical values. Details cardiovascular disease risk factors and the goals associated with each risk factor.   Aerobic Exercise & Resistance Training: - Gives group verbal and written discussion on the health impact of inactivity. On the  components of aerobic and resistive training programs and the benefits of this training and how to safely progress through these programs.   Flexibility, Balance, General Exercise Guidelines: - Provides group verbal and written instruction on the benefits of flexibility and balance training programs. Provides general exercise guidelines with specific guidelines to those with heart or lung disease. Demonstration and skill practice provided.   Stress Management: - Provides group verbal and written instruction about the health risks of elevated stress, cause of high stress, and healthy ways to reduce stress.   Depression: - Provides group verbal and written instruction on the correlation between heart/lung disease and depressed mood, treatment options, and the stigmas associated with seeking treatment.   Exercise & Equipment Safety: - Individual verbal instruction and demonstration of equipment use and safety with use of  the equipment.   Infection Prevention: - Provides verbal and written material to individual with discussion of infection control including proper hand washing and proper equipment cleaning during exercise session.   Falls Prevention: - Provides verbal and written material to individual with discussion of falls prevention and safety.      Pulmonary Rehab from 01/27/2016 in [facility] Cardiac and Pulmonary Rehab   Date  01/27/16   Educator  LB   Instruction Review Code  2- meets goals/outcomes      Diabetes: - Individual verbal and written instruction to review signs/symptoms of diabetes, desired ranges of glucose level fasting, after meals and with exercise. Advice that pre and post exercise glucose checks will be done for 3 sessions at entry of program.   Chronic Lung Diseases: - Group verbal and written instruction to review new updates, new respiratory medications, new advancements in procedures and treatments. Provide informative websites and "800" numbers of  self-education.   Lung Procedures: - Group verbal and written instruction to describe testing methods done to diagnose lung disease. Review the outcome of test results. Describe the treatment choices: Pulmonary Function Tests, ABGs and oximetry.   Energy Conservation: - Provide group verbal and written instruction for methods to conserve energy, plan and organize activities. Instruct on pacing techniques, use of adaptive equipment and posture/positioning to relieve shortness of breath.   Triggers: - Group verbal and written instruction to review types of environmental controls: home humidity, furnaces, filters, dust mite/pet prevention, HEPA vacuums. To discuss weather changes, air quality and the benefits of nasal washing.   Exacerbations: - Group verbal and written instruction to provide: warning signs, infection symptoms, calling MD promptly, preventive modes, and value of vaccinations. Review: effective airway clearance, coughing and/or vibration techniques. Create an Sports administrator.   Oxygen: - Individual and group verbal and written instruction on oxygen therapy. Includes supplement oxygen, available portable oxygen systems, continuous and intermittent flow rates, oxygen safety, concentrators, and Medicare reimbursement for oxygen.   Respiratory Medications: - Group verbal and written instruction to review medications for lung disease. Drug class, frequency, complications, importance of spacers, rinsing mouth after steroid MDI's, and proper cleaning methods for nebulizers.      Pulmonary Rehab from 01/27/2016 in [facility] Cardiac and Pulmonary Rehab   Date  01/27/16   Educator  LB   Instruction Review Code  2- meets goals/outcomes      AED/CPR: - Group verbal and written instruction with the use of models to demonstrate the basic use of the AED with the basic ABC's of resuscitation.   Breathing Retraining: - Provides individuals verbal and written instruction on purpose, frequency,  and proper technique of diaphragmatic breathing and pursed-lipped breathing. Applies individual practice skills.   Anatomy and Physiology of the Lungs: - Group verbal and written instruction with the use of models to provide basic lung anatomy and physiology related to function, structure and complications of lung disease.   Heart Failure: - Group verbal and written instruction on the basics of heart failure: signs/symptoms, treatments, explanation of ejection fraction, enlarged heart and cardiomyopathy.   Sleep Apnea: - Individual verbal and written instruction to review Obstructive Sleep Apnea. Review of risk factors, methods for diagnosing and types of masks and machines for OSA.   Anxiety: - Provides group, verbal and written instruction on the correlation between heart/lung disease and anxiety, treatment options, and management of anxiety.   Relaxation: - Provides group, verbal and written instruction about the benefits of relaxation for patients with heart/lung disease.  Also provides patients with examples of relaxation techniques.   Knowledge Questionnaire Score:     Knowledge Questionnaire Score - 01/27/16 0900    Knowledge Questionnaire Score   Pre Score 6/10      Personal Goals and Risk Factors at Admission:     Personal Goals and Risk Factors at Admission - 01/27/16 0900    Core Components/Risk Factors/Patient Goals on Admission   Sedentary Yes   Intervention Provide advice, education, support and counseling about physical activity/exercise needs.;Develop an individualized exercise prescription for aerobic and resistive training based on initial evaluation findings, risk stratification, comorbidities and participant's personal goals.  Cheryl Cannon had pneumonia in December and has a long recovery period. She previously exercised at the [facility] and is looking forward to return to her exercise at their facility.   Expected Outcomes Achievement of  increased cardiorespiratory fitness and enhanced flexibility, muscular endurance and strength shown through measurements of functional capaciy and personal statement of participant.   Improve shortness of breath with ADL's Yes   Intervention Provide education, individualized exercise plan and daily activity instruction to help decrease symptoms of SOB with activities of daily living.  Cheryl Cannon rates stairs and hills a "3" on shortness of breath questionnaire.   Expected Outcomes Short Term: Achieves a reduction of symptoms when performing activities of daily living.   Develop more efficient breathing techniques such as purse lipped breathing and diaphragmatic breathing; and practicing self-pacing with activity Yes   Intervention Provide education, demonstration and support about specific breathing techniuqes utilized for more efficient breathing. Include techniques such as pursed lipped breathing, diaphragmatic breathing and self-pacing activity.   Expected Outcomes Short Term: Participant will be able to demonstrate and use breathing techniques as needed throughout daily activities.   Increase knowledge of respiratory medications and ability to use respiratory devices properly  Yes   Intervention Provide education and demonstration as needed of appropriate use of medications, inhalers, and oxygen therapy.  Cheryl Cannon takes Spiriva and wears 2l/m [place] at night with sleep. She is being tested this week to see if she still needs the oxygen at night.   Expected Outcomes Short Term: Achieves understanding of medications use. Understands that oxygen is a medication prescribed by physician. Demonstrates appropriate use of inhaler and oxygen therapy.      Personal Goals and Risk Factors Review:    Personal Goals Discharge (Final Personal Goals and Risk Factors Review):    ITP Comments:   Comments: Cheryl Cannon plans to start [place] on 01/30/2016 and will attend 3 days/week.

## 2016-01-27 NOTE — Progress Notes (Signed)
Pulmonary Individual Treatment Plan  Patient Details  Name: Cheryl Cannon MRN: [id number] Date of Birth: [birth year] Referring Provider:  Erby Pian, MD  Initial Encounter Date: Date: 01/27/16  Visit Diagnosis: COPD, moderate ([place])  Patient's Home Medications on Admission:  Current outpatient prescriptions:  .  ALPRAZolam (XANAX) 0.25 MG tablet, Take 0.25 mg by mouth daily as needed for anxiety. , Disp: , Rfl:  .  amoxicillin-clavulanate (AUGMENTIN) 875-125 MG tablet, Take 1 tablet by mouth every 12 (twelve) hours., Disp: 14 tablet, Rfl: 0 .  amoxicillin-clavulanate (AUGMENTIN) 875-125 MG tablet, Take 1 tablet by mouth 2 (two) times daily. For 7 days, Disp: , Rfl:  .  apixaban (ELIQUIS) 5 MG TABS tablet, Take 2 tablets (10 mg total) by mouth 2 (two) times daily., Disp: 10 tablet, Rfl: 0 .  apixaban (ELIQUIS) 5 MG TABS tablet, Take 1 tablet (5 mg total) by mouth 2 (two) times daily., Disp: 60 tablet, Rfl: 0 .  apixaban (ELIQUIS) 5 MG TABS tablet, Take 5 mg by mouth 2 (two) times daily., Disp: , Rfl:  .  cholecalciferol (VITAMIN D) 1000 UNITS tablet, Take 2,000 Units by mouth daily. , Disp: , Rfl:  .  clobetasol ointment (TEMOVATE) 7.42 %, Apply 1 application topically 3 (three) times a week. Uses Mon, Wed, Fri, Disp: , Rfl:  .  cyanocobalamin (,VITAMIN B-12,) 1000 MCG/ML injection, Inject 1,000 mcg into the muscle every 30 (thirty) days. Injection once a month, Disp: , Rfl:  .  docusate sodium (COLACE) 100 MG capsule, Take 100 mg by mouth 2 (two) times daily., Disp: , Rfl:  .  donepezil (ARICEPT) 10 MG tablet, Take 1 tablet (10 mg total) by mouth at bedtime., Disp: 90 tablet, Rfl: 3 .  donepezil (ARICEPT) 10 MG tablet, Take 10 mg by mouth at bedtime., Disp: , Rfl:  .  flecainide (TAMBOCOR) 50 MG tablet, Take 75 mg by mouth 2 (two) times daily., Disp: , Rfl:  .  flecainide (TAMBOCOR) 50 MG tablet, Take 1.5 mg by mouth 2 (two) times daily., Disp: , Rfl:  .  furosemide (LASIX) 20 MG  tablet, Take 20 mg by mouth daily as needed for fluid or edema. , Disp: , Rfl:  .  furosemide (LASIX) 20 MG tablet, Take 1 tablet (20 mg total) by mouth daily., Disp: 30 tablet, Rfl: 0 .  gabapentin (NEURONTIN) 300 MG capsule, Take 4 capsules in the morning and 4 capsules in the evening (Patient taking differently: Take 1,200 mg by mouth 2 (two) times daily. ), Disp: 720 capsule, Rfl: 2 .  gabapentin (NEURONTIN) 300 MG capsule, Take 1,200 mg by mouth 2 (two) times daily., Disp: , Rfl:  .  lactulose (CHRONULAC) 10 GM/15ML solution, Take 10 g by mouth daily as needed for mild constipation., Disp: , Rfl:  .  levalbuterol (XOPENEX HFA) 45 MCG/ACT inhaler, Inhale 2 puffs into the lungs every 4 (four) hours as needed for wheezing., Disp: , Rfl:  .  levalbuterol (XOPENEX HFA) 45 MCG/ACT inhaler, Inhale 2 puffs into the lungs every 6 (six) hours as needed for wheezing., Disp: , Rfl:  .  metoprolol succinate (TOPROL-XL) 25 MG 24 hr tablet, Take 12.5 mg by mouth daily., Disp: , Rfl:  .  metoprolol tartrate (LOPRESSOR) 25 MG tablet, Take 12.5 mg by mouth daily. , Disp: , Rfl:  .  nortriptyline (PAMELOR) 10 MG capsule, Take 1 capsule at bedtime for 1 week, then increase to 2 capsules at night (Patient taking differently: Take 20 mg by  mouth at bedtime. ), Disp: 180 capsule, Rfl: 3 .  pantoprazole (PROTONIX) 40 MG tablet, Take 40 mg by mouth daily. , Disp: , Rfl:  .  pantoprazole (PROTONIX) 40 MG tablet, Take 40 mg by mouth daily., Disp: , Rfl:  .  tiotropium (SPIRIVA) 18 MCG inhalation capsule, Place 18 mcg into inhaler and inhale daily. , Disp: , Rfl:  .  tiotropium (SPIRIVA) 18 MCG inhalation capsule, Place 18 mcg into inhaler and inhale daily., Disp: , Rfl:   Past Medical History: Past Medical History  Diagnosis Date  . Lung cancer ([place])   . COPD (chronic obstructive pulmonary disease) ([place])   . Cancer ([place])   . Memory deficit   . Celiac disease   . A-fib ([place])     Tobacco Use: History  Smoking  status  . Former Smoker -- 1.00 packs/day for 44 years  . Quit date: 11/22/1998  Smokeless tobacco  . Not on file    Labs: Recent Review Flowsheet Data    Labs for ITP Cardiac and Pulmonary Rehab Latest Ref Rng 11/11/2015   Hemoglobin A1c 4.0 - 6.0 % 5.3       ADL UCSD:     ADL UCSD      01/27/16 0900       ADL UCSD   ADL Phase Entry     SOB Score total 10     Rest 0     Walk 0     Stairs 3     Bath 0     Dress 0     Shop 0         Pulmonary Function Assessment:     Pulmonary Function Assessment - 01/27/16 0900    Pulmonary Function Tests   RV% 88 %   DLCO% 58 %   Initial Spirometry Results   FVC% 92 %  Test date2/11/2014   FEV1% 74 %   FEV1/FVC Ratio 61   Post Bronchodilator Spirometry Results   FVC% 92 %   FEV1% 74 %   FEV1/FVC Ratio 61   Breath   Bilateral Breath Sounds Clear;Decreased  Decreased on right - lobectemy mid and lower sections   Shortness of Breath No      Exercise Target Goals: Date: 01/27/16  Exercise Program Goal: Individual exercise prescription set with THRR, safety & activity barriers. Participant demonstrates ability to understand and report RPE using BORG scale, to self-measure pulse accurately, and to acknowledge the importance of the exercise prescription.  Exercise Prescription Goal: Starting with aerobic activity 30 plus minutes a day, 3 days per week for initial exercise prescription. Provide home exercise prescription and guidelines that participant acknowledges understanding prior to discharge.  Activity Barriers & Risk Stratification:     Activity Barriers & Cardiac Risk Stratification - 01/27/16 0900    Activity Barriers & Cardiac Risk Stratification   Activity Barriers Deconditioning   Cardiac Risk Stratification Low      6 Minute Walk:     6 Minute Walk      01/27/16 1254       6 Minute Walk   Phase Initial     Distance 855 feet     Walk Time 6 minutes     # of Rest Breaks 0     RPE 13      Perceived Dyspnea  3     Symptoms No     Resting HR 71 bpm     Resting BP 106/64 mmHg     Max  Ex. HR 91 bpm     Max Ex. BP 124/72 mmHg        Initial Exercise Prescription:     Initial Exercise Prescription - 01/27/16 1200    Date of Initial Exercise Prescription   Date 01/27/16   Treadmill   MPH 1.5   Grade 0   Minutes 10   Recumbant Bike   Level 2   RPM 40   Watts 20   Minutes 10   NuStep   Level 2   Watts 40   Minutes 10   Arm Ergometer   Level 1   Watts 10   Minutes 10   Recumbant Elliptical   Level 1   RPM 40   Watts 20   Minutes 10   REL-XR   Level 2   Watts 40   Minutes 10   T5 Nustep   Level 1   Watts 15   Minutes 10   Biostep-RELP   Level 2   Watts 40   Minutes 10   Prescription Details   Frequency (times per week) 3   Duration Progress to 30 minutes of continuous aerobic without signs/symptoms of physical distress   Intensity   THRR REST +  30   Ratings of Perceived Exertion 11-15   Perceived Dyspnea 0-4   Progression   Progression Continue progressive overload as per policy without signs/symptoms or physical distress.   Resistance Training   Training Prescription Yes   Weight 1   Reps 10-15      Exercise Prescription Changes:   Discharge Exercise Prescription (Final Exercise Prescription Changes):    Nutrition:  Target Goals: Understanding of nutrition guidelines, daily intake of sodium '1500mg'$ , cholesterol '200mg'$ , calories 30% from fat and 7% or less from saturated fats, daily to have 5 or more servings of fruits and vegetables.  Biometrics:    Nutrition Therapy Plan and Nutrition Goals:     Nutrition Therapy & Goals - 01/27/16 0900    Nutrition Therapy   Diet Ms Dillard prefers not to meet the dietian; she cooks healthy and eats small portion sizes      Nutrition Discharge: Rate Your Plate Scores:   Psychosocial: Target Goals: Acknowledge presence or absence of depression, maximize coping skills, provide positive  support system. Participant is able to verbalize types and ability to use techniques and skills needed for reducing stress and depression.  Initial Review & Psychosocial Screening:     Initial Psych Review & Screening - 01/27/16 0900    Family Dynamics   Good Support System? Yes   Comments Ms Jutte has good support from her husband and states she has no depression. She is disappointed with the long recovery from her pneumonia, but is looking forward to monitored exercise in [facility] and learning more aboput COPD.   Barriers   Psychosocial barriers to participate in program There are no identifiable barriers or psychosocial needs.;The patient should benefit from training in stress management and relaxation.   Screening Interventions   Interventions Encouraged to exercise      Quality of Life Scores:     Quality of Life - 01/27/16 0900    Quality of Life Scores   Health/Function Pre 29.25 %   Socioeconomic Pre 30 %   Psych/Spiritual Pre 30 %   Family Pre 30 %   GLOBAL Pre 29.66 %      PHQ-9:     Recent Review Flowsheet Data    Depression screen [facility] 2/9 01/27/2016  Decreased Interest 0   Down, Depressed, Hopeless 0   PHQ - 2 Score 0   Altered sleeping 0   Tired, decreased energy 0   Change in appetite 0   Feeling bad or failure about yourself  0   Trouble concentrating 0   Suicidal thoughts 0   PHQ-9 Score 0   Difficult doing work/chores Not difficult at all      Psychosocial Evaluation and Intervention:   Psychosocial Re-Evaluation:  Education: Education Goals: Education classes will be provided on a weekly basis, covering required topics. Participant will state understanding/return demonstration of topics presented.  Learning Barriers/Preferences:     Learning Barriers/Preferences - 01/27/16 0900    Learning Barriers/Preferences   Learning Barriers None   Learning Preferences Group Instruction;Individual Instruction;Pictoral;Skilled Demonstration;Verbal  Instruction;Video;Written Material      Education Topics: Initial Evaluation Education: - Verbal, written and demonstration of respiratory meds, RPE/PD scales, oximetry and breathing techniques. Instruction on use of nebulizers and MDIs: cleaning and proper use, rinsing mouth with steroid doses and importance of monitoring MDI activations.          Pulmonary Rehab from 01/27/2016 in [facility] Cardiac and Pulmonary Rehab   Date  01/27/16   Educator  LB   Instruction Review Code  2- meets goals/outcomes      General Nutrition Guidelines/Fats and Fiber: -Group instruction provided by verbal, written material, models and posters to present the general guidelines for heart healthy nutrition. Gives an explanation and review of dietary fats and fiber.   Controlling Sodium/Reading Food Labels: -Group verbal and written material supporting the discussion of sodium use in heart healthy nutrition. Review and explanation with models, verbal and written materials for utilization of the food label.   Exercise Physiology & Risk Factors: - Group verbal and written instruction with models to review the exercise physiology of the cardiovascular system and associated critical values. Details cardiovascular disease risk factors and the goals associated with each risk factor.   Aerobic Exercise & Resistance Training: - Gives group verbal and written discussion on the health impact of inactivity. On the components of aerobic and resistive training programs and the benefits of this training and how to safely progress through these programs.   Flexibility, Balance, General Exercise Guidelines: - Provides group verbal and written instruction on the benefits of flexibility and balance training programs. Provides general exercise guidelines with specific guidelines to those with heart or lung disease. Demonstration and skill practice provided.   Stress Management: - Provides group verbal and written instruction  about the health risks of elevated stress, cause of high stress, and healthy ways to reduce stress.   Depression: - Provides group verbal and written instruction on the correlation between heart/lung disease and depressed mood, treatment options, and the stigmas associated with seeking treatment.   Exercise & Equipment Safety: - Individual verbal instruction and demonstration of equipment use and safety with use of the equipment.   Infection Prevention: - Provides verbal and written material to individual with discussion of infection control including proper hand washing and proper equipment cleaning during exercise session.   Falls Prevention: - Provides verbal and written material to individual with discussion of falls prevention and safety.      Pulmonary Rehab from 01/27/2016 in [facility] Cardiac and Pulmonary Rehab   Date  01/27/16   Educator  LB   Instruction Review Code  2- meets goals/outcomes      Diabetes: - Individual verbal and written instruction to review signs/symptoms of diabetes, desired  ranges of glucose level fasting, after meals and with exercise. Advice that pre and post exercise glucose checks will be done for 3 sessions at entry of program.   Chronic Lung Diseases: - Group verbal and written instruction to review new updates, new respiratory medications, new advancements in procedures and treatments. Provide informative websites and "800" numbers of self-education.   Lung Procedures: - Group verbal and written instruction to describe testing methods done to diagnose lung disease. Review the outcome of test results. Describe the treatment choices: Pulmonary Function Tests, ABGs and oximetry.   Energy Conservation: - Provide group verbal and written instruction for methods to conserve energy, plan and organize activities. Instruct on pacing techniques, use of adaptive equipment and posture/positioning to relieve shortness of breath.   Triggers: - Group verbal  and written instruction to review types of environmental controls: home humidity, furnaces, filters, dust mite/pet prevention, HEPA vacuums. To discuss weather changes, air quality and the benefits of nasal washing.   Exacerbations: - Group verbal and written instruction to provide: warning signs, infection symptoms, calling MD promptly, preventive modes, and value of vaccinations. Review: effective airway clearance, coughing and/or vibration techniques. Create an Sports administrator.   Oxygen: - Individual and group verbal and written instruction on oxygen therapy. Includes supplement oxygen, available portable oxygen systems, continuous and intermittent flow rates, oxygen safety, concentrators, and Medicare reimbursement for oxygen.   Respiratory Medications: - Group verbal and written instruction to review medications for lung disease. Drug class, frequency, complications, importance of spacers, rinsing mouth after steroid MDI's, and proper cleaning methods for nebulizers.      Pulmonary Rehab from 01/27/2016 in [facility] Cardiac and Pulmonary Rehab   Date  01/27/16   Educator  LB   Instruction Review Code  2- meets goals/outcomes      AED/CPR: - Group verbal and written instruction with the use of models to demonstrate the basic use of the AED with the basic ABC's of resuscitation.   Breathing Retraining: - Provides individuals verbal and written instruction on purpose, frequency, and proper technique of diaphragmatic breathing and pursed-lipped breathing. Applies individual practice skills.   Anatomy and Physiology of the Lungs: - Group verbal and written instruction with the use of models to provide basic lung anatomy and physiology related to function, structure and complications of lung disease.   Heart Failure: - Group verbal and written instruction on the basics of heart failure: signs/symptoms, treatments, explanation of ejection fraction, enlarged heart and cardiomyopathy.   Sleep  Apnea: - Individual verbal and written instruction to review Obstructive Sleep Apnea. Review of risk factors, methods for diagnosing and types of masks and machines for OSA.   Anxiety: - Provides group, verbal and written instruction on the correlation between heart/lung disease and anxiety, treatment options, and management of anxiety.   Relaxation: - Provides group, verbal and written instruction about the benefits of relaxation for patients with heart/lung disease. Also provides patients with examples of relaxation techniques.   Knowledge Questionnaire Score:     Knowledge Questionnaire Score - 01/27/16 0900    Knowledge Questionnaire Score   Pre Score 6/10      Personal Goals and Risk Factors at Admission:     Personal Goals and Risk Factors at Admission - 01/27/16 0900    Core Components/Risk Factors/Patient Goals on Admission   Sedentary Yes   Intervention Provide advice, education, support and counseling about physical activity/exercise needs.;Develop an individualized exercise prescription for aerobic and resistive training based on initial evaluation findings, risk  stratification, comorbidities and participant's personal goals.  [name] had pneumonia in December and has a long recovery period. She previously exercised at the [facility] and is looking forward to return to her exercise at their facility.   Expected Outcomes Achievement of increased cardiorespiratory fitness and enhanced flexibility, muscular endurance and strength shown through measurements of functional capaciy and personal statement of participant.   Improve shortness of breath with ADL's Yes   Intervention Provide education, individualized exercise plan and daily activity instruction to help decrease symptoms of SOB with activities of daily living.  Ms gebhardt rates stairs and hills a "3" on shortness of breath questionnaire.   Expected Outcomes Short Term: Achieves a reduction of symptoms  when performing activities of daily living.   Develop more efficient breathing techniques such as purse lipped breathing and diaphragmatic breathing; and practicing self-pacing with activity Yes   Intervention Provide education, demonstration and support about specific breathing techniuqes utilized for more efficient breathing. Include techniques such as pursed lipped breathing, diaphragmatic breathing and self-pacing activity.   Expected Outcomes Short Term: Participant will be able to demonstrate and use breathing techniques as needed throughout daily activities.   Increase knowledge of respiratory medications and ability to use respiratory devices properly  Yes   Intervention Provide education and demonstration as needed of appropriate use of medications, inhalers, and oxygen therapy.  Ms Shorb takes Spiriva and wears 2l/m [place] at night with sleep. She is being tested this week to see if she still needs the oxygen at night.   Expected Outcomes Short Term: Achieves understanding of medications use. Understands that oxygen is a medication prescribed by physician. Demonstrates appropriate use of inhaler and oxygen therapy.      Personal Goals and Risk Factors Review:    Personal Goals Discharge (Final Personal Goals and Risk Factors Review):    ITP Comments:   Comments:

## 2016-01-27 NOTE — Patient Instructions (Signed)
Patient Instructions  Patient Details  Name: Cheryl Cannon MRN: [id number] Date of Birth: 06-10-1936 Referring Provider:  Erby Pian, MD  Below are the personal goals you chose as well as exercise and nutrition goals. Our goal is to help you keep on track towards obtaining and maintaining your goals. We will be discussing your progress on these goals with you throughout the program.  Initial Exercise Prescription:     Initial Exercise Prescription - 01/27/16 1200    Date of Initial Exercise Prescription   Date 01/27/16   Treadmill   MPH 1.5   Grade 0   Minutes 10   Recumbant Bike   Level 2   RPM 40   Watts 20   Minutes 10   NuStep   Level 2   Watts 40   Minutes 10   Arm Ergometer   Level 1   Watts 10   Minutes 10   Recumbant Elliptical   Level 1   RPM 40   Watts 20   Minutes 10   REL-XR   Level 2   Watts 40   Minutes 10   T5 Nustep   Level 1   Watts 15   Minutes 10   Biostep-RELP   Level 2   Watts 40   Minutes 10   Prescription Details   Frequency (times per week) 3   Duration Progress to 30 minutes of continuous aerobic without signs/symptoms of physical distress   Intensity   THRR REST +  30   Ratings of Perceived Exertion 11-15   Perceived Dyspnea 0-4   Progression   Progression Continue progressive overload as per policy without signs/symptoms or physical distress.   Resistance Training   Training Prescription Yes   Weight 1   Reps 10-15      Exercise Goals: Frequency: Be able to perform aerobic exercise three times per week working toward 3-5 days per week.  Intensity: Work with a perceived exertion of 11 (fairly light) - 15 (hard) as tolerated. Follow your new exercise prescription and watch for changes in prescription as you progress with the program. Changes will be reviewed with you when they are made.  Duration: You should be able to do 30 minutes of continuous aerobic exercise in addition to a 5 minute warm-up and a 5 minute  cool-down routine.  Nutrition Goals: Your personal nutrition goals will be established when you do your nutrition analysis with the dietician.  The following are nutrition guidelines to follow: Cholesterol < '200mg'$ /day Sodium < '1500mg'$ /day Fiber: Women over 50 yrs - 21 grams per day  Personal Goals:     Personal Goals and Risk Factors at Admission - 01/27/16 0900    Core Components/Risk Factors/Patient Goals on Admission   Sedentary Yes   Intervention Provide advice, education, support and counseling about physical activity/exercise needs.;Develop an individualized exercise prescription for aerobic and resistive training based on initial evaluation findings, risk stratification, comorbidities and participant's personal goals.  Cheryl Cannon had pneumonia in December and has a long recovery period. She previously exercised at the [facility] and is looking forward to return to her exercise at their facility.   Expected Outcomes Achievement of increased cardiorespiratory fitness and enhanced flexibility, muscular endurance and strength shown through measurements of functional capaciy and personal statement of participant.   Improve shortness of breath with ADL's Yes   Intervention Provide education, individualized exercise plan and daily activity instruction to help decrease symptoms of SOB with activities of  daily living.  Cheryl Cannon rates stairs and hills a "3" on shortness of breath questionnaire.   Expected Outcomes Short Term: Achieves a reduction of symptoms when performing activities of daily living.   Develop more efficient breathing techniques such as purse lipped breathing and diaphragmatic breathing; and practicing self-pacing with activity Yes   Intervention Provide education, demonstration and support about specific breathing techniuqes utilized for more efficient breathing. Include techniques such as pursed lipped breathing, diaphragmatic breathing and self-pacing  activity.   Expected Outcomes Short Term: Participant will be able to demonstrate and use breathing techniques as needed throughout daily activities.   Increase knowledge of respiratory medications and ability to use respiratory devices properly  Yes   Intervention Provide education and demonstration as needed of appropriate use of medications, inhalers, and oxygen therapy.  Cheryl Cannon takes Spiriva and wears 2l/m  at night with sleep. She is being tested this week to see if she still needs the oxygen at night.   Expected Outcomes Short Term: Achieves understanding of medications use. Understands that oxygen is a medication prescribed by physician. Demonstrates appropriate use of inhaler and oxygen therapy.      Tobacco Use Initial Evaluation: History  Smoking status  . Former Smoker -- 1.00 packs/day for 44 years  . Quit date: 11/22/1998  Smokeless tobacco  . Not on file    Copy of goals given to participant.

## 2016-01-30 ENCOUNTER — Encounter: Payer: Medicare Other | Admitting: *Deleted

## 2016-01-30 DIAGNOSIS — J449 Chronic obstructive pulmonary disease, unspecified: Secondary | ICD-10-CM

## 2016-01-30 NOTE — Progress Notes (Signed)
Daily Session Note  Patient Details  Name: Cheryl Cannon MRN: [id number] Date of Birth: Aug 21, 1936 Referring Provider:  Erby Pian, MD  Encounter Date: 01/30/2016  Check In:     Session Check In - 01/30/16 1042    Check-In   Location [facility]   Staff Present Candiss Norse, MS, ACSM CEP, Exercise Physiologist;Carroll Enterkin, RN, Apolonio Schneiders, BS, Exercise Physiologist;Stacey Blanch Media, RRT, RCP, Respiratory Therapist   Supervising physician immediately available to respond to emergencies [facility] immediately available ER MD   Physician(s) Jimmye Norman and Jacqualine Code   Medication changes reported     No   Fall or balance concerns reported    No   Warm-up and Cool-down Performed on first and last piece of equipment   Resistance Training Performed Yes   VAD Patient? No   Pain Assessment   Currently in Pain? No/denies   Multiple Pain Sites No           Exercise Prescription Changes - 01/30/16 1000    Response to Exercise   Symptoms None   Comments First day of exercise! Patient was oriented to the gym and the equipment functions and settings. Procedures and policies of the gym were outlined and explained. The patient's individual exercise prescription and treatment plan were reviewed with them. All starting workloads were established based on the results of the functional testing  done at the initial intake visit. The plan for exercise progression was also introduced and progression will be customized based on the patient's performance and goals.    Duration Progress to 30 minutes of continuous aerobic without signs/symptoms of physical distress   Intensity Rest + 30   Progression   Progression Continue progressive overload as per policy without signs/symptoms or physical distress.   Resistance Training   Training Prescription Yes   Weight 1   Reps 10-15   Treadmill   MPH 1.5   Grade 0   Minutes 10   Recumbant Bike   Level 2   RPM 40   Watts 20    Minutes 10   NuStep   Level 2   Watts 40   Minutes 10   Arm Ergometer   Level 1   Watts 10   Minutes 10   Recumbant Elliptical   Level 1   RPM 40   Watts 20   Minutes 10   REL-XR   Level 2   Watts 40   Minutes 10   T5 Nustep   Level 1   Watts 15   Minutes 10   Biostep-RELP   Level 2   Watts 40   Minutes 10      Goals Met:  Proper associated with RPD/PD & O2 Sat Exercise tolerated well Personal goals reviewed Queuing for purse lip breathing Strength training completed today  Goals Unmet:  Not Applicable  Comments: Patient completed exercise prescription and all exercise goals during rehab session. The exercise was tolerated well and the patient is progressing in the program.    Dr. Emily Filbert is Medical Director for [place] and [facility].

## 2016-02-02 ENCOUNTER — Encounter: Payer: Medicare Other | Admitting: *Deleted

## 2016-02-02 DIAGNOSIS — J449 Chronic obstructive pulmonary disease, unspecified: Secondary | ICD-10-CM

## 2016-02-02 NOTE — Progress Notes (Signed)
Daily Session Note  Patient Details  Name: Cheryl Cannon MRN: [id number] Date of Birth: 06-Sep-1936 Referring Provider:  Erby Pian, MD  Encounter Date: 02/02/2016  Check In:     Session Check In - 02/02/16 1116    Check-In   Location [facility]   Staff Present Earlean Shawl, BS, ACSM CEP, Exercise Physiologist;Steven Way, BS, ACSM EP-C, Exercise Physiologist;Laureen Owens Shark, BS, RRT, Respiratory Therapist   Supervising physician immediately available to respond to emergencies [facility] immediately available ER MD   Physician(s) Corky Downs and Paduchowski   Medication changes reported     No   Fall or balance concerns reported    No   Warm-up and Cool-down Performed on first and last piece of equipment   Resistance Training Performed Yes   VAD Patient? No   Pain Assessment   Currently in Pain? No/denies   Multiple Pain Sites No         Goals Met:  Proper associated with RPD/PD & O2 Sat Independence with exercise equipment Exercise tolerated well Strength training completed today  Goals Unmet:  Not Applicable  Comments: Patient completed exercise prescription and all exercise goals during rehab session. The exercise was tolerated well and the patient is progressing in the program.     Dr. Emily Filbert is Medical Director for [place] and [facility].

## 2016-02-04 ENCOUNTER — Encounter: Payer: Medicare Other | Admitting: *Deleted

## 2016-02-04 DIAGNOSIS — J449 Chronic obstructive pulmonary disease, unspecified: Secondary | ICD-10-CM

## 2016-02-04 NOTE — Progress Notes (Signed)
Daily Session Note  Patient Details  Name: Cheryl Cannon MRN: [id number] Date of Birth: May 29, 1936 Referring Provider:  Erby Pian, MD  Encounter Date: 02/04/2016  Check In:     Session Check In - 02/04/16 1040    Check-In   Location [facility]   Staff Present Candiss Norse, MS, ACSM CEP, Exercise Physiologist;Steven Way, BS, ACSM EP-C, Exercise Physiologist;Laureen Owens Shark, BS, RRT, Respiratory Therapist   Supervising physician immediately available to respond to emergencies [facility] immediately available ER MD   Physician(s) Archie Balboa and Marcelene Butte   Medication changes reported     No   Fall or balance concerns reported    No   Warm-up and Cool-down Performed on first and last piece of equipment   Resistance Training Performed Yes   VAD Patient? No   Pain Assessment   Currently in Pain? No/denies   Multiple Pain Sites No           Exercise Prescription Changes - 02/04/16 1000    Exercise Review   Progression Yes   Response to Exercise   Symptoms None   Comments Reviewed individualized exercise prescription and made increases per departmental policy. Exercise increases were discussed with the patient and they were able to perform the new work loads without issue (no signs or symptoms).    Duration Progress to 30 minutes of continuous aerobic without signs/symptoms of physical distress   Intensity Rest + 30   Progression   Progression Continue progressive overload as per policy without signs/symptoms or physical distress.   Resistance Training   Training Prescription Yes   Weight 1   Reps 10-15   Treadmill   MPH 1.1   Grade 0   Minutes 10   NuStep   Level 2   Watts 16   Minutes 12   Biostep-RELP   Level 1   Watts 15   Minutes 10      Goals Met:  Proper associated with RPD/PD & O2 Sat Independence with exercise equipment Using PLB without cueing & demonstrates good technique Exercise tolerated well Personal goals reviewed Strength  training completed today  Goals Unmet:  Not Applicable  Comments: Patient completed exercise prescription and all exercise goals during rehab session. The exercise was tolerated well and the patient is progressing in the program.    Dr. Emily Filbert is Medical Director for [place] and [facility].

## 2016-02-06 ENCOUNTER — Encounter: Payer: Medicare Other | Admitting: *Deleted

## 2016-02-06 DIAGNOSIS — J449 Chronic obstructive pulmonary disease, unspecified: Secondary | ICD-10-CM | POA: Diagnosis not present

## 2016-02-06 NOTE — Progress Notes (Signed)
Daily Session Note  Patient Details  Name: Cheryl Cannon MRN: [id number] Date of Birth: 10/03/1936 Referring Provider:  Singh, Jasmine, MD  Encounter Date: 02/06/2016  Check In:     Session Check In - 02/06/16 1127    Check-In   Location [facility]   Staff Present Susanne Bice, RN, BSN, CCRP;Carroll Enterkin, RN, BSN; , MS, ACSM CEP, Exercise Physiologist;Stacey Joyce, RRT, RCP, Respiratory Therapist   Supervising physician immediately available to respond to emergencies [facility] immediately available ER MD   Physician(s) Kinner and Williams   Medication changes reported     No   Fall or balance concerns reported    No   Warm-up and Cool-down Performed on first and last piece of equipment   Resistance Training Performed Yes   VAD Patient? No   Pain Assessment   Currently in Pain? No/denies   Multiple Pain Sites No           Exercise Prescription Changes - 02/06/16 1100    Exercise Review   Progression Yes   Response to Exercise   Symptoms None   Comments Increased time on Biostep machine to further progress patient's stamina. Patient complied with increase and demonstrated ability.    Duration Progress to 30 minutes of continuous aerobic without signs/symptoms of physical distress   Intensity Rest + 30   Progression   Progression Continue progressive overload as per policy without signs/symptoms or physical distress.   Resistance Training   Training Prescription Yes   Weight 1   Reps 10-15   Treadmill   MPH 1.1   Grade 0   Minutes 10   NuStep   Level 2   Watts 16   Minutes 12   Biostep-RELP   Level 1   Watts 15   Minutes 15      Goals Met:  Proper associated with RPD/PD & O2 Sat Independence with exercise equipment Using PLB without cueing & demonstrates good technique Exercise tolerated well Personal goals reviewed Strength training completed today  Goals Unmet:  Not Applicable  Comments: Patient completed  exercise prescription and all exercise goals during rehab session. The exercise was tolerated well and the patient is progressing in the program.   Dr. Mark Miller is Medical Director for [facility] and [facility]. 

## 2016-02-09 ENCOUNTER — Encounter: Payer: Medicare Other | Admitting: *Deleted

## 2016-02-09 DIAGNOSIS — J449 Chronic obstructive pulmonary disease, unspecified: Secondary | ICD-10-CM

## 2016-02-09 NOTE — Progress Notes (Signed)
Daily Session Note  Patient Details  Name: Cheryl Cannon MRN: [id number] Date of Birth: [birth year] Referring Provider:  Erby Pian, MD  Encounter Date: 02/09/2016  Check In:     Session Check In - 02/09/16 1119    Check-In   Staff Present Earlean Shawl, BS, ACSM CEP, Exercise Physiologist;Steven Way, BS, ACSM EP-C, Exercise Physiologist;Laureen Owens Shark, BS, RRT, Respiratory Therapist   Supervising physician immediately available to respond to emergencies [facility] immediately available ER MD   Physician(s) Owens Shark and Reita Cliche   Medication changes reported     No   Fall or balance concerns reported    No   Warm-up and Cool-down Performed on first and last piece of equipment   Resistance Training Performed Yes   VAD Patient? No   Pain Assessment   Currently in Pain? No/denies   Multiple Pain Sites No           Exercise Prescription Changes - 02/09/16 1100    Exercise Review   Progression Yes   Response to Exercise   Symptoms None   Duration Progress to 30 minutes of continuous aerobic without signs/symptoms of physical distress   Intensity Rest + 30   Progression   Progression Continue progressive overload as per policy without signs/symptoms or physical distress.   Resistance Training   Training Prescription Yes   Weight 1   Reps 10-15   Treadmill   MPH 1.1   Grade 0   Minutes 10   NuStep   Level 3   Watts 30   Minutes 15   Biostep-RELP   Level 1   Watts 15   Minutes 15      Goals Met:  Proper associated with RPD/PD & O2 Sat Independence with exercise equipment Exercise tolerated well Personal goals reviewed Strength training completed today  Goals Unmet:  Not Applicable  Comments: Reviewed individualized exercise prescription and made increases per departmental policy. Exercise increases were discussed with the patient and they were able to perform the new work loads without issue (no signs or symptoms).     Dr. Emily Filbert is Medical Director for  [place] and [facility].

## 2016-02-09 NOTE — Progress Notes (Signed)
Pulmonary Individual Treatment Plan  Patient Details  Name: Cheryl Cannon MRN: [id number] Date of Birth: [birth year] Referring Provider:  Erby Pian, MD  Initial Encounter Date:       Pulmonary Rehab from 01/27/2016 in [facility] Cardiac and Pulmonary Rehab   Date  01/27/16      Visit Diagnosis: COPD, moderate ([place])  Patient's Home Medications on Admission:  Current outpatient prescriptions:  .  ALPRAZolam (XANAX) 0.25 MG tablet, Take 0.25 mg by mouth daily as needed for anxiety. , Disp: , Rfl:  .  amoxicillin-clavulanate (AUGMENTIN) 875-125 MG tablet, Take 1 tablet by mouth every 12 (twelve) hours., Disp: 14 tablet, Rfl: 0 .  amoxicillin-clavulanate (AUGMENTIN) 875-125 MG tablet, Take 1 tablet by mouth 2 (two) times daily. For 7 days, Disp: , Rfl:  .  apixaban (ELIQUIS) 5 MG TABS tablet, Take 2 tablets (10 mg total) by mouth 2 (two) times daily., Disp: 10 tablet, Rfl: 0 .  apixaban (ELIQUIS) 5 MG TABS tablet, Take 1 tablet (5 mg total) by mouth 2 (two) times daily., Disp: 60 tablet, Rfl: 0 .  apixaban (ELIQUIS) 5 MG TABS tablet, Take 5 mg by mouth., Disp: , Rfl:  .  apixaban (ELIQUIS) 5 MG TABS tablet, Take 5 mg by mouth 2 (two) times daily., Disp: , Rfl:  .  cholecalciferol (VITAMIN D) 1000 UNITS tablet, Take 2,000 Units by mouth daily. , Disp: , Rfl:  .  clobetasol ointment (TEMOVATE) 9.32 %, Apply 1 application topically 3 (three) times a week. Uses Mon, Wed, Fri, Disp: , Rfl:  .  cyanocobalamin (,VITAMIN B-12,) 1000 MCG/ML injection, Inject 1,000 mcg into the muscle every 30 (thirty) days. Injection once a month, Disp: , Rfl:  .  cyanocobalamin (,VITAMIN B-12,) 1000 MCG/ML injection, Inject into the muscle., Disp: , Rfl:  .  docusate sodium (COLACE) 100 MG capsule, Take 100 mg by mouth 2 (two) times daily., Disp: , Rfl:  .  donepezil (ARICEPT) 10 MG tablet, Take 1 tablet (10 mg total) by mouth at bedtime., Disp: 90 tablet, Rfl: 3 .  donepezil (ARICEPT) 10 MG tablet, Take 10 mg by  mouth at bedtime., Disp: , Rfl:  .  flecainide (TAMBOCOR) 50 MG tablet, Take 75 mg by mouth 2 (two) times daily., Disp: , Rfl:  .  flecainide (TAMBOCOR) 50 MG tablet, Take by mouth., Disp: , Rfl:  .  flecainide (TAMBOCOR) 50 MG tablet, Take 1.5 mg by mouth 2 (two) times daily., Disp: , Rfl:  .  furosemide (LASIX) 20 MG tablet, Take 20 mg by mouth daily as needed for fluid or edema. , Disp: , Rfl:  .  furosemide (LASIX) 20 MG tablet, Take by mouth., Disp: , Rfl:  .  furosemide (LASIX) 20 MG tablet, Take 1 tablet (20 mg total) by mouth daily., Disp: 30 tablet, Rfl: 0 .  gabapentin (NEURONTIN) 300 MG capsule, Take 4 capsules in the morning and 4 capsules in the evening (Patient taking differently: Take 1,200 mg by mouth 2 (two) times daily. ), Disp: 720 capsule, Rfl: 2 .  gabapentin (NEURONTIN) 300 MG capsule, Take 1,200 mg by mouth., Disp: , Rfl:  .  gabapentin (NEURONTIN) 300 MG capsule, Take 1,200 mg by mouth 2 (two) times daily., Disp: , Rfl:  .  lactulose (CHRONULAC) 10 GM/15ML solution, Take 10 g by mouth daily as needed for mild constipation., Disp: , Rfl:  .  levalbuterol (XOPENEX HFA) 45 MCG/ACT inhaler, Inhale 2 puffs into the lungs every 4 (four) hours as needed  for wheezing., Disp: , Rfl:  .  levalbuterol (XOPENEX HFA) 45 MCG/ACT inhaler, Inhale 2 puffs into the lungs every 6 (six) hours as needed for wheezing., Disp: , Rfl:  .  metoprolol succinate (TOPROL-XL) 25 MG 24 hr tablet, Take 12.5 mg by mouth daily., Disp: , Rfl:  .  metoprolol tartrate (LOPRESSOR) 25 MG tablet, Take 12.5 mg by mouth daily. , Disp: , Rfl:  .  metoprolol tartrate (LOPRESSOR) 25 MG tablet, Take 12.5 mg by mouth., Disp: , Rfl:  .  nortriptyline (PAMELOR) 10 MG capsule, Take 1 capsule at bedtime for 1 week, then increase to 2 capsules at night (Patient taking differently: Take 20 mg by mouth at bedtime. ), Disp: 180 capsule, Rfl: 3 .  pantoprazole (PROTONIX) 40 MG tablet, Take 40 mg by mouth daily. , Disp: , Rfl:  .   pantoprazole (PROTONIX) 40 MG tablet, Take 40 mg by mouth daily., Disp: , Rfl:  .  tiotropium (SPIRIVA) 18 MCG inhalation capsule, Place 18 mcg into inhaler and inhale daily. , Disp: , Rfl:  .  tiotropium (SPIRIVA) 18 MCG inhalation capsule, Place 18 mcg into inhaler and inhale daily., Disp: , Rfl:   Past Medical History: Past Medical History  Diagnosis Date  . Lung cancer ([place])   . COPD (chronic obstructive pulmonary disease) ([place])   . Cancer ([place])   . Memory deficit   . Celiac disease   . A-fib ([place])     Tobacco Use: History  Smoking status  . Former Smoker -- 1.00 packs/day for 44 years  . Quit date: 11/22/1998  Smokeless tobacco  . Not on file    Labs: Recent Review Flowsheet Data    Labs for ITP Cardiac and Pulmonary Rehab Latest Ref Rng 11/11/2015   Hemoglobin A1c 4.0 - 6.0 % 5.3       ADL UCSD:     ADL UCSD      01/27/16 0900       ADL UCSD   ADL Phase Entry     SOB Score total 10     Rest 0     Walk 0     Stairs 3     Bath 0     Dress 0     Shop 0        Pulmonary Function Assessment:     Pulmonary Function Assessment - 01/27/16 0900    Pulmonary Function Tests   RV% 88 %   DLCO% 58 %   Initial Spirometry Results   FVC% 92 %  Test date2/11/2014   FEV1% 74 %   FEV1/FVC Ratio 61   Post Bronchodilator Spirometry Results   FVC% 92 %   FEV1% 74 %   FEV1/FVC Ratio 61   Breath   Bilateral Breath Sounds Clear;Decreased  Decreased on right - lobectemy mid and lower sections   Shortness of Breath No      Exercise Target Goals:    Exercise Program Goal: Individual exercise prescription set with THRR, safety & activity barriers. Participant demonstrates ability to understand and report RPE using BORG scale, to self-measure pulse accurately, and to acknowledge the importance of the exercise prescription.  Exercise Prescription Goal: Starting with aerobic activity 30 plus minutes a day, 3 days per week for initial exercise prescription.  Provide home exercise prescription and guidelines that participant acknowledges understanding prior to discharge.  Activity Barriers & Risk Stratification:     Activity Barriers & Cardiac Risk Stratification - 01/27/16 0900    Activity  Barriers & Cardiac Risk Stratification   Activity Barriers Deconditioning   Cardiac Risk Stratification Low      6 Minute Walk:     6 Minute Walk      01/27/16 1254       6 Minute Walk   Phase Initial     Distance 855 feet     Walk Time 6 minutes     # of Rest Breaks 0     RPE 13     Perceived Dyspnea  3     Symptoms No     Resting HR 71 bpm     Resting BP 106/64 mmHg     Max Ex. HR 91 bpm     Max Ex. BP 124/72 mmHg        Initial Exercise Prescription:     Initial Exercise Prescription - 01/27/16 1200    Date of Initial Exercise Prescription   Date 01/27/16   Treadmill   MPH 1.5   Grade 0   Minutes 10   Recumbant Bike   Level 2   RPM 40   Watts 20   Minutes 10   NuStep   Level 2   Watts 40   Minutes 10   Arm Ergometer   Level 1   Watts 10   Minutes 10   Recumbant Elliptical   Level 1   RPM 40   Watts 20   Minutes 10   REL-XR   Level 2   Watts 40   Minutes 10   T5 Nustep   Level 1   Watts 15   Minutes 10   Biostep-RELP   Level 2   Watts 40   Minutes 10   Prescription Details   Frequency (times per week) 3   Duration Progress to 30 minutes of continuous aerobic without signs/symptoms of physical distress   Intensity   THRR REST +  30   Ratings of Perceived Exertion 11-15   Perceived Dyspnea 0-4   Progression   Progression Continue progressive overload as per policy without signs/symptoms or physical distress.   Resistance Training   Training Prescription Yes   Weight 1   Reps 10-15      Perform Capillary Blood Glucose checks as needed.  Exercise Prescription Changes:     Exercise Prescription Changes      01/30/16 1000 02/02/16 1500 02/04/16 1000 02/06/16 1100 02/09/16 1100   Exercise  Review   Progression Yes Yes Yes Yes Yes   Response to Exercise   Blood Pressure (Admit) 134/64 mmHg       Blood Pressure (Exercise) 120/62 mmHg       Blood Pressure (Exit) 118/70 mmHg       Heart Rate (Admit) 75 bpm       Heart Rate (Exercise) 104 bpm       Heart Rate (Exit) 95 bpm       Oxygen Saturation (Admit) 94 %       Oxygen Saturation (Exercise) 92 %       Oxygen Saturation (Exit) 96 %       Rating of Perceived Exertion (Exercise) 15       Perceived Dyspnea (Exercise) 3       Symptoms None None None None None   Comments First day of exercise! Patient was oriented to the gym and the equipment functions and settings. Procedures and policies of the gym were outlined and explained. The patient's individual exercise prescription and treatment plan were reviewed with  them. All starting workloads were established based on the results of the functional testing  done at the initial intake visit. The plan for exercise progression was also introduced and progression will be customized based on the patient's performance and goals.  BX was too difficult for Ms Rochin, so she was changed to the BioStep and did tolerate this goals well. Reviewed individualized exercise prescription and made increases per departmental policy. Exercise increases were discussed with the patient and they were able to perform the new work loads without issue (no signs or symptoms).  Increased time on Biostep machine to further progress patient's stamina. Patient complied with increase and demonstrated ability.     Duration Progress to 30 minutes of continuous aerobic without signs/symptoms of physical distress  Progress to 30 minutes of continuous aerobic without signs/symptoms of physical distress Progress to 30 minutes of continuous aerobic without signs/symptoms of physical distress Progress to 30 minutes of continuous aerobic without signs/symptoms of physical distress   Intensity Rest + 30 Rest + 30 Rest + 30 Rest + 30  Rest + 30   Progression   Progression Continue progressive overload as per policy without signs/symptoms or physical distress. Continue progressive overload as per policy without signs/symptoms or physical distress. Continue progressive overload as per policy without signs/symptoms or physical distress. Continue progressive overload as per policy without signs/symptoms or physical distress. Continue progressive overload as per policy without signs/symptoms or physical distress.   Resistance Training   Training Prescription Yes Yes Yes Yes Yes   Weight 1 1 1 1 1    Reps 10-15 10-15 10-15 10-15 10-15   Treadmill   MPH 1.5 1.1 1.1 1.1 1.1   Grade 0 0 0 0 0   Minutes 10 10 10 10 10    Recumbant Bike   Level 2       RPM 40       Watts 20       Minutes 10       NuStep   Level 2 2 2 2 3    Watts 40 16 16 16 30    Minutes 10 10 12 12 15    Arm Ergometer   Level 1       Watts 10       Minutes 10       Recumbant Elliptical   Level 1       RPM 40       Watts 20       Minutes 10       REL-XR   Level 2       Watts 40       Minutes 10       T5 Nustep   Level 1       Watts 15       Minutes 10       Biostep-RELP   Level 2 1 1 1 1    Watts 40 15 15 15 15    Minutes 10 10 10 15 15       Exercise Comments:     Exercise Comments      02/09/16 1121           Exercise Comments Reviewed individualized exercise prescription and made increases per departmental policy. Exercise increases were discussed with the patient and they were able to perform the new work loads without issue (no signs or symptoms).           Discharge Exercise Prescription (Final Exercise Prescription Changes):     Exercise Prescription  Changes - 02/09/16 1100    Exercise Review   Progression Yes   Response to Exercise   Symptoms None   Duration Progress to 30 minutes of continuous aerobic without signs/symptoms of physical distress   Intensity Rest + 30   Progression   Progression Continue progressive  overload as per policy without signs/symptoms or physical distress.   Resistance Training   Training Prescription Yes   Weight 1   Reps 10-15   Treadmill   MPH 1.1   Grade 0   Minutes 10   NuStep   Level 3   Watts 30   Minutes 15   Biostep-RELP   Level 1   Watts 15   Minutes 15       Nutrition:  Target Goals: Understanding of nutrition guidelines, daily intake of sodium <1542m, cholesterol <2013m calories 30% from fat and 7% or less from saturated fats, daily to have 5 or more servings of fruits and vegetables.  Biometrics:    Nutrition Therapy Plan and Nutrition Goals:     Nutrition Therapy & Goals - 01/27/16 0900    Nutrition Therapy   Diet Ms KaGladerefers not to meet the dietian; she cooks healthy and eats small portion sizes      Nutrition Discharge: Rate Your Plate Scores:   Psychosocial: Target Goals: Acknowledge presence or absence of depression, maximize coping skills, provide positive support system. Participant is able to verbalize types and ability to use techniques and skills needed for reducing stress and depression.  Initial Review & Psychosocial Screening:     Initial Psych Review & Screening - 01/27/16 0900    Family Dynamics   Good Support System? Yes   Comments Ms KaKapustaas good support from her husband and states she has no depression. She is disappointed with the long recovery from her pneumonia, but is looking forward to monitored exercise in [facility] and learning more aboput COPD.   Barriers   Psychosocial barriers to participate in program There are no identifiable barriers or psychosocial needs.;The patient should benefit from training in stress management and relaxation.   Screening Interventions   Interventions Encouraged to exercise      Quality of Life Scores:     Quality of Life - 01/27/16 0900    Quality of Life Scores   Health/Function Pre 29.25 %   Socioeconomic Pre 30 %   Psych/Spiritual Pre 30 %   Family Pre 30 %    GLOBAL Pre 29.66 %      PHQ-9:     Recent Review Flowsheet Data    [facility] 01[facility]   Decreased Interest 0   Down, Depressed, Hopeless 0   PHQ - 2 Score 0   Altered sleeping 0   Tired, decreased energy 0   Change in appetite 0   Feeling bad or failure about yourself  0   Trouble concentrating 0   Suicidal thoughts 0   PHQ-9 Score 0   Difficult doing work/chores Not difficult at all      Psychosocial Evaluation and Intervention:     Psychosocial Evaluation - 02/02/16 1207    Psychosocial Evaluation & Interventions   Interventions Encouraged to exercise with the program and follow exercise prescription;Relaxation education;Stress management education   Comments Counselor met with Ms. KaGentor initial psychosocial evaluation.  She is a [age]ear old who came into this program subsequent to a serious bout with pneumonia that lasted over a month.  She has a strong support  system with a spouse of 28 years and several adult children who live closeby.  She also is actively involved in her local church and lives in a retirement community.  Ms. Sedlak states that she doesn't sleep great but gets at least 6 hours of sleep per night.  She states her appetite is good and she denies a history of or current symptoms of depression or anxiety.  Ms. Koroma states she is typically in a positive mood and other than her health and adjusting to moving to this area several years ago, she has minimal stress in her life.  Ms. Christiano appeared to have some difficulty with her memory during this evaluation and could not remember the name of the College or city that she lived in while attending there when she was younger.  She admitted problems with memory had been ongoing since the move to this area.  Ms. Lamarca has goals to improve her breathing, get into shape and just "be stronger" while in this program.  She plans to work out at the [employer] and maybe even swim in the salt  water pool there following completion of this program.  Counselor will continue to follow with Ms. Pellum throughout this program.       Continued Psychosocial Services Needed Yes  Ms. Titus will benefit from the psychoeducational components of this program, as well as consistent exercise.       Psychosocial Re-Evaluation:  Education: Education Goals: Education classes will be provided on a weekly basis, covering required topics. Participant will state understanding/return demonstration of topics presented.  Learning Barriers/Preferences:     Learning Barriers/Preferences - 01/27/16 0900    Learning Barriers/Preferences   Learning Barriers None   Learning Preferences Group Instruction;Individual Instruction;Pictoral;Skilled Demonstration;Verbal Instruction;Video;Written Material      Education Topics: Initial Evaluation Education: - Verbal, written and demonstration of respiratory meds, RPE/PD scales, oximetry and breathing techniques. Instruction on use of nebulizers and MDIs: cleaning and proper use, rinsing mouth with steroid doses and importance of monitoring MDI activations.          Pulmonary Rehab from 02/02/2016 in [facility] Cardiac and Pulmonary Rehab   Date  01/27/16   Educator  LB   Instruction Review Code  2- meets goals/outcomes      General Nutrition Guidelines/Fats and Fiber: -Group instruction provided by verbal, written material, models and posters to present the general guidelines for heart healthy nutrition. Gives an explanation and review of dietary fats and fiber.   Controlling Sodium/Reading Food Labels: -Group verbal and written material supporting the discussion of sodium use in heart healthy nutrition. Review and explanation with models, verbal and written materials for utilization of the food label.   Exercise Physiology & Risk Factors: - Group verbal and written instruction with models to review the exercise physiology of the cardiovascular system and  associated critical values. Details cardiovascular disease risk factors and the goals associated with each risk factor.   Aerobic Exercise & Resistance Training: - Gives group verbal and written discussion on the health impact of inactivity. On the components of aerobic and resistive training programs and the benefits of this training and how to safely progress through these programs.   Flexibility, Balance, General Exercise Guidelines: - Provides group verbal and written instruction on the benefits of flexibility and balance training programs. Provides general exercise guidelines with specific guidelines to those with heart or lung disease. Demonstration and skill practice provided.   Stress Management: - Provides group verbal and written  instruction about the health risks of elevated stress, cause of high stress, and healthy ways to reduce stress.   Depression: - Provides group verbal and written instruction on the correlation between heart/lung disease and depressed mood, treatment options, and the stigmas associated with seeking treatment.   Exercise & Equipment Safety: - Individual verbal instruction and demonstration of equipment use and safety with use of the equipment.      Pulmonary Rehab from 02/02/2016 in [facility] Cardiac and Pulmonary Rehab   Date  01/30/16   Educator  RM   Instruction Review Code  2- meets goals/outcomes      Infection Prevention: - Provides verbal and written material to individual with discussion of infection control including proper hand washing and proper equipment cleaning during exercise session.      Pulmonary Rehab from 02/02/2016 in [facility] Cardiac and Pulmonary Rehab   Date  01/30/16   Educator  RM   Instruction Review Code  2- meets goals/outcomes      Falls Prevention: - Provides verbal and written material to individual with discussion of falls prevention and safety.      Pulmonary Rehab from 02/02/2016 in [facility] Cardiac and Pulmonary Rehab    Date  01/27/16   Educator  LB   Instruction Review Code  2- meets goals/outcomes      Diabetes: - Individual verbal and written instruction to review signs/symptoms of diabetes, desired ranges of glucose level fasting, after meals and with exercise. Advice that pre and post exercise glucose checks will be done for 3 sessions at entry of program.   Chronic Lung Diseases: - Group verbal and written instruction to review new updates, new respiratory medications, new advancements in procedures and treatments. Provide informative websites and "800" numbers of self-education.   Lung Procedures: - Group verbal and written instruction to describe testing methods done to diagnose lung disease. Review the outcome of test results. Describe the treatment choices: Pulmonary Function Tests, ABGs and oximetry.   Energy Conservation: - Provide group verbal and written instruction for methods to conserve energy, plan and organize activities. Instruct on pacing techniques, use of adaptive equipment and posture/positioning to relieve shortness of breath.   Triggers: - Group verbal and written instruction to review types of environmental controls: home humidity, furnaces, filters, dust mite/pet prevention, HEPA vacuums. To discuss weather changes, air quality and the benefits of nasal washing.   Exacerbations: - Group verbal and written instruction to provide: warning signs, infection symptoms, calling MD promptly, preventive modes, and value of vaccinations. Review: effective airway clearance, coughing and/or vibration techniques. Create an Sports administrator.      Pulmonary Rehab from 02/02/2016 in [facility] Cardiac and Pulmonary Rehab   Date  02/02/16   Educator  LB   Instruction Review Code  2- meets goals/outcomes      Oxygen: - Individual and group verbal and written instruction on oxygen therapy. Includes supplement oxygen, available portable oxygen systems, continuous and intermittent flow rates, oxygen  safety, concentrators, and Medicare reimbursement for oxygen.   Respiratory Medications: - Group verbal and written instruction to review medications for lung disease. Drug class, frequency, complications, importance of spacers, rinsing mouth after steroid MDI's, and proper cleaning methods for nebulizers.      Pulmonary Rehab from 02/02/2016 in [facility] Cardiac and Pulmonary Rehab   Date  01/27/16   Educator  LB   Instruction Review Code  2- meets goals/outcomes      AED/CPR: - Group verbal and written instruction with the use of  models to demonstrate the basic use of the AED with the basic ABC's of resuscitation.   Breathing Retraining: - Provides individuals verbal and written instruction on purpose, frequency, and proper technique of diaphragmatic breathing and pursed-lipped breathing. Applies individual practice skills.      Pulmonary Rehab from 02/02/2016 in [facility] Cardiac and Pulmonary Rehab   Date  01/30/16   Educator  [place]   Instruction Review Code  2- meets goals/outcomes      Anatomy and Physiology of the Lungs: - Group verbal and written instruction with the use of models to provide basic lung anatomy and physiology related to function, structure and complications of lung disease.   Heart Failure: - Group verbal and written instruction on the basics of heart failure: signs/symptoms, treatments, explanation of ejection fraction, enlarged heart and cardiomyopathy.   Sleep Apnea: - Individual verbal and written instruction to review Obstructive Sleep Apnea. Review of risk factors, methods for diagnosing and types of masks and machines for OSA.   Anxiety: - Provides group, verbal and written instruction on the correlation between heart/lung disease and anxiety, treatment options, and management of anxiety.   Relaxation: - Provides group, verbal and written instruction about the benefits of relaxation for patients with heart/lung disease. Also provides patients with examples of  relaxation techniques.   Knowledge Questionnaire Score:     Knowledge Questionnaire Score - 01/27/16 0900    Knowledge Questionnaire Score   Pre Score 6/10       Personal Goals and Risk Factors at Admission:     Personal Goals and Risk Factors at Admission - 01/27/16 0900    Core Components/Risk Factors/Patient Goals on Admission   Sedentary Yes   Intervention Provide advice, education, support and counseling about physical activity/exercise needs.;Develop an individualized exercise prescription for aerobic and resistive training based on initial evaluation findings, risk stratification, comorbidities and participant's personal goals.  [name] had pneumonia in December and has a long recovery period. She previously exercised at the [facility] [place] and is looking forward to return to her exercise at their facility.   Expected Outcomes Achievement of increased cardiorespiratory fitness and enhanced flexibility, muscular endurance and strength shown through measurements of functional capaciy and personal statement of participant.   Improve shortness of breath with ADL's Yes   Intervention Provide education, individualized exercise plan and daily activity instruction to help decrease symptoms of SOB with activities of daily living.  Ms julius rates stairs and hills a "3" on shortness of breath questionnaire.   Expected Outcomes Short Term: Achieves a reduction of symptoms when performing activities of daily living.   Develop more efficient breathing techniques such as purse lipped breathing and diaphragmatic breathing; and practicing self-pacing with activity Yes   Intervention Provide education, demonstration and support about specific breathing techniuqes utilized for more efficient breathing. Include techniques such as pursed lipped breathing, diaphragmatic breathing and self-pacing activity.   Expected Outcomes Short Term: Participant will be able to demonstrate and use  breathing techniques as needed throughout daily activities.   Increase knowledge of respiratory medications and ability to use respiratory devices properly  Yes   Intervention Provide education and demonstration as needed of appropriate use of medications, inhalers, and oxygen therapy.  Ms Capps takes Spiriva and wears 2l/m [place] at night with sleep. She is being tested this week to see if she still needs the oxygen at night.   Expected Outcomes Short Term: Achieves understanding of medications use. Understands that oxygen is a medication prescribed  by physician. Demonstrates appropriate use of inhaler and oxygen therapy.      Personal Goals and Risk Factors Review:      Goals and Risk Factor Review      01/30/16 1243 02/04/16 1509 02/06/16 1343 02/06/16 1348     Core Components/Risk Factors/Patient Goals Review   Personal Goals Review Develop more efficient breathing techniques such as purse lipped breathing and diaphragmatic breathing and practicing self-pacing with activity. Sedentary Improve shortness of breath with ADL's Increase knowledge of respiratory medications and ability to use respiratory devices properly.    Review Reviewed PLB with Ms. Hedgecock today. Ms Fels has increased her time with her exercise goals on the NS and BioStep. She has become more knowledgeable in setting up the equipment and does want to progress with her exercise goals. Ms Doubek does want to continue exercising at the [facility] with her husband. Ms. Kangas says that walking causes her to have the most SOB.  Reviewed PLB with her and talked about taking rest periods while walking and shopping. Ms. Quakenbush says that she is comfortable with the respiratory medications she is taking.  She has no questions at this time and does not need a spcer with the medicines that she is currently taking.    Expected Outcomes She will use this technique while exercising to be able to exercise for longer periods of time.  Continue improving her strength and stamina to add one extra day at the [facility]. Increase in distance she can walk and being able to shop longer. Taking her medications as prescribed should help decreased her SOB.       Personal Goals Discharge (Final Personal Goals and Risk Factors Review):      Goals and Risk Factor Review - 02/06/16 1348    Core Components/Risk Factors/Patient Goals Review   Personal Goals Review Increase knowledge of respiratory medications and ability to use respiratory devices properly.   Review Ms. Woodell says that she is comfortable with the respiratory medications she is taking.  She has no questions at this time and does not need a spcer with the medicines that she is currently taking.   Expected Outcomes Taking her medications as prescribed should help decreased her SOB.      ITP Comments:     ITP Comments      01/30/16 1044           ITP Comments Personal and exercise goals expected to be met in 34 more sessions. Progress on specific individualized goals will be charted in patient's ITP. Upon completion of the program the patient will be comfortable managing exercise goals and progression on their own.           Comments: 30 Day Review

## 2016-02-10 ENCOUNTER — Encounter: Payer: Self-pay | Admitting: Neurology

## 2016-02-10 ENCOUNTER — Ambulatory Visit (INDEPENDENT_AMBULATORY_CARE_PROVIDER_SITE_OTHER): Payer: Medicare Other | Admitting: Neurology

## 2016-02-10 VITALS — BP 108/60 | HR 73 | Ht 61.5 in | Wt 113.0 lb

## 2016-02-10 DIAGNOSIS — F0390 Unspecified dementia without behavioral disturbance: Secondary | ICD-10-CM

## 2016-02-10 DIAGNOSIS — G609 Hereditary and idiopathic neuropathy, unspecified: Secondary | ICD-10-CM | POA: Diagnosis not present

## 2016-02-10 MED ORDER — DONEPEZIL HCL 10 MG PO TABS: 10.0000 mg | ORAL_TABLET | Freq: Every day | ORAL | Status: DC

## 2016-02-10 NOTE — Progress Notes (Signed)
NEUROLOGY FOLLOW UP OFFICE NOTE  Cheryl Cannon [id number]  HISTORY OF PRESENT ILLNESS: I had the pleasure of seeing Cheryl Cannon in follow-up in the neurology clinic on 08/13/2015.  The patient was last seen 3 months ago for mild dementia and peripheral neuropathy. She is again accompanied by her husband who helps supplement the history today. MOCA score in June 2016 was 25/30. She feels her memory is unchanged. She was admitted for a pneumonia in December and has been recovering well, she is wearing her O2 nasal cannula today, reporting that she woke up this morning gasping, she thinks her nocturnal O2 cannula fell off at night. She drives very little and denies getting lost driving. Her husband is in charge of bills. He makes sure she takes her medication. She sometimes asks the same questions 3-4 times repeatedly. No personality changes. She continues to take Aricept '10mg'$  daily without side effects. She has neuropathy and continues to have paresthesias in her hands and soles of her feet that she describes as "annoying but no pain." She feels like she is always walking on sand. She takes Gabapentin '1200mg'$  BID. She did not tolerate nortriptyline. She denies any falls. She denies any headaches, dizziness, diplopia, dysarthria/dysphagia, neck/back pain, bowel/bladder dysfunction.   HPI: This is a very pleasant 80 yo RH woman with a history of non-small cell lung cancer s/p surgery, chemotherapy and radiation, atrial fibrillation s/p pacemaker placement, celiac disease, with mild dementia and neuropathy.  1. Memory loss. She and her husband started noticing memory changes at the beginning of 2015. She reports that her "memory has never been great," but that it became more noticeable where she would forget names of familiar people or forget recent conversations, repeat questions. She occasionally has word-finding difficulties, and has noticed it more lately. Her husband has always made sure she takes  her medications. She cooks and has not left the stove on. She drove without any difficulties, and after recent neuropsychological evaluation, she underwent a road test with the [facility] and has a restricted license. Her husband has been in charge of the bills since her diagnosis of cancer in 2010. Her husband also noted difficulties with reasoning and problem solving. No personality changes or hallucinations. She had seen neurologist Dr. Carmelina Paddock, head CT with and without contrast done in 03/2014 did not show any acute changes, no evidence of brain metastasis. Routine EEG 02/2014 normal. She underwent Neuropsychological evaluation on 03/28/14, results reviewed, impression of mild dementia most likely secondary to Alzheimer's disease (rule out mixed dementia), with significant deficits in memory, semantic fluency, processing speed, and aspects of executive functioning. There was also evidence that these deficits are beginning to interfere with ability to perform complex tasks. Cognitive profile reflective of both cortical and subcortical involvement. There is evidence of hippocampal consolidation dysfunction, which is highly concerning for Alzheimer's disease. She also has several vascular risk factors which could be contributing to subcortical dysfunction and a mixed dementia. She started Aricept, with no side effects except she now "dreams a lot," affecting her sleep.  2. Neuropathy. She reports numbness and tingling in her fingertips over the past year. She states that symptoms "seemed to progress" after she was diagnosed with neuropathy in 03/2014. She now has paresthesias (tingling) from the bottom of her feet to the top of her head. She can't taste as much due to tingling in her tongue. 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 Needle exam not done due to anticoagulation  with Pradaxa. Neuropathy labs available indicate normal SPEP. She is on B12 replacement. She  had been taking gabapentin '900mg'$  BID for 3 years for shingles in the buttock region. She had tried increasing this to '900mg'$  TID but did not notice much difference, no side effects. She denies any neck or back pain. She has occasional generalized body weakness where "my legs won't work," attributed to shortness of breath. No bowel/bladder dysfunction. Celiac disease is controlled on strict diet.   PAST MEDICAL HISTORY: Past Medical History  Diagnosis Date  . Lung cancer ([place])   . COPD (chronic obstructive pulmonary disease) ([place])   . Cancer ([place])   . Memory deficit   . Celiac disease   . A-fib [facility])     MEDICATIONS: Current Outpatient Prescriptions on File Prior to Visit  Medication Sig Dispense Refill  . ALPRAZolam (XANAX) 0.25 MG tablet Take 0.25 mg by mouth daily as needed for anxiety.     Marland Kitchen amoxicillin-clavulanate (AUGMENTIN) 875-125 MG tablet Take 1 tablet by mouth every 12 (twelve) hours. 14 tablet 0  . amoxicillin-clavulanate (AUGMENTIN) 875-125 MG tablet Take 1 tablet by mouth 2 (two) times daily. For 7 days    . apixaban (ELIQUIS) 5 MG TABS tablet Take 2 tablets (10 mg total) by mouth 2 (two) times daily. 10 tablet 0  . apixaban (ELIQUIS) 5 MG TABS tablet Take 1 tablet (5 mg total) by mouth 2 (two) times daily. 60 tablet 0  . apixaban (ELIQUIS) 5 MG TABS tablet Take 5 mg by mouth.    Marland Kitchen apixaban (ELIQUIS) 5 MG TABS tablet Take 5 mg by mouth 2 (two) times daily.    . cholecalciferol (VITAMIN D) 1000 UNITS tablet Take 2,000 Units by mouth daily.     . clobetasol ointment (TEMOVATE) 6.81 % Apply 1 application topically 3 (three) times a week. Uses Mon, Wed, Fri    . cyanocobalamin (,VITAMIN B-12,) 1000 MCG/ML injection Inject 1,000 mcg into the muscle every 30 (thirty) days. Injection once a month    . cyanocobalamin (,VITAMIN B-12,) 1000 MCG/ML injection Inject into the muscle.    . docusate sodium (COLACE) 100 MG capsule Take 100 mg by mouth 2 (two) times daily.    Marland Kitchen donepezil  (ARICEPT) 10 MG tablet Take 1 tablet (10 mg total) by mouth at bedtime. 90 tablet 3  . donepezil (ARICEPT) 10 MG tablet Take 10 mg by mouth at bedtime.    . flecainide (TAMBOCOR) 50 MG tablet Take 75 mg by mouth 2 (two) times daily.    . flecainide (TAMBOCOR) 50 MG tablet Take by mouth.    . flecainide (TAMBOCOR) 50 MG tablet Take 1.5 mg by mouth 2 (two) times daily.    . furosemide (LASIX) 20 MG tablet Take 20 mg by mouth daily as needed for fluid or edema.     . furosemide (LASIX) 20 MG tablet Take by mouth.    . furosemide (LASIX) 20 MG tablet Take 1 tablet (20 mg total) by mouth daily. 30 tablet 0  . gabapentin (NEURONTIN) 300 MG capsule Take 4 capsules in the morning and 4 capsules in the evening (Patient taking differently: Take 1,200 mg by mouth 2 (two) times daily. ) 720 capsule 2  . gabapentin (NEURONTIN) 300 MG capsule Take 1,200 mg by mouth.    . gabapentin (NEURONTIN) 300 MG capsule Take 1,200 mg by mouth 2 (two) times daily.    Marland Kitchen lactulose (CHRONULAC) 10 GM/15ML solution Take 10 g by mouth daily as needed for  mild constipation.    Marland Kitchen levalbuterol (XOPENEX HFA) 45 MCG/ACT inhaler Inhale 2 puffs into the lungs every 4 (four) hours as needed for wheezing.    . levalbuterol (XOPENEX HFA) 45 MCG/ACT inhaler Inhale 2 puffs into the lungs every 6 (six) hours as needed for wheezing.    . metoprolol succinate (TOPROL-XL) 25 MG 24 hr tablet Take 12.5 mg by mouth daily.    . metoprolol tartrate (LOPRESSOR) 25 MG tablet Take 12.5 mg by mouth daily.     . metoprolol tartrate (LOPRESSOR) 25 MG tablet Take 12.5 mg by mouth.    . nortriptyline (PAMELOR) 10 MG capsule Take 1 capsule at bedtime for 1 week, then increase to 2 capsules at night (Patient taking differently: Take 20 mg by mouth at bedtime. ) 180 capsule 3  . pantoprazole (PROTONIX) 40 MG tablet Take 40 mg by mouth daily.     . pantoprazole (PROTONIX) 40 MG tablet Take 40 mg by mouth daily.    Marland Kitchen tiotropium (SPIRIVA) 18 MCG inhalation  capsule Place 18 mcg into inhaler and inhale daily.     Marland Kitchen tiotropium (SPIRIVA) 18 MCG inhalation capsule Place 18 mcg into inhaler and inhale daily.     No current facility-administered medications on file prior to visit.    ALLERGIES: Allergies  Allergen Reactions  . Gluten Meal Nausea And Vomiting  . Gluten Meal Other (See Comments)    GI UPSET  . Parabens Rash    FAMILY HISTORY: Family History  Problem Relation Age of Onset  . Cancer Mother   . Cancer Sister     SOCIAL HISTORY: Social History   Social History  . Marital Status: Married    Spouse Name: N/A  . Number of Children: N/A  . Years of Education: N/A   Occupational History  . Not on file.   Social History Main Topics  . Smoking status: Former Smoker -- 1.00 packs/day for 44 years    Quit date: 11/22/1998  . Smokeless tobacco: Not on file  . Alcohol Use: Yes     Comment: social  . Drug Use: No  . Sexual Activity: Not on file   Other Topics Concern  . Not on file   Social History Narrative   ** Merged History Encounter **        REVIEW OF SYSTEMS: Constitutional: No fevers, chills, or sweats, no generalized fatigue, change in appetite Eyes: No visual changes, double vision, eye pain Ear, nose and throat: No hearing loss, ear pain, nasal congestion, sore throat Cardiovascular: No chest pain, palpitations Respiratory:  No shortness of breath at rest or with exertion, wheezes GastrointestinaI: No nausea, vomiting, diarrhea, abdominal pain, fecal incontinence Genitourinary:  No dysuria, urinary retention or frequency Musculoskeletal:  No neck pain, back pain Integumentary: No rash, pruritus, skin lesions Neurological: as above Psychiatric: No depression, insomnia, anxiety Endocrine: No palpitations, fatigue, diaphoresis, mood swings, change in appetite, change in weight, increased thirst Hematologic/Lymphatic:  No anemia, purpura, petechiae. Allergic/Immunologic: no itchy/runny eyes, nasal  congestion, recent allergic reactions, rashes  PHYSICAL EXAM: Filed Vitals:   02/10/16 1006  BP: 108/60  Pulse: 73   General: No acute distress Head:  Normocephalic/atraumatic Neck: supple, no paraspinal tenderness, full range of motion Heart:  Regular rate and rhythm Lungs:  Clear to auscultation bilaterally Back: No paraspinal tenderness Skin/Extremities: No rash, no edema Neurological Exam: alert and oriented to person, place, and time. No aphasia or dysarthria. Fund of knowledge is appropriate.  Remote memory intact. 3/3 delayed  recall.  Attention and concentration are normal.    Able to name objects and repeat phrases. CDT 4/5 MMSE - Mini Mental State Exam 02/10/2016  Orientation to time 5  Orientation to Place 5  Registration 3  Attention/ Calculation 4  Recall 0  Language- name 2 objects 2  Language- repeat 1  Language- follow 3 step command 3  Language- read & follow direction 1  Write a sentence 1  Copy design 1  Total score 26    Cranial nerves: Pupils equal, round, reactive to light.  Extraocular movements intact with no nystagmus. Visual fields full. Facial sensation intact. No facial asymmetry. Tongue, uvula, palate midline.  Motor: Bulk and tone normal, muscle strength 5/5 throughout with no pronator drift.  Sensation to light touch intact.  No extinction to double simultaneous stimulation.  Deep tendon reflexes 2+ throughout except for absent ankle jerks bilaterally, toes downgoing.  Finger to nose testing intact.  Gait narrow-based and steady, able to tandem walk adequately.  Romberg negative.  IMPRESSION: This is a very pleasant 80 yo RH woman with a history of mild dementia and sensorimotor polyneuropathy, etiology possibly due to chemotherapy. She also has celiac disease which can also cause neuropathy. Celiac disease is under control with diet. She feels memory is stable, MMSE today 26/30. Continue Aricept '10mg'$  daily. She continues to have paresthesias despite  higher dose gabapentin, no side effects. She did not tolerate nortriptyline. We discussed a trial of alpha-lipoic acid, but she would like to hold off on any medication for now. The importance of physical exercise, brain stimulation exercises, and control of vascular risk factors for brain health were again discussed. All her questions were answered. She will follow-up in 1 year and knows to call our office for any changes  Thank you for allowing me to participate in her care.  Please do not hesitate to call for any questions or concerns.  The duration of this appointment visit was 25 minutes of face-to-face time with the patient.  Greater than 50% of this time was spent in counseling, explanation of diagnosis, planning of further management, and coordination of care.   Ellouise Newer, M.D.   CC: Dr. Candiss Norse

## 2016-02-10 NOTE — Patient Instructions (Signed)
1. Continue Aricept '10mg'$  daily 2. If you want to look into Alpha-Lipoic acid '400mg'$  daily to help with neuropathy 3. Physical exercise and brain stimulation exercises are important for health 4. Follow-up in 1 year, call for any changes

## 2016-02-13 ENCOUNTER — Encounter: Payer: Medicare Other | Admitting: *Deleted

## 2016-02-13 DIAGNOSIS — J449 Chronic obstructive pulmonary disease, unspecified: Secondary | ICD-10-CM | POA: Diagnosis not present

## 2016-02-13 NOTE — Progress Notes (Signed)
Daily Session Note  Patient Details  Name: SHAWNAY BRAMEL MRN: [id number] Date of Birth: 18-Oct-1936 Referring Provider:  Erby Pian, MD  Encounter Date: 02/13/2016  Check In:     Session Check In - 02/13/16 1054    Check-In   Location [facility]   Staff Present Heath Lark, RN, BSN, CCRP;Roemello Speyer, RN, BSN;Stacey Blanch Media, RRT, RCP, Respiratory Therapist   Supervising physician immediately available to respond to emergencies [facility] immediately available ER MD   Physician(s) Carloyn Manner is using his walker all the time now.    Medication changes reported     No   Fall or balance concerns reported    No   Warm-up and Cool-down Performed on first and last piece of equipment   VAD Patient? No   Pain Assessment   Currently in Pain? No/denies           Exercise Prescription Changes - 02/13/16 1000    Exercise Review   Progression Yes   Response to Exercise   Symptoms None   Duration Progress to 30 minutes of continuous aerobic without signs/symptoms of physical distress   Intensity Rest + 30   Progression   Progression Continue progressive overload as per policy without signs/symptoms or physical distress.   Resistance Training   Training Prescription Yes   Weight 1   Reps 10-15   Treadmill   MPH 1.1   Grade 0   Minutes 11   NuStep   Level 3   Watts 30   Minutes 15   Biostep-RELP   Level 1   Watts 15   Minutes 15      Goals Met:  Proper associated with RPD/PD & O2 Sat Exercise tolerated well  Goals Unmet:  Not Applicable  Comments:    Dr. Emily Filbert is Medical Director for [place] and [facility].

## 2016-02-16 ENCOUNTER — Encounter: Payer: Medicare Other | Admitting: *Deleted

## 2016-02-16 DIAGNOSIS — J449 Chronic obstructive pulmonary disease, unspecified: Secondary | ICD-10-CM

## 2016-02-16 NOTE — Progress Notes (Signed)
Daily Session Note  Patient Details  Name: Cheryl Cannon MRN: [id number] Date of Birth: 1936/01/09 Referring Provider:  Erby Pian, MD  Encounter Date: 02/16/2016  Check In:     Session Check In - 02/16/16 1047    Check-In   Location [facility]   Staff Present Candiss Norse, MS, ACSM CEP, Exercise Physiologist;Kelly Amedeo Plenty, BS, ACSM CEP, Exercise Physiologist;Steven Way, BS, ACSM EP-C, Exercise Physiologist;Laureen Owens Shark, BS, RRT, Respiratory Therapist   Supervising physician immediately available to respond to emergencies [facility] immediately available ER MD   Physician(s) Joni Fears and Jimmye Norman   Medication changes reported     No   Fall or balance concerns reported    No   Warm-up and Cool-down Performed on first and last piece of equipment   Resistance Training Performed Yes   VAD Patient? No   Pain Assessment   Currently in Pain? No/denies   Multiple Pain Sites No           Exercise Prescription Changes - 02/16/16 1000    Exercise Review   Progression Yes   Response to Exercise   Symptoms None   Comments Reviewed individualized exercise prescription and made increases per departmental policy. Exercise increases were discussed with the patient and they were able to perform the new work loads without issue (no signs or symptoms).    Duration Progress to 30 minutes of continuous aerobic without signs/symptoms of physical distress   Intensity Rest + 30   Progression   Progression Continue progressive overload as per policy without signs/symptoms or physical distress.   Resistance Training   Training Prescription Yes   Weight 1   Reps 10-15   Treadmill   MPH 1.1   Grade 0   Minutes 12   NuStep   Level 3   Watts 30   Minutes 15   Biostep-RELP   Level 1   Watts 15   Minutes 15      Goals Met:  Proper associated with RPD/PD & O2 Sat Independence with exercise equipment Using PLB without cueing & demonstrates good  technique Exercise tolerated well Personal goals reviewed Strength training completed today  Goals Unmet:  Not Applicable  Comments: Patient completed exercise prescription and all exercise goals during rehab session. The exercise was tolerated well and the patient is progressing in the program.    Dr. Emily Filbert is Medical Director for [place] and [facility].

## 2016-02-18 ENCOUNTER — Encounter: Payer: Medicare Other | Admitting: *Deleted

## 2016-02-18 DIAGNOSIS — J449 Chronic obstructive pulmonary disease, unspecified: Secondary | ICD-10-CM | POA: Diagnosis not present

## 2016-02-18 NOTE — Progress Notes (Signed)
Daily Session Note  Patient Details  Name: Cheryl Cannon MRN: [id number] Date of Birth: 1936/04/20 Referring Provider:  Erby Pian, MD  Encounter Date: 02/18/2016  Check In:     Session Check In - 02/18/16 1112    Check-In   Location [facility]   Staff Present Candiss Norse, MS, ACSM CEP, Exercise Physiologist;Steven Way, BS, ACSM EP-C, Exercise Physiologist;Laureen Owens Shark, BS, RRT, Respiratory Therapist   Supervising physician immediately available to respond to emergencies [facility] immediately available ER MD   Physician(s) Burlene Arnt and Jimmye Norman   Medication changes reported     No   Fall or balance concerns reported    No   Warm-up and Cool-down Performed on first and last piece of equipment   Resistance Training Performed Yes   VAD Patient? No   Pain Assessment   Currently in Pain? No/denies   Multiple Pain Sites No         Goals Met:  Proper associated with RPD/PD & O2 Sat Independence with exercise equipment Using PLB without cueing & demonstrates good technique Exercise tolerated well Strength training completed today  Goals Unmet:  Not Applicable  Comments: Patient completed exercise prescription and all exercise goals during rehab session. The exercise was tolerated well and the patient is progressing in the program. Not staying for education.   Dr. Emily Filbert is Medical Director for [place] and [facility].

## 2016-02-20 ENCOUNTER — Encounter: Payer: Medicare Other | Admitting: *Deleted

## 2016-02-20 DIAGNOSIS — J449 Chronic obstructive pulmonary disease, unspecified: Secondary | ICD-10-CM | POA: Diagnosis not present

## 2016-02-20 NOTE — Progress Notes (Signed)
Daily Session Note  Patient Details  Name: Cheryl Cannon MRN: [id number] Date of Birth: 02/05/1936 Referring Provider:  Fleming, Herbon E, MD  Encounter Date: 02/20/2016  Check In:     Session Check In - 02/20/16 1100    Check-In   Location [facility]   Staff Present  , MS, ACSM CEP, Exercise Physiologist;Carroll Enterkin, RN, BSN;Stacey Joyce, RRT, RCP, Respiratory Therapist   Supervising physician immediately available to respond to emergencies [facility] immediately available ER MD   Physician(s) Gayle and Schaevitz   Medication changes reported     No   Fall or balance concerns reported    No   Warm-up and Cool-down Performed on first and last piece of equipment   Resistance Training Performed Yes   VAD Patient? No   Pain Assessment   Currently in Pain? No/denies   Multiple Pain Sites No         Goals Met:  Proper associated with RPD/PD & O2 Sat Independence with exercise equipment Using PLB without cueing & demonstrates good technique Exercise tolerated well Strength training completed today  Goals Unmet:  Not Applicable  Comments: Patient completed exercise prescription and all exercise goals during rehab session. The exercise was tolerated well and the patient is progressing in the program.    Dr. Mark Miller is Medical Director for [facility] and [facility]. 

## 2016-02-23 ENCOUNTER — Encounter: Payer: Medicare Other | Attending: Specialist | Admitting: *Deleted

## 2016-02-23 DIAGNOSIS — J449 Chronic obstructive pulmonary disease, unspecified: Secondary | ICD-10-CM | POA: Diagnosis present

## 2016-02-23 NOTE — Progress Notes (Signed)
Daily Session Note  Patient Details  Name: Cheryl Cannon MRN: [id number] Date of Birth: 24-Nov-1935 Referring Provider:  Erby Pian, MD  Encounter Date: 02/23/2016  Check In:     Session Check In - 02/23/16 1145    Check-In   Location [facility]   Staff Present Earlean Shawl, BS, ACSM CEP, Exercise Physiologist;Steven Way, BS, ACSM EP-C, Exercise Physiologist;Laureen Owens Shark, BS, RRT, Respiratory Therapist   Supervising physician immediately available to respond to emergencies [facility] immediately available ER MD   Physician(s) Corky Downs and Edd Fabian   Medication changes reported     No   Fall or balance concerns reported    No   Warm-up and Cool-down Performed on first and last piece of equipment   Resistance Training Performed Yes   VAD Patient? No   Pain Assessment   Currently in Pain? No/denies   Multiple Pain Sites No           Exercise Prescription Changes - 02/23/16 1100    Exercise Review   Progression Yes   Response to Exercise   Symptoms None   Comments Reviewed individualized exercise prescription and made increases per departmental policy. Exercise increases were discussed with the patient and they were able to perform the new work loads without issue (no signs or symptoms).    Duration Progress to 30 minutes of continuous aerobic without signs/symptoms of physical distress   Intensity Rest + 30   Progression   Progression Continue progressive overload as per policy without signs/symptoms or physical distress.   Resistance Training   Training Prescription Yes   Weight 1   Reps 10-15   Treadmill   MPH 1.1   Grade 0   Minutes 13   NuStep   Level 3   Watts 30   Minutes 15   Biostep-RELP   Level 1   Watts 15   Minutes 15      Goals Met:  Proper associated with RPD/PD & O2 Sat Independence with exercise equipment Exercise tolerated well Personal goals reviewed Strength training completed today  Goals Unmet:  Not  Applicable  Comments: Reviewed individualized exercise prescription and made increases per departmental policy. Exercise increases were discussed with the patient and they were able to perform the new work loads without issue (no signs or symptoms).     Dr. Emily Filbert is Medical Director for [place] and [facility].

## 2016-02-25 ENCOUNTER — Encounter: Payer: Medicare Other | Admitting: *Deleted

## 2016-02-25 DIAGNOSIS — J449 Chronic obstructive pulmonary disease, unspecified: Secondary | ICD-10-CM

## 2016-02-25 NOTE — Progress Notes (Signed)
Daily Session Note  Patient Details  Name: Cheryl Cannon MRN: [id number] Date of Birth: 12-07-1935 Referring Provider:  Erby Pian, MD  Encounter Date: 02/25/2016  Check In:     Session Check In - 02/25/16 1058    Check-In   Location [facility]   Staff Present Carson Myrtle, BS, RRT, Respiratory Bertis Ruddy, BS, ACSM CEP, Exercise Physiologist;Basir Niven, RN, BSN;Mary Kellie Shropshire, RN   Supervising physician immediately available to respond to emergencies [facility] immediately available ER MD   Physician(s) Dr. Lovena Le and Dr. Jimmye Norman   Medication changes reported     No   Fall or balance concerns reported    No   Warm-up and Cool-down Performed on first and last piece of equipment   Resistance Training Performed Yes   VAD Patient? No   Pain Assessment   Currently in Pain? No/denies         Goals Met:  Proper associated with RPD/PD & O2 Sat Exercise tolerated well  Goals Unmet:  Not Applicable  Comments:    Dr. Emily Filbert is Medical Director for [facility] and [facility].

## 2016-02-27 ENCOUNTER — Encounter: Payer: Medicare Other | Admitting: *Deleted

## 2016-02-27 DIAGNOSIS — J449 Chronic obstructive pulmonary disease, unspecified: Secondary | ICD-10-CM

## 2016-02-27 NOTE — Progress Notes (Signed)
Daily Session Note  Patient Details  Name: ARYA LUTTRULL MRN: [id number] Date of Birth: 10/16/1936 Referring Provider:  Erby Pian, MD  Encounter Date: 02/27/2016  Check In:     Session Check In - 02/27/16 1136    Check-In   Staff Present Heath Lark, RN, BSN, CCRP;Carroll Enterkin, RN, Michaela Corner, RRT, RCP, Respiratory Therapist   Supervising physician immediately available to respond to emergencies [facility] immediately available ER MD   Physician(s) Tried XR elliptical today instead of the RB. More comfortable for her while exercising.   Medication changes reported     No   Fall or balance concerns reported    No   Warm-up and Cool-down Performed on first and last piece of equipment   Resistance Training Performed Yes   VAD Patient? No   Pain Assessment   Currently in Pain? No/denies   Multiple Pain Sites No         Goals Met:  Proper associated with RPD/PD & O2 Sat Independence with exercise equipment Using PLB without cueing & demonstrates good technique Exercise tolerated well Personal goals reviewed Strength training completed today    Goals Unmet:  Not Applicable  Comments: Doing well with exercise prescription progression. Does require breaks to catch up "breathing"    Dr. Emily Filbert is Medical Director for [facility] and [facility].

## 2016-03-01 ENCOUNTER — Encounter: Payer: Medicare Other | Admitting: *Deleted

## 2016-03-01 DIAGNOSIS — J449 Chronic obstructive pulmonary disease, unspecified: Secondary | ICD-10-CM

## 2016-03-01 NOTE — Progress Notes (Signed)
Daily Session Note  Patient Details  Name: KOURTLYNN TREVOR MRN: [id number] Date of Birth: 1935/12/01 Referring Provider:  Erby Pian, MD  Encounter Date: 03/01/2016  Check In:     Session Check In - 03/01/16 1042    Check-In   Location [facility]   Staff Present Heath Lark, RN, BSN, CCRP;Coline Calkin, RN, Moises Blood, BS, ACSM CEP, Exercise Physiologist   Supervising physician immediately available to respond to emergencies [facility] immediately available ER MD   Physician(s) Dr. Mariea Clonts and Dr. Corky Downs   Medication changes reported     No   Fall or balance concerns reported    No   Resistance Training Performed Yes   Pain Assessment   Currently in Pain? No/denies         Goals Met:  Proper associated with RPD/PD & O2 Sat Exercise tolerated well  Goals Unmet:  Not Applicable  Comments:    Dr. Emily Filbert is Medical Director for [place] and [facility].

## 2016-03-03 ENCOUNTER — Encounter: Payer: Medicare Other | Admitting: *Deleted

## 2016-03-03 DIAGNOSIS — J449 Chronic obstructive pulmonary disease, unspecified: Secondary | ICD-10-CM | POA: Diagnosis not present

## 2016-03-03 NOTE — Progress Notes (Signed)
Daily Session Note  Patient Details  Name: Cheryl Cannon MRN: [id number] Date of Birth: 04/24/1936 Referring Provider:  Fleming, Herbon E, MD  Encounter Date: 03/03/2016  Check In:     Session Check In - 03/03/16 1126    Check-In   Staff Present Mary Jo Abernethy, RN; , RN, BSN, CCRP;Carroll Enterkin, RN, BSN   Supervising physician immediately available to respond to emergencies [facility] immediately available ER MD   Physician(s) Drs: Gayle and McShane   Medication changes reported     No   Fall or balance concerns reported    No   Warm-up and Cool-down Performed on first and last piece of equipment   VAD Patient? No   Pain Assessment   Currently in Pain? No/denies         Goals Met:  Exercise tolerated well Strength training completed today  Goals Unmet:  Not Applicable  Comments: Doing well with exercise prescription progression.    Dr. Mark Miller is Medical Director for [facility] and [facility]. 

## 2016-03-04 ENCOUNTER — Other Ambulatory Visit: Payer: Self-pay | Admitting: Obstetrics and Gynecology

## 2016-03-04 DIAGNOSIS — Z1231 Encounter for screening mammogram for malignant neoplasm of breast: Secondary | ICD-10-CM

## 2016-03-05 ENCOUNTER — Encounter: Payer: Medicare Other | Admitting: *Deleted

## 2016-03-05 DIAGNOSIS — J449 Chronic obstructive pulmonary disease, unspecified: Secondary | ICD-10-CM

## 2016-03-05 NOTE — Progress Notes (Signed)
Daily Session Note  Patient Details  Name: Cheryl Cannon MRN: [id number] Date of Birth: [birth year] Referring Provider:    Encounter Date: 03/05/2016  Check In:     Session Check In - 03/05/16 1149    Check-In   Staff Present Nyoka Cowden, RN;Susanne Bice, RN, BSN, CCRP;Carroll Enterkin, RN, BSN   Supervising physician immediately available to respond to emergencies [facility] immediately available ER MD   Physician(s) Drs: Jacqualine Code and Joni Fears   Medication changes reported     No   Fall or balance concerns reported    No   Warm-up and Cool-down Performed on first and last piece of equipment   VAD Patient? No   Pain Assessment   Currently in Pain? No/denies         Goals Met:  Exercise tolerated well  Goals Unmet:  Not Applicable  Comments: Doing well with exercise prescription progression.    Dr. Emily Filbert is Medical Director for [place] and [facility].

## 2016-03-08 ENCOUNTER — Encounter: Payer: Medicare Other | Admitting: *Deleted

## 2016-03-08 DIAGNOSIS — J449 Chronic obstructive pulmonary disease, unspecified: Secondary | ICD-10-CM | POA: Diagnosis not present

## 2016-03-08 NOTE — Progress Notes (Signed)
Daily Session Note  Patient Details  Name: Cheryl Cannon MRN: [id number] Date of Birth: 02/24/1936 Referring Provider:    Encounter Date: 03/08/2016  Check In:     Session Check In - 03/08/16 1138    Check-In   Location [facility]   Staff Present Laureen Brown, BS, RRT, Respiratory Therapist; , RN, BSN;Kelly Hayes, BS, ACSM CEP, Exercise Physiologist   Supervising physician immediately available to respond to emergencies See telemetry face sheet for immediately available ER MD   Medication changes reported     No   Fall or balance concerns reported    No   Warm-up and Cool-down Performed on first and last piece of equipment   Resistance Training Performed Yes   VAD Patient? No   Pain Assessment   Currently in Pain? No/denies         Goals Met:  Proper associated with RPD/PD & O2 Sat Exercise tolerated well  Goals Unmet:  Not Applicable  Comments:    Dr. Mark Miller is Medical Director for [facility] and [facility]. 

## 2016-03-08 NOTE — Progress Notes (Signed)
Pulmonary Individual Treatment Plan  Patient Details  Name: Cheryl Cannon MRN: [id number] Date of Birth: [birth year] Referring Provider:    Initial Encounter Date: Erby Pian, MD       Pulmonary Rehab from 01/27/2016 in [facility] Cardiac and Pulmonary Rehab   Date  01/27/16      Visit Diagnosis: COPD, moderate ([place])  Patient's Home Medications on Admission:  Current outpatient prescriptions:  .  ALPRAZolam (XANAX) 0.25 MG tablet, Take 0.25 mg by mouth daily as needed for anxiety. , Disp: , Rfl:  .  apixaban (ELIQUIS) 5 MG TABS tablet, Take 1 tablet (5 mg total) by mouth 2 (two) times daily., Disp: 60 tablet, Rfl: 0 .  cholecalciferol (VITAMIN D) 1000 UNITS tablet, Take 2,000 Units by mouth daily. , Disp: , Rfl:  .  cyanocobalamin (,VITAMIN B-12,) 1000 MCG/ML injection, Inject 1,000 mcg into the muscle every 30 (thirty) days. Injection once a month, Disp: , Rfl:  .  docusate sodium (COLACE) 100 MG capsule, Take 100 mg by mouth 2 (two) times daily., Disp: , Rfl:  .  donepezil (ARICEPT) 10 MG tablet, Take 1 tablet (10 mg total) by mouth at bedtime., Disp: 90 tablet, Rfl: 3 .  flecainide (TAMBOCOR) 50 MG tablet, Take 1.5 mg by mouth 2 (two) times daily., Disp: , Rfl:  .  furosemide (LASIX) 20 MG tablet, Take 1 tablet (20 mg total) by mouth daily., Disp: 30 tablet, Rfl: 0 .  gabapentin (NEURONTIN) 300 MG capsule, Take 1,200 mg by mouth 2 (two) times daily., Disp: , Rfl:  .  levalbuterol (XOPENEX HFA) 45 MCG/ACT inhaler, Inhale 2 puffs into the lungs every 4 (four) hours as needed for wheezing., Disp: , Rfl:  .  metoprolol tartrate (LOPRESSOR) 25 MG tablet, Take 12.5 mg by mouth., Disp: , Rfl:  .  pantoprazole (PROTONIX) 40 MG tablet, Take 40 mg by mouth daily. , Disp: , Rfl:  .  tiotropium (SPIRIVA) 18 MCG inhalation capsule, Place 18 mcg into inhaler and inhale daily. , Disp: , Rfl:   Past Medical History: Past Medical History  Diagnosis Date  . Lung cancer ([place])   . COPD (chronic  obstructive pulmonary disease) ([place])   . Cancer ([place])   . Memory deficit   . Celiac disease   . A-fib ([place])     Tobacco Use: History  Smoking status  . Former Smoker -- 1.00 packs/day for 44 years  . Quit date: 11/22/1998  Smokeless tobacco  . Not on file    Labs: Recent Review Flowsheet Data    Labs for ITP Cardiac and Pulmonary Rehab Latest Ref Rng 11/11/2015   Hemoglobin A1c 4.0 - 6.0 % 5.3       ADL UCSD:     Pulmonary Assessment Scores      01/27/16 0900       ADL UCSD   ADL Phase Entry     SOB Score total 10     Rest 0     Walk 0     Stairs 3     Bath 0     Dress 0     Shop 0        Pulmonary Function Assessment:     Pulmonary Function Assessment - 01/27/16 0900    Pulmonary Function Tests   RV% 88 %   DLCO% 58 %   Initial Spirometry Results   FVC% 92 %  Test date2/11/2014   FEV1% 74 %   FEV1/FVC Ratio 61   Post  Bronchodilator Spirometry Results   FVC% 92 %   FEV1% 74 %   FEV1/FVC Ratio 61   Breath   Bilateral Breath Sounds Clear;Decreased  Decreased on right - lobectemy mid and lower sections   Shortness of Breath No      Exercise Target Goals:    Exercise Program Goal: Individual exercise prescription set with THRR, safety & activity barriers. Participant demonstrates ability to understand and report RPE using BORG scale, to self-measure pulse accurately, and to acknowledge the importance of the exercise prescription.  Exercise Prescription Goal: Starting with aerobic activity 30 plus minutes a day, 3 days per week for initial exercise prescription. Provide home exercise prescription and guidelines that participant acknowledges understanding prior to discharge.  Activity Barriers & Risk Stratification:     Activity Barriers & Cardiac Risk Stratification - 01/27/16 0900    Activity Barriers & Cardiac Risk Stratification   Activity Barriers Deconditioning   Cardiac Risk Stratification Low      6 Minute Walk:     6 Minute  Walk      01/27/16 1254       6 Minute Walk   Phase Initial     Distance 855 feet     Walk Time 6 minutes     # of Rest Breaks 0     RPE 13     Perceived Dyspnea  3     Symptoms No     Resting HR 71 bpm     Resting BP 106/64 mmHg     Max Ex. HR 91 bpm     Max Ex. BP 124/72 mmHg        Initial Exercise Prescription:     Initial Exercise Prescription - 01/27/16 1200    Date of Initial Exercise RX and Referring Provider   Date 01/27/16   Treadmill   MPH 1.5   Grade 0   Minutes 10   Recumbant Bike   Level 2   RPM 40   Watts 20   Minutes 10   NuStep   Level 2   Watts 40   Minutes 10   Arm Ergometer   Level 1   Watts 10   Minutes 10   Recumbant Elliptical   Level 1   RPM 40   Watts 20   Minutes 10   REL-XR   Level 2   Watts 40   Minutes 10   T5 Nustep   Level 1   Watts 15   Minutes 10   Biostep-RELP   Level 2   Watts 40   Minutes 10   Prescription Details   Frequency (times per week) 3   Duration Progress to 30 minutes of continuous aerobic without signs/symptoms of physical distress   Intensity   THRR REST +  30   Ratings of Perceived Exertion 11-15   Perceived Dyspnea 0-4   Progression   Progression Continue progressive overload as per policy without signs/symptoms or physical distress.   Resistance Training   Training Prescription Yes   Weight 1   Reps 10-15      Perform Capillary Blood Glucose checks as needed.  Exercise Prescription Changes:     Exercise Prescription Changes      01/30/16 1000 02/02/16 1500 02/04/16 1000 02/06/16 1100 02/09/16 1100   Exercise Review   Progression Yes Yes Yes Yes Yes   Response to Exercise   Blood Pressure (Admit) 134/64 mmHg       Blood Pressure (Exercise) 120/62 mmHg  Blood Pressure (Exit) 118/70 mmHg       Heart Rate (Admit) 75 bpm       Heart Rate (Exercise) 104 bpm       Heart Rate (Exit) 95 bpm       Oxygen Saturation (Admit) 94 %       Oxygen Saturation (Exercise) 92 %       Oxygen  Saturation (Exit) 96 %       Rating of Perceived Exertion (Exercise) 15       Perceived Dyspnea (Exercise) 3       Symptoms None None None None None   Comments First day of exercise! Patient was oriented to the gym and the equipment functions and settings. Procedures and policies of the gym were outlined and explained. The patient's individual exercise prescription and treatment plan were reviewed with them. All starting workloads were established based on the results of the functional testing  done at the initial intake visit. The plan for exercise progression was also introduced and progression will be customized based on the patient's performance and goals.  BX was too difficult for Ms Schubring, so she was changed to the BioStep and did tolerate this goals well. Reviewed individualized exercise prescription and made increases per departmental policy. Exercise increases were discussed with the patient and they were able to perform the new work loads without issue (no signs or symptoms).  Increased time on Biostep machine to further progress patient's stamina. Patient complied with increase and demonstrated ability.     Duration Progress to 30 minutes of continuous aerobic without signs/symptoms of physical distress  Progress to 30 minutes of continuous aerobic without signs/symptoms of physical distress Progress to 30 minutes of continuous aerobic without signs/symptoms of physical distress Progress to 30 minutes of continuous aerobic without signs/symptoms of physical distress   Intensity Rest + 30 Rest + 30 Rest + 30 Rest + 30 Rest + 30   Progression   Progression Continue progressive overload as per policy without signs/symptoms or physical distress. Continue progressive overload as per policy without signs/symptoms or physical distress. Continue progressive overload as per policy without signs/symptoms or physical distress. Continue progressive overload as per policy without signs/symptoms or physical  distress. Continue progressive overload as per policy without signs/symptoms or physical distress.   Resistance Training   Training Prescription Yes Yes Yes Yes Yes   Weight 1 1 1 1 1    Reps 10-15 10-15 10-15 10-15 10-15   Treadmill   MPH 1.5 1.1 1.1 1.1 1.1   Grade 0 0 0 0 0   Minutes 10 10 10 10 10    Recumbant Bike   Level 2       RPM 40       Watts 20       Minutes 10       NuStep   Level 2 2 2 2 3    Watts 40 16 16 16 30    Minutes 10 10 12 12 15    Arm Ergometer   Level 1       Watts 10       Minutes 10       Recumbant Elliptical   Level 1       RPM 40       Watts 20       Minutes 10       REL-XR   Level 2       Watts 40       Minutes 10  T5 Nustep   Level 1       Watts 15       Minutes 10       Biostep-RELP   Level 2 1 1 1 1    Watts 40 15 15 15 15    Minutes 10 10 10 15 15      02/13/16 1000 02/16/16 1000 02/23/16 1100 02/25/16 1000 03/08/16 1300   Exercise Review   Progression Yes Yes Yes Yes    Response to Exercise   Blood Pressure (Admit)     110/60 mmHg   Blood Pressure (Exercise)     120/62 mmHg   Blood Pressure (Exit)     122/70 mmHg   Heart Rate (Admit)     70 bpm   Heart Rate (Exercise)     68 bpm   Heart Rate (Exit)     61 bpm   Oxygen Saturation (Admit)     93 %   Oxygen Saturation (Exercise)     91 %   Oxygen Saturation (Exit)     94 %   Rating of Perceived Exertion (Exercise)     17  Ranged from 13-17   Perceived Dyspnea (Exercise)     5  Ranged from 3-5   Symptoms None None None None None   Comments  Reviewed individualized exercise prescription and made increases per departmental policy. Exercise increases were discussed with the patient and they were able to perform the new work loads without issue (no signs or symptoms).  Reviewed individualized exercise prescription and made increases per departmental policy. Exercise increases were discussed with the patient and they were able to perform the new work loads without issue (no signs or  symptoms).  Reviewed individualized exercise prescription and made increases per departmental policy. Exercise increases were discussed with the patient and they were able to perform the new work loads without issue (no signs or symptoms).     Duration Progress to 30 minutes of continuous aerobic without signs/symptoms of physical distress Progress to 30 minutes of continuous aerobic without signs/symptoms of physical distress Progress to 30 minutes of continuous aerobic without signs/symptoms of physical distress Progress to 30 minutes of continuous aerobic without signs/symptoms of physical distress Progress to 30 minutes of continuous aerobic without signs/symptoms of physical distress   Intensity Rest + 30 Rest + 30 Rest + 30 Rest + 30 Rest + 30   Progression   Progression Continue progressive overload as per policy without signs/symptoms or physical distress. Continue progressive overload as per policy without signs/symptoms or physical distress. Continue progressive overload as per policy without signs/symptoms or physical distress. Continue progressive overload as per policy without signs/symptoms or physical distress. Continue progressive overload as per policy without signs/symptoms or physical distress.   Resistance Training   Training Prescription Yes Yes Yes Yes Yes   Weight 1 1 1 1 1    Reps 10-15 10-15 10-15 10-15 10-15   Treadmill   MPH 1.1 1.1 1.1 1.1 1.1   Grade 0 0 0 0 0   Minutes 11 12 13 13 13    NuStep   Level 3 3 3 3 3    Watts 30 30 30 30 20    Minutes 15 15 15 15 15    Biostep-RELP   Level 1 1 1 1 2    Watts 15 15 15 15 13    Minutes 15 15 15 15 15       Exercise Comments:     Exercise Comments      02/09/16  1121 02/23/16 1148         Exercise Comments Reviewed individualized exercise prescription and made increases per departmental policy. Exercise increases were discussed with the patient and they were able to perform the new work loads without issue (no signs or  symptoms).  Reviewed individualized exercise prescription and made increases per departmental policy. Exercise increases were discussed with the patient and they were able to perform the new work loads without issue (no signs or symptoms).          Discharge Exercise Prescription (Final Exercise Prescription Changes):     Exercise Prescription Changes - 03/08/16 1300    Response to Exercise   Blood Pressure (Admit) 110/60 mmHg   Blood Pressure (Exercise) 120/62 mmHg   Blood Pressure (Exit) 122/70 mmHg   Heart Rate (Admit) 70 bpm   Heart Rate (Exercise) 68 bpm   Heart Rate (Exit) 61 bpm   Oxygen Saturation (Admit) 93 %   Oxygen Saturation (Exercise) 91 %   Oxygen Saturation (Exit) 94 %   Rating of Perceived Exertion (Exercise) 17  Ranged from 13-17   Perceived Dyspnea (Exercise) 5  Ranged from 3-5   Symptoms None   Duration Progress to 30 minutes of continuous aerobic without signs/symptoms of physical distress   Intensity Rest + 30   Progression   Progression Continue progressive overload as per policy without signs/symptoms or physical distress.   Resistance Training   Training Prescription Yes   Weight 1   Reps 10-15   Treadmill   MPH 1.1   Grade 0   Minutes 13   NuStep   Level 3   Watts 20   Minutes 15   Biostep-RELP   Level 2   Watts 13   Minutes 15       Nutrition:  Target Goals: Understanding of nutrition guidelines, daily intake of sodium <1581m, cholesterol <2070m calories 30% from fat and 7% or less from saturated fats, daily to have 5 or more servings of fruits and vegetables.  Biometrics:    Nutrition Therapy Plan and Nutrition Goals:     Nutrition Therapy & Goals - 01/27/16 0900    Nutrition Therapy   Diet Ms KaLinebaughrefers not to meet the dietian; she cooks healthy and eats small portion sizes      Nutrition Discharge: Rate Your Plate Scores:   Psychosocial: Target Goals: Acknowledge presence or absence of depression, maximize coping  skills, provide positive support system. Participant is able to verbalize types and ability to use techniques and skills needed for reducing stress and depression.  Initial Review & Psychosocial Screening:     Initial Psych Review & Screening - 01/27/16 0900    Family Dynamics   Good Support System? Yes   Comments Ms KaHectoras good support from her husband and states she has no depression. She is disappointed with the long recovery from her pneumonia, but is looking forward to monitored exercise in [facility] and learning more aboput COPD.   Barriers   Psychosocial barriers to participate in program There are no identifiable barriers or psychosocial needs.;The patient should benefit from training in stress management and relaxation.   Screening Interventions   Interventions Encouraged to exercise      Quality of Life Scores:     Quality of Life - 01/27/16 0900    Quality of Life Scores   Health/Function Pre 29.25 %   Socioeconomic Pre 30 %   Psych/Spiritual Pre 30 %   Family Pre 30 %  GLOBAL Pre 29.66 %      PHQ-9:     Recent Review Flowsheet Data    Depression screen [facility] 2/9 01/27/2016   Decreased Interest 0   Down, Depressed, Hopeless 0   PHQ - 2 Score 0   Altered sleeping 0   Tired, decreased energy 0   Change in appetite 0   Feeling bad or failure about yourself  0   Trouble concentrating 0   Suicidal thoughts 0   PHQ-9 Score 0   Difficult doing work/chores Not difficult at all      Psychosocial Evaluation and Intervention:     Psychosocial Evaluation - 02/02/16 1207    Psychosocial Evaluation & Interventions   Interventions Encouraged to exercise with the program and follow exercise prescription;Relaxation education;Stress management education   Comments Counselor met with Ms. Dehaan for initial psychosocial evaluation.  She is a 80 year old who came into this program subsequent to a serious bout with pneumonia that lasted over a month.  She has a strong  support system with a spouse of 28 years and several adult children who live closeby.  She also is actively involved in her local church and lives in a retirement community.  Ms. Nelon states that she doesn't sleep great but gets at least 6 hours of sleep per night.  She states her appetite is good and she denies a history of or current symptoms of depression or anxiety.  Ms. Shawn states she is typically in a positive mood and other than her health and adjusting to moving to this area several years ago, she has minimal stress in her life.  Ms. Gambino appeared to have some difficulty with her memory during this evaluation and could not remember the name of the College or city that she lived in while attending there when she was younger.  She admitted problems with memory had been ongoing since the move to this area.  Ms. Rosenstock has goals to improve her breathing, get into shape and just "be stronger" while in this program.  She plans to work out at the [employer] and maybe even swim in the salt water pool there following completion of this program.  Counselor will continue to follow with Ms. Fray throughout this program.       Continued Psychosocial Services Needed Yes  Ms. Armes will benefit from the psychoeducational components of this program, as well as consistent exercise.       Psychosocial Re-Evaluation:     Psychosocial Re-Evaluation      02/18/16 1100           Psychosocial Re-Evaluation   Comments Follow up with Ms. Dundon today reporting having a difficult time completing the workouts due to her "body not cooperating."  She continues to have a positive mood and her memory appeared to be better today sharing places she has lived as a traveling [place] wife and how much she enjoys living in the retirement community.  Counselor will continue to follow with Ms. Amescua while in this program.          Education: Education Goals: Education classes will be provided on a  weekly basis, covering required topics. Participant will state understanding/return demonstration of topics presented.  Learning Barriers/Preferences:     Learning Barriers/Preferences - 01/27/16 0900    Learning Barriers/Preferences   Learning Barriers None   Learning Preferences Group Instruction;Individual Instruction;Pictoral;Skilled Demonstration;Verbal Instruction;Video;Written Material      Education Topics: Initial Evaluation Education: - Verbal,  written and demonstration of respiratory meds, RPE/PD scales, oximetry and breathing techniques. Instruction on use of nebulizers and MDIs: cleaning and proper use, rinsing mouth with steroid doses and importance of monitoring MDI activations.          Pulmonary Rehab from 03/05/2016 in [facility] Cardiac and Pulmonary Rehab   Date  01/27/16   Educator  LB   Instruction Review Code  2- meets goals/outcomes      General Nutrition Guidelines/Fats and Fiber: -Group instruction provided by verbal, written material, models and posters to present the general guidelines for heart healthy nutrition. Gives an explanation and review of dietary fats and fiber.      Pulmonary Rehab from 03/05/2016 in [facility] Cardiac and Pulmonary Rehab   Date  02/23/16   Educator  CR   Instruction Review Code  2- meets goals/outcomes      Controlling Sodium/Reading Food Labels: -Group verbal and written material supporting the discussion of sodium use in heart healthy nutrition. Review and explanation with models, verbal and written materials for utilization of the food label.   Exercise Physiology & Risk Factors: - Group verbal and written instruction with models to review the exercise physiology of the cardiovascular system and associated critical values. Details cardiovascular disease risk factors and the goals associated with each risk factor.   Aerobic Exercise & Resistance Training: - Gives group verbal and written discussion on the health impact of  inactivity. On the components of aerobic and resistive training programs and the benefits of this training and how to safely progress through these programs.   Flexibility, Balance, General Exercise Guidelines: - Provides group verbal and written instruction on the benefits of flexibility and balance training programs. Provides general exercise guidelines with specific guidelines to those with heart or lung disease. Demonstration and skill practice provided.   Stress Management: - Provides group verbal and written instruction about the health risks of elevated stress, cause of high stress, and healthy ways to reduce stress.   Depression: - Provides group verbal and written instruction on the correlation between heart/lung disease and depressed mood, treatment options, and the stigmas associated with seeking treatment.      Pulmonary Rehab from 03/05/2016 in [facility] Cardiac and Pulmonary Rehab   Date  02/25/16   Educator  [facility]   Instruction Review Code  2- meets goals/outcomes      Exercise & Equipment Safety: - Individual verbal instruction and demonstration of equipment use and safety with use of the equipment.      Pulmonary Rehab from 03/05/2016 in [facility] Cardiac and Pulmonary Rehab   Date  01/30/16   Educator  RM   Instruction Review Code  2- meets goals/outcomes      Infection Prevention: - Provides verbal and written material to individual with discussion of infection control including proper hand washing and proper equipment cleaning during exercise session.      Pulmonary Rehab from 03/05/2016 in [facility] Cardiac and Pulmonary Rehab   Date  01/30/16   Educator  RM   Instruction Review Code  2- meets goals/outcomes      Falls Prevention: - Provides verbal and written material to individual with discussion of falls prevention and safety.      Pulmonary Rehab from 03/05/2016 in [facility] Cardiac and Pulmonary Rehab   Date  01/27/16   Educator  LB   Instruction Review Code  2- meets  goals/outcomes      Diabetes: - Individual verbal and written instruction to review signs/symptoms of diabetes, desired  ranges of glucose level fasting, after meals and with exercise. Advice that pre and post exercise glucose checks will be done for 3 sessions at entry of program.   Chronic Lung Diseases: - Group verbal and written instruction to review new updates, new respiratory medications, new advancements in procedures and treatments. Provide informative websites and "800" numbers of self-education.   Lung Procedures: - Group verbal and written instruction to describe testing methods done to diagnose lung disease. Review the outcome of test results. Describe the treatment choices: Pulmonary Function Tests, ABGs and oximetry.   Energy Conservation: - Provide group verbal and written instruction for methods to conserve energy, plan and organize activities. Instruct on pacing techniques, use of adaptive equipment and posture/positioning to relieve shortness of breath.   Triggers: - Group verbal and written instruction to review types of environmental controls: home humidity, furnaces, filters, dust mite/pet prevention, HEPA vacuums. To discuss weather changes, air quality and the benefits of nasal washing.   Exacerbations: - Group verbal and written instruction to provide: warning signs, infection symptoms, calling MD promptly, preventive modes, and value of vaccinations. Review: effective airway clearance, coughing and/or vibration techniques. Create an Sports administrator.      Pulmonary Rehab from 03/05/2016 in [facility] Cardiac and Pulmonary Rehab   Date  02/02/16   Educator  LB   Instruction Review Code  2- meets goals/outcomes      Oxygen: - Individual and group verbal and written instruction on oxygen therapy. Includes supplement oxygen, available portable oxygen systems, continuous and intermittent flow rates, oxygen safety, concentrators, and Medicare reimbursement for  oxygen.   Respiratory Medications: - Group verbal and written instruction to review medications for lung disease. Drug class, frequency, complications, importance of spacers, rinsing mouth after steroid MDI's, and proper cleaning methods for nebulizers.      Pulmonary Rehab from 03/05/2016 in [facility] Cardiac and Pulmonary Rehab   Date  01/27/16   Educator  LB   Instruction Review Code  2- meets goals/outcomes      AED/CPR: - Group verbal and written instruction with the use of models to demonstrate the basic use of the AED with the basic ABC's of resuscitation.   Breathing Retraining: - Provides individuals verbal and written instruction on purpose, frequency, and proper technique of diaphragmatic breathing and pursed-lipped breathing. Applies individual practice skills.      Pulmonary Rehab from 03/05/2016 in [facility] Cardiac and Pulmonary Rehab   Date  01/30/16   Educator  [place]   Instruction Review Code  2- meets goals/outcomes      Anatomy and Physiology of the Lungs: - Group verbal and written instruction with the use of models to provide basic lung anatomy and physiology related to function, structure and complications of lung disease.      Pulmonary Rehab from 03/05/2016 in [facility] Cardiac and Pulmonary Rehab   Date  02/13/16   Educator  Erline Levine RRT   Instruction Review Code  2- meets goals/outcomes      Heart Failure: - Group verbal and written instruction on the basics of heart failure: signs/symptoms, treatments, explanation of ejection fraction, enlarged heart and cardiomyopathy.   Sleep Apnea: - Individual verbal and written instruction to review Obstructive Sleep Apnea. Review of risk factors, methods for diagnosing and types of masks and machines for OSA.   Anxiety: - Provides group, verbal and written instruction on the correlation between heart/lung disease and anxiety, treatment options, and management of anxiety.   Relaxation: - Provides group, verbal and written  instruction about the benefits of relaxation for patients with heart/lung disease. Also provides patients with examples of relaxation techniques.   Knowledge Questionnaire Score:     Knowledge Questionnaire Score - 01/27/16 0900    Knowledge Questionnaire Score   Pre Score 6/10       Core Components/Risk Factors/Patient Goals at Admission:     Personal Goals and Risk Factors at Admission - 01/27/16 0900    Core Components/Risk Factors/Patient Goals on Admission   Sedentary Yes   Intervention Provide advice, education, support and counseling about physical activity/exercise needs.;Develop an individualized exercise prescription for aerobic and resistive training based on initial evaluation findings, risk stratification, comorbidities and participant's personal goals.  [name] had pneumonia in December and has a long recovery period. She previously exercised at the [facility] and is looking forward to return to her exercise at their facility.   Expected Outcomes Achievement of increased cardiorespiratory fitness and enhanced flexibility, muscular endurance and strength shown through measurements of functional capaciy and personal statement of participant.   Improve shortness of breath with ADL's Yes   Intervention Provide education, individualized exercise plan and daily activity instruction to help decrease symptoms of SOB with activities of daily living.  Ms threats rates stairs and hills a "3" on shortness of breath questionnaire.   Expected Outcomes Short Term: Achieves a reduction of symptoms when performing activities of daily living.   Develop more efficient breathing techniques such as purse lipped breathing and diaphragmatic breathing; and practicing self-pacing with activity Yes   Intervention Provide education, demonstration and support about specific breathing techniuqes utilized for more efficient breathing. Include techniques such as pursed lipped breathing,  diaphragmatic breathing and self-pacing activity.   Expected Outcomes Short Term: Participant will be able to demonstrate and use breathing techniques as needed throughout daily activities.   Increase knowledge of respiratory medications and ability to use respiratory devices properly  Yes   Intervention Provide education and demonstration as needed of appropriate use of medications, inhalers, and oxygen therapy.  Ms Larkin takes Spiriva and wears 2l/m [place] at night with sleep. She is being tested this week to see if she still needs the oxygen at night.   Expected Outcomes Short Term: Achieves understanding of medications use. Understands that oxygen is a medication prescribed by physician. Demonstrates appropriate use of inhaler and oxygen therapy.      Core Components/Risk Factors/Patient Goals Review:      Goals and Risk Factor Review      01/30/16 1243 02/04/16 1509 02/06/16 1343 02/06/16 1348 02/23/16 1000   Core Components/Risk Factors/Patient Goals Review   Personal Goals Review Develop more efficient breathing techniques such as purse lipped breathing and diaphragmatic breathing and practicing self-pacing with activity. Sedentary Improve shortness of breath with ADL's Increase knowledge of respiratory medications and ability to use respiratory devices properly. Improve shortness of breath with ADL's;Develop more efficient breathing techniques such as purse lipped breathing and diaphragmatic breathing and practicing self-pacing with activity.   Review Reviewed PLB with Ms. Witthuhn today. Ms Ken has increased her time with her exercise goals on the NS and BioStep. She has become more knowledgeable in setting up the equipment and does want to progress with her exercise goals. Ms Hornbaker does want to continue exercising at the [place] gym with her husband. Ms. Burkemper says that walking causes her to have the most SOB.  Reviewed PLB with her and talked about taking rest periods while  walking and shopping. Ms.  Meaders says that she is comfortable with the respiratory medications she is taking.  She has no questions at this time and does not need a spcer with the medicines that she is currently taking. Ms Upton states she is feeling so much better - she is breathing better with activites at home and is walking faster. She is looking forward to getting back to [place] and exercise again with her husband.   Expected Outcomes She will use this technique while exercising to be able to exercise for longer periods of time. Continue improving her strength and stamina to add one extra day at the [facility]. Increase in distance she can walk and being able to shop longer. Taking her medications as prescribed should help decreased her SOB.      02/27/16 1238           Core Components/Risk Factors/Patient Goals Review   Personal Goals Review Increase knowledge of respiratory medications and ability to use respiratory devices properly.       Review Ms. Harter takes Spiriva for her breathing at home.  She understands how to use the device and when she is to take her medication.       Expected Outcomes Taking her medication as prescribed will help decrease her SOB.          Core Components/Risk Factors/Patient Goals at Discharge (Final Review):      Goals and Risk Factor Review - 02/27/16 1238    Core Components/Risk Factors/Patient Goals Review   Personal Goals Review Increase knowledge of respiratory medications and ability to use respiratory devices properly.   Review Ms. Peaster takes Spiriva for her breathing at home.  She understands how to use the device and when she is to take her medication.   Expected Outcomes Taking her medication as prescribed will help decrease her SOB.      ITP Comments:     ITP Comments      01/30/16 1044           ITP Comments Personal and exercise goals expected to be met in 34 more sessions. Progress on specific  individualized goals will be charted in patient's ITP. Upon completion of the program the patient will be comfortable managing exercise goals and progression on their own.           Comments: 30 Day Review

## 2016-03-10 ENCOUNTER — Encounter: Payer: Medicare Other | Admitting: *Deleted

## 2016-03-10 DIAGNOSIS — J449 Chronic obstructive pulmonary disease, unspecified: Secondary | ICD-10-CM | POA: Diagnosis not present

## 2016-03-10 NOTE — Progress Notes (Signed)
Daily Session Note  Patient Details  Name: Cheryl Cannon MRN: [id number] Date of Birth: 02/11/1936 Referring Provider:    Encounter Date: 03/10/2016  Check In:     Session Check In - 03/10/16 1105    Check-In   Staff Present Heath Lark, RN, BSN, CCRP;Laureen Owens Shark, BS, RRT, Respiratory Therapist;Rebecca Brayton El, DPT, [place] physician immediately available to respond to emergencies [facility] immediately available ER MD   Physician(s) Drs: Dineen Kid and Edd Fabian   Medication changes reported     No   Fall or balance concerns reported    No   Warm-up and Cool-down Performed on first and last piece of equipment   VAD Patient? No   Pain Assessment   Currently in Pain? No/denies           Exercise Prescription Changes - 03/10/16 1100    Response to Exercise   Rating of Perceived Exertion (Exercise) --  Ranged from 13-17   Perceived Dyspnea (Exercise) --  Ranged from 3-5   Duration Progress to 30 minutes of continuous aerobic without signs/symptoms of physical distress   Intensity Rest + 30   Progression   Progression Continue progressive overload as per policy without signs/symptoms or physical distress.   Resistance Training   Training Prescription Yes   Weight 1   Reps 10-15   Treadmill   MPH 1.1   Grade 0   Minutes 13   NuStep   Level 3   Watts 20   Minutes 15   Biostep-RELP   Level 3   Watts 13   Minutes 15      Goals Met:  Exercise tolerated well Personal goals reviewed Strength training completed today  Goals Unmet:  Not Applicable  Comments: Doing well with exercise prescription progression.    Dr. Emily Filbert is Medical Director for [place] and [facility].

## 2016-03-12 ENCOUNTER — Encounter: Payer: Medicare Other | Admitting: *Deleted

## 2016-03-12 DIAGNOSIS — J449 Chronic obstructive pulmonary disease, unspecified: Secondary | ICD-10-CM | POA: Diagnosis not present

## 2016-03-12 NOTE — Progress Notes (Signed)
Daily Session Note  Patient Details  Name: Cheryl Cannon MRN: [id number] Date of Birth: February 13, 1936 Referring Provider:    Encounter Date: 03/12/2016  Check In:     Session Check In - 03/12/16 1150    Check-In   Location [facility]-Cardiac & Pulmonary Rehab   Staff Present Heath Lark, RN, BSN, CCRP;Mieko Kneebone, RN, BSN   Supervising physician immediately available to respond to emergencies [facility] immediately available ER MD   Physician(s) Diannia Ruder and Paduchowski   Medication changes reported     No   Fall or balance concerns reported    No   Warm-up and Cool-down Performed on first and last piece of equipment   Resistance Training Performed Yes   Pain Assessment   Currently in Pain? No/denies         Goals Met:  Proper associated with RPD/PD & O2 Sat Exercise tolerated well  Goals Unmet:  Not Applicable  Comments:     Dr. Emily Filbert is Medical Director for [place] and [facility].

## 2016-03-15 ENCOUNTER — Encounter: Payer: Medicare Other | Admitting: *Deleted

## 2016-03-15 DIAGNOSIS — J449 Chronic obstructive pulmonary disease, unspecified: Secondary | ICD-10-CM

## 2016-03-15 NOTE — Progress Notes (Signed)
Daily Session Note  Patient Details  Name: COURTENY EGLER MRN: [id number] Date of Birth: [birth year] Referring Provider:    Encounter Date: 03/15/2016  Check In:     Session Check In - 03/15/16 1246    Check-In   Location [facility]-Cardiac & Pulmonary Rehab   Staff Present Earlean Shawl, BS, ACSM CEP, Exercise Physiologist;Rebecca Brayton El, DPT, CEEA;Heath Lark, RN, BSN, CCRP;Laureen Owens Shark, BS, RRT, Respiratory Therapist   Supervising physician immediately available to respond to emergencies [facility] immediately available ER MD   Physician(s) Clearnce Hasten and McShane   Medication changes reported     No   Fall or balance concerns reported    No   Warm-up and Cool-down Performed on first and last piece of equipment   Resistance Training Performed Yes   VAD Patient? No   Pain Assessment   Currently in Pain? No/denies   Multiple Pain Sites No         Goals Met:  Proper associated with RPD/PD & O2 Sat Independence with exercise equipment Exercise tolerated well Strength training completed today  Goals Unmet:  Not Applicable  Comments: Patient completed exercise prescription and all exercise goals during rehab session. The exercise was tolerated well and the patient is progressing in the program.     Dr. Emily Filbert is Medical Director for [place] and [facility].

## 2016-03-17 ENCOUNTER — Encounter: Payer: Medicare Other | Admitting: *Deleted

## 2016-03-17 DIAGNOSIS — J449 Chronic obstructive pulmonary disease, unspecified: Secondary | ICD-10-CM | POA: Diagnosis not present

## 2016-03-17 NOTE — Progress Notes (Signed)
Daily Session Note  Patient Details  Name: Cheryl Cannon MRN: [id number] Date of Birth: 09/12/1936 Referring Provider:    Encounter Date: 03/17/2016  Check In:     Session Check In - 03/17/16 1155    Check-In   Staff Present Heath Lark, RN, BSN, CCRP;Laureen Owens Shark, BS, RRT, Respiratory Therapist;Rebecca Brayton El, DPT, [place] physician immediately available to respond to emergencies [facility] immediately available ER MD   Physician(s) Drs: Cinda Quest and Joni Fears   Medication changes reported     No   Fall or balance concerns reported    No   Warm-up and Cool-down Performed on first and last piece of equipment   Pain Assessment   Currently in Pain? No/denies         Goals Met:  Exercise tolerated well Queuing for purse lip breathing Strength training completed today  Goals Unmet:  Not Applicable  Comments: Doing well with exercise prescription progression. Reminded [name] to use her purselipped breathing while walking on the treadmill.   Dr. Emily Filbert is Medical Director for [place] and [facility].

## 2016-03-22 ENCOUNTER — Encounter: Payer: Medicare Other | Attending: Specialist | Admitting: *Deleted

## 2016-03-22 DIAGNOSIS — J449 Chronic obstructive pulmonary disease, unspecified: Secondary | ICD-10-CM | POA: Diagnosis present

## 2016-03-22 NOTE — Progress Notes (Signed)
Daily Session Note  Patient Details  Name: Cheryl Cannon MRN: [id number] Date of Birth: [birth year] Referring Provider:    Encounter Date: 03/22/2016  Check In:     Session Check In - 03/22/16 1217    Check-In   Location [facility]-Cardiac & Pulmonary Rehab   Staff Present Earlean Shawl, BS, ACSM CEP, Exercise Physiologist;Laureen Owens Shark, BS, RRT, Respiratory Therapist;Carroll Enterkin, RN, BSN   Supervising physician immediately available to respond to emergencies [facility] immediately available ER MD   Physician(s) Joni Fears and Burlene Arnt   Medication changes reported     No   Fall or balance concerns reported    No   Warm-up and Cool-down Performed on first and last piece of equipment   Resistance Training Performed Yes   VAD Patient? No   Pain Assessment   Currently in Pain? No/denies   Multiple Pain Sites No         Goals Met:  Proper associated with RPD/PD & O2 Sat Independence with exercise equipment Exercise tolerated well No report of cardiac concerns or symptoms  Goals Unmet:  Not Applicable  Comments: Patient completed exercise prescription and all exercise goals during rehab session. The exercise was tolerated well and the patient is progressing in the program.     Dr. Emily Filbert is Medical Director for [place] and [facility].

## 2016-03-24 ENCOUNTER — Encounter: Payer: Medicare Other | Admitting: *Deleted

## 2016-03-24 DIAGNOSIS — J449 Chronic obstructive pulmonary disease, unspecified: Secondary | ICD-10-CM

## 2016-03-24 NOTE — Progress Notes (Signed)
Daily Session Note  Patient Details  Name: Cheryl Cannon MRN: [id number] Date of Birth: 11/16/1936 Referring Provider:    Encounter Date: 03/24/2016  Check In:     Session Check In - 03/24/16 1235    Check-In   Staff Present  , RN, BSN, CCRP;Laureen Brown, BS, RRT, Respiratory Therapist;Diane Wright, RN, BSN   Supervising physician immediately available to respond to emergencies [facility] immediately available ER MD   Physician(s) Drs: Williams and Lord   Medication changes reported     No   Fall or balance concerns reported    No   Warm-up and Cool-down Performed on first and last piece of equipment   VAD Patient? No   Pain Assessment   Currently in Pain? No/denies         Goals Met:  Exercise tolerated well Queuing for purse lip breathing Strength training completed today  Goals Unmet:  Not Applicable  Comments: Doing well with exercise prescription progression.    Dr. Mark Miller is Medical Director for [facility] and [facility]. 

## 2016-03-29 ENCOUNTER — Encounter: Payer: Medicare Other | Admitting: *Deleted

## 2016-03-29 DIAGNOSIS — J449 Chronic obstructive pulmonary disease, unspecified: Secondary | ICD-10-CM

## 2016-03-29 NOTE — Progress Notes (Signed)
Daily Session Note  Patient Details  Name: Cheryl Cannon MRN: [id number] Date of Birth: 1936/05/15 Referring Provider:    Encounter Date: 03/29/2016  Check In:     Session Check In - 03/29/16 1146    Check-In   Location [facility]-Cardiac & Pulmonary Rehab   Staff Present Carson Myrtle, BS, RRT, Respiratory Therapist;Merrik Puebla, RN, Moises Blood, BS, ACSM CEP, Exercise Physiologist   Supervising physician immediately available to respond to emergencies [facility] immediately available ER MD   Physician(s) Dr. Reita Cliche and Dr. Claudette Head   Medication changes reported     No   Fall or balance concerns reported    No   Warm-up and Cool-down Performed on first and last piece of equipment   Resistance Training Performed No   VAD Patient? No   Pain Assessment   Currently in Pain? No/denies         Goals Met:  Proper associated with RPD/PD & O2 Sat Exercise tolerated well  Goals Unmet:  Not Applicable  Comments:     Dr. Emily Filbert is Medical Director for [place] and [facility].

## 2016-03-31 ENCOUNTER — Encounter: Payer: Medicare Other | Admitting: *Deleted

## 2016-03-31 DIAGNOSIS — J449 Chronic obstructive pulmonary disease, unspecified: Secondary | ICD-10-CM

## 2016-03-31 NOTE — Progress Notes (Signed)
Daily Session Note  Patient Details  Name: Cheryl Cannon MRN: [id number] Date of Birth: [birth year] Referring Provider:    Encounter Date: 03/31/2016  Check In:     Session Check In - 03/31/16 1128    Check-In   Staff Present Heath Lark, RN, BSN, CCRP;Carroll Enterkin, RN, BSN;Laureen Owens Shark, BS, RRT, Respiratory Therapist   Supervising physician immediately available to respond to emergencies [facility] immediately available ER MD   Physician(s) Drs: Lovena Le and Jimmye Norman   Medication changes reported     No   Fall or balance concerns reported    No   Warm-up and Cool-down Performed on first and last piece of equipment   VAD Patient? No   Pain Assessment   Currently in Pain? No/denies           Exercise Prescription Changes - 03/31/16 1100    Exercise Review   Progression Yes   Response to Exercise   Duration Progress to 50 minutes of aerobic without signs/symptoms of physical distress   Intensity Rest + 30   Progression   Progression Continue to progress workloads to maintain intensity without signs/symptoms of physical distress.   Resistance Training   Training Prescription Yes   Weight 1   Reps 10-12   Treadmill   MPH 1.2   Grade 0   Minutes 15   NuStep   Level 4   Watts 20   Minutes 15   Biostep-RELP   Level 3   Watts 15   Minutes 15      Goals Met:  Exercise tolerated well Personal goals reviewed Strength training completed today  Goals Unmet:  Not Applicable  Comments: Doing well with exercise prescription progression.    Dr. Emily Filbert is Medical Director for [place] and [facility].

## 2016-04-02 ENCOUNTER — Encounter: Payer: Medicare Other | Admitting: *Deleted

## 2016-04-02 DIAGNOSIS — J449 Chronic obstructive pulmonary disease, unspecified: Secondary | ICD-10-CM | POA: Diagnosis not present

## 2016-04-02 NOTE — Progress Notes (Signed)
Daily Session Note  Patient Details  Name: MYLEKA MONCURE MRN: [id number] Date of Birth: 09-12-1936 Referring Provider:    Encounter Date: 04/02/2016  Check In:     Session Check In - 04/02/16 1105    Check-In   Location [facility]-Cardiac & Pulmonary Rehab   Staff Present Heath Lark, RN, BSN, CCRP;Carroll Enterkin, RN, Moises Blood, BS, ACSM CEP, Exercise Physiologist   Supervising physician immediately available to respond to emergencies [facility] immediately available ER MD   Physician(s) Drs; Marcelene Butte and [facility]   Medication changes reported     No   Fall or balance concerns reported    No   Warm-up and Cool-down Performed on first and last piece of equipment   VAD Patient? No   Pain Assessment   Currently in Pain? No/denies         Goals Met:  Proper associated with RPD/PD & O2 Sat Exercise tolerated well Strength training completed today  Goals Unmet:  Not Applicable  Comments: Doing well with exercise prescription progression.    Dr. Emily Filbert is Medical Director for [place] and [facility].

## 2016-04-03 ENCOUNTER — Other Ambulatory Visit: Payer: Self-pay | Admitting: Neurology

## 2016-04-05 DIAGNOSIS — J449 Chronic obstructive pulmonary disease, unspecified: Secondary | ICD-10-CM

## 2016-04-05 NOTE — Progress Notes (Signed)
Daily Session Note  Patient Details  Name: Cheryl Cannon MRN: [id number] Date of Birth: [birth year] Referring Provider:    Encounter Date: 04/05/2016  Check In:     Session Check In - 04/05/16 1123    Check-In   Location [facility]-Cardiac & Pulmonary Rehab   Staff Present Gerlene Burdock, RN, Levie Heritage, MA, ACSM RCEP, Exercise Physiologist;Kelly Amedeo Plenty, BS, ACSM CEP, Exercise Physiologist;Kaige Whistler Oletta Darter, BA, ACSM CEP, Exercise Physiologist;Laureen Owens Shark, BS, RRT, Respiratory Therapist   Supervising physician immediately available to respond to emergencies [facility] immediately available ER MD   Physician(s) Reita Cliche and Corky Downs   Medication changes reported     No   Fall or balance concerns reported    No   Warm-up and Cool-down Performed on first and last piece of equipment   Resistance Training Performed Yes   VAD Patient? No   Pain Assessment   Currently in Pain? No/denies   Multiple Pain Sites No         Goals Met:  Proper associated with RPD/PD & O2 Sat Independence with exercise equipment Exercise tolerated well Strength training completed today  Goals Unmet:  Not Applicable  Comments:    Dr. Emily Filbert is Medical Director for [place] and [facility].

## 2016-04-05 NOTE — Addendum Note (Signed)
Addended by: Earlean Shawl T on: 04/05/2016 01:38 PM   Modules accepted: Orders

## 2016-04-05 NOTE — Progress Notes (Signed)
Pulmonary Individual Treatment Plan  Patient Details  Name: CARISMA TROUPE MRN: [id number] Date of Birth: 1936-01-03 Referring Provider:Herbon London Pepper, MD     Initial Encounter Date:       Pulmonary Rehab from 01/27/2016 in [facility] Cardiac and Pulmonary Rehab   Date  01/27/16      Visit Diagnosis: COPD, moderate ([place])  Patient's Home Medications on Admission:  Current outpatient prescriptions:  .  ALPRAZolam (XANAX) 0.25 MG tablet, Take 0.25 mg by mouth daily as needed for anxiety. , Disp: , Rfl:  .  apixaban (ELIQUIS) 5 MG TABS tablet, Take 1 tablet (5 mg total) by mouth 2 (two) times daily., Disp: 60 tablet, Rfl: 0 .  cholecalciferol (VITAMIN D) 1000 UNITS tablet, Take 2,000 Units by mouth daily. , Disp: , Rfl:  .  cyanocobalamin (,VITAMIN B-12,) 1000 MCG/ML injection, Inject 1,000 mcg into the muscle every 30 (thirty) days. Injection once a month, Disp: , Rfl:  .  docusate sodium (COLACE) 100 MG capsule, Take 100 mg by mouth 2 (two) times daily., Disp: , Rfl:  .  donepezil (ARICEPT) 10 MG tablet, Take 1 tablet (10 mg total) by mouth at bedtime., Disp: 90 tablet, Rfl: 3 .  flecainide (TAMBOCOR) 50 MG tablet, Take 1.5 mg by mouth 2 (two) times daily., Disp: , Rfl:  .  furosemide (LASIX) 20 MG tablet, Take 1 tablet (20 mg total) by mouth daily., Disp: 30 tablet, Rfl: 0 .  gabapentin (NEURONTIN) 300 MG capsule, TAKE 4 CAPSULES IN THE MORNING AND 4 CAPSULES IN THE EVENING, Disp: 720 capsule, Rfl: 1 .  gabapentin (NEURONTIN) 300 MG capsule, Take 1,200 mg by mouth 2 (two) times daily., Disp: , Rfl:  .  levalbuterol (XOPENEX HFA) 45 MCG/ACT inhaler, Inhale 2 puffs into the lungs every 4 (four) hours as needed for wheezing., Disp: , Rfl:  .  metoprolol tartrate (LOPRESSOR) 25 MG tablet, Take 12.5 mg by mouth., Disp: , Rfl:  .  pantoprazole (PROTONIX) 40 MG tablet, Take 40 mg by mouth daily. , Disp: , Rfl:  .  tiotropium (SPIRIVA) 18 MCG inhalation capsule, Place 18 mcg into inhaler and inhale  daily. , Disp: , Rfl:   Past Medical History: Past Medical History  Diagnosis Date  . Lung cancer ([place])   . COPD (chronic obstructive pulmonary disease) ([place])   . Cancer ([place])   . Memory deficit   . Celiac disease   . A-fib ([place])     Tobacco Use: History  Smoking status  . Former Smoker -- 1.00 packs/day for 44 years  . Quit date: 11/22/1998  Smokeless tobacco  . Not on file    Labs: Recent Review Flowsheet Data    Labs for ITP Cardiac and Pulmonary Rehab Latest Ref Rng 11/11/2015   Hemoglobin A1c 4.0 - 6.0 % 5.3       ADL UCSD:     Pulmonary Assessment Scores      01/27/16 0900       ADL UCSD   ADL Phase Entry     SOB Score total 10     Rest 0     Walk 0     Stairs 3     Bath 0     Dress 0     Shop 0        Pulmonary Function Assessment:     Pulmonary Function Assessment - 01/27/16 0900    Pulmonary Function Tests   RV% 88 %   DLCO% 58 %  Initial Spirometry Results   FVC% 92 %  Test date2/11/2014   FEV1% 74 %   FEV1/FVC Ratio 61   Post Bronchodilator Spirometry Results   FVC% 92 %   FEV1% 74 %   FEV1/FVC Ratio 61   Breath   Bilateral Breath Sounds Clear;Decreased  Decreased on right - lobectemy mid and lower sections   Shortness of Breath No      Exercise Target Goals:    Exercise Program Goal: Individual exercise prescription set with THRR, safety & activity barriers. Participant demonstrates ability to understand and report RPE using BORG scale, to self-measure pulse accurately, and to acknowledge the importance of the exercise prescription.  Exercise Prescription Goal: Starting with aerobic activity 30 plus minutes a day, 3 days per week for initial exercise prescription. Provide home exercise prescription and guidelines that participant acknowledges understanding prior to discharge.  Activity Barriers & Risk Stratification:     Activity Barriers & Cardiac Risk Stratification - 01/27/16 0900    Activity Barriers & Cardiac Risk  Stratification   Activity Barriers Deconditioning   Cardiac Risk Stratification Low      6 Minute Walk:     6 Minute Walk      01/27/16 1254       6 Minute Walk   Phase Initial     Distance 855 feet     Walk Time 6 minutes     # of Rest Breaks 0     RPE 13     Perceived Dyspnea  3     Symptoms No     Resting HR 71 bpm     Resting BP 106/64 mmHg     Max Ex. HR 91 bpm     Max Ex. BP 124/72 mmHg        Initial Exercise Prescription:     Initial Exercise Prescription - 01/27/16 1200    Date of Initial Exercise RX and Referring Provider   Date 01/27/16   Treadmill   MPH 1.5   Grade 0   Minutes 10   Recumbant Bike   Level 2   RPM 40   Watts 20   Minutes 10   NuStep   Level 2   Watts 40   Minutes 10   Arm Ergometer   Level 1   Watts 10   Minutes 10   Recumbant Elliptical   Level 1   RPM 40   Watts 20   Minutes 10   REL-XR   Level 2   Watts 40   Minutes 10   T5 Nustep   Level 1   Watts 15   Minutes 10   Biostep-RELP   Level 2   Watts 40   Minutes 10   Prescription Details   Frequency (times per week) 3   Duration Progress to 30 minutes of continuous aerobic without signs/symptoms of physical distress   Intensity   THRR REST +  30   Ratings of Perceived Exertion 11-15   Perceived Dyspnea 0-4   Progression   Progression Continue progressive overload as per policy without signs/symptoms or physical distress.   Resistance Training   Training Prescription Yes   Weight 1   Reps 10-15      Perform Capillary Blood Glucose checks as needed.  Exercise Prescription Changes:     Exercise Prescription Changes      01/30/16 1000 02/02/16 1500 02/04/16 1000 02/06/16 1100 02/09/16 1100   Exercise Review   Progression Yes Yes Yes Yes  Yes   Response to Exercise   Blood Pressure (Admit) 134/64 mmHg       Blood Pressure (Exercise) 120/62 mmHg       Blood Pressure (Exit) 118/70 mmHg       Heart Rate (Admit) 75 bpm       Heart Rate (Exercise) 104  bpm       Heart Rate (Exit) 95 bpm       Oxygen Saturation (Admit) 94 %       Oxygen Saturation (Exercise) 92 %       Oxygen Saturation (Exit) 96 %       Rating of Perceived Exertion (Exercise) 15       Perceived Dyspnea (Exercise) 3       Symptoms _0    Comments First day of exercise! Patient was oriented to the gym and the equipment functions and settings. Procedures and policies of the gym were outlined and explained. The patient's individual exercise prescription and treatment plan were reviewed with them. All starting workloads were established based on the results of the functional testing  done at the initial intake visit. The plan for exercise progression was also introduced and progression will be customized based on the patient's performance and goals.  BX was too difficult for Ms Nicolaou, so she was changed to the BioStep and did tolerate this goals well. Reviewed individualized exercise prescription and made increases per departmental policy. Exercise increases were discussed with the patient and they were able to perform the new work loads without issue (no signs or symptoms).  Increased time on Biostep machine to further progress patient's stamina. Patient complied with increase and demonstrated ability.     Duration Progress to 30 minutes of continuous aerobic without signs/symptoms of physical distress  Progress to 30 minutes of continuous aerobic without signs/symptoms of physical distress Progress to 30 minutes of continuous aerobic without signs/symptoms of physical distress Progress to 30 minutes of continuous aerobic without signs/symptoms of physical distress   Intensity Rest + 30 Rest + 30 Rest + 30 Rest + 30 Rest + 30   Progression   Progression Continue progressive overload as per policy without signs/symptoms or physical distress. Continue progressive overload as per policy without signs/symptoms or physical distress. Continue progressive overload as per  policy without signs/symptoms or physical distress. Continue progressive overload as per policy without signs/symptoms or physical distress. Continue progressive overload as per policy without signs/symptoms or physical distress.   Resistance Training   Training Prescription _1    Weight _2 Reps 10-15 10-15 10-15 10-15 10-15   Treadmill   MPH 1.5 1.1 1.1 1.1 1.1   Grade 0 0 0 0 0   Minutes _3 Recumbant Bike   Level 2       RPM 40       Watts 20       Minutes 10       NuStep   Level _4 Watts 40 _5 Minutes _6 Arm Ergometer   Level 1       Watts 10       Minutes 10       Recumbant Elliptical   Level 1       RPM 40       Watts 20  Minutes 10       REL-XR   Level 2       Watts 40       Minutes 10       T5 Nustep   Level 1       Watts 15       Minutes 10       Biostep-RELP   Level _0 Watts 40 _1 Minutes _2 02/13/16 1000 02/16/16 1000 02/23/16 1100 02/25/16 1000 03/08/16 1300   Exercise Review   Progression Yes Yes Yes Yes    Response to Exercise   Blood Pressure (Admit)     110/60 mmHg   Blood Pressure (Exercise)     120/62 mmHg   Blood Pressure (Exit)     122/70 mmHg   Heart Rate (Admit)     70 bpm   Heart Rate (Exercise)     68 bpm   Heart Rate (Exit)     61 bpm   Oxygen Saturation (Admit)     93 %   Oxygen Saturation (Exercise)     91 %   Oxygen Saturation (Exit)     94 %   Rating of Perceived Exertion (Exercise)     17  Ranged from 13-17   Perceived Dyspnea (Exercise)     5  Ranged from 3-5   Symptoms _3    Comments  Reviewed individualized exercise prescription and made increases per departmental policy. Exercise increases were discussed with the patient and they were able to perform the new work loads without issue (no signs or symptoms).  Reviewed individualized exercise prescription and made increases per departmental policy.  Exercise increases were discussed with the patient and they were able to perform the new work loads without issue (no signs or symptoms).  Reviewed individualized exercise prescription and made increases per departmental policy. Exercise increases were discussed with the patient and they were able to perform the new work loads without issue (no signs or symptoms).     Duration Progress to 30 minutes of continuous aerobic without signs/symptoms of physical distress Progress to 30 minutes of continuous aerobic without signs/symptoms of physical distress Progress to 30 minutes of continuous aerobic without signs/symptoms of physical distress Progress to 30 minutes of continuous aerobic without signs/symptoms of physical distress Progress to 30 minutes of continuous aerobic without signs/symptoms of physical distress   Intensity Rest + 30 Rest + 30 Rest + 30 Rest + 30 Rest + 30   Progression   Progression Continue progressive overload as per policy without signs/symptoms or physical distress. Continue progressive overload as per policy without signs/symptoms or physical distress. Continue progressive overload as per policy without signs/symptoms or physical distress. Continue progressive overload as per policy without signs/symptoms or physical distress. Continue progressive overload as per policy without signs/symptoms or physical distress.   Resistance Training   Training Prescription _4    Weight _5 Reps 10-15 10-15 10-15 10-15 10-15   Treadmill   MPH 1.1 1.1 1.1 1.1 1.1   Grade 0 0 0 0 0   Minutes _6 NuStep   Level _7 Watts _8 Minutes _9 Biostep-RELP   Level _10 2  Watts _0 Minutes _1 03/10/16 1100 03/12/16 1100 03/22/16 1600 03/31/16 1100     Exercise Review   Progression   Yes Yes    Response to Exercise   Blood Pressure (Admit)   102/64 mmHg     Blood Pressure (Exercise)    104/62 mmHg     Blood Pressure (Exit)   102/62 mmHg     Heart Rate (Admit)   93 bpm     Heart Rate (Exercise)   88 bpm     Heart Rate (Exit)   93 bpm     Oxygen Saturation (Admit)   95 %     Oxygen Saturation (Exercise)   93 %     Oxygen Saturation (Exit)   93 %     Rating of Perceived Exertion (Exercise) --  Ranged from 13-17 --  Ranged from 13-17 15     Perceived Dyspnea (Exercise) --  Ranged from 3-5 --  Ranged from 3-5 3     Symptoms   None     Duration Progress to 30 minutes of continuous aerobic without signs/symptoms of physical distress Progress to 30 minutes of continuous aerobic without signs/symptoms of physical distress Progress to 50 minutes of aerobic without signs/symptoms of physical distress Progress to 50 minutes of aerobic without signs/symptoms of physical distress    Intensity Rest + 30 Rest + 30 Rest + 30 Rest + 30    Progression   Progression Continue progressive overload as per policy without signs/symptoms or physical distress. Continue progressive overload as per policy without signs/symptoms or physical distress. Continue to progress workloads to maintain intensity without signs/symptoms of physical distress. Continue to progress workloads to maintain intensity without signs/symptoms of physical distress.    Resistance Training   Training Prescription Yes Yes Yes Yes    Weight _2 Reps 10-15 10-15 10-12 10-12    Treadmill   MPH 1.1 1.1 1.1 1.2    Grade 0 0 0 0    Minutes _3 NuStep   Level _4 Watts _5 Minutes _6 Biostep-RELP   Level _7 Watts _8 Minutes _9 Exercise Comments:     Exercise Comments      02/09/16 1121 02/23/16 1148 03/31/16 1124       Exercise Comments Reviewed individualized exercise prescription and made increases per departmental policy. Exercise increases were discussed with the patient and they were able to perform the new work loads without  issue (no signs or symptoms).  Reviewed individualized exercise prescription and made increases per departmental policy. Exercise increases were discussed with the patient and they were able to perform the new work loads without issue (no signs or symptoms).  Shaeley stated that she feels stronger and has more energy as she attends the [employer]. She increased her workload on the treadmill today and was "dancing to the music". When I told her "No dancing on the treadmill" She responded with"I feel like dancing, I am so much better since I started this exercise."        Discharge Exercise Prescription (Final Exercise Prescription Changes):     Exercise Prescription Changes - 03/31/16  1100    Exercise Review   Progression Yes   Response to Exercise   Duration Progress to 50 minutes of aerobic without signs/symptoms of physical distress   Intensity Rest + 30   Progression   Progression Continue to progress workloads to maintain intensity without signs/symptoms of physical distress.   Resistance Training   Training Prescription Yes   Weight 1   Reps 10-12   Treadmill   MPH 1.2   Grade 0   Minutes 15   NuStep   Level 4   Watts 20   Minutes 15   Biostep-RELP   Level 3   Watts 15   Minutes 15       Nutrition:  Target Goals: Understanding of nutrition guidelines, daily intake of sodium '1500mg'$ , cholesterol '200mg'$ , calories 30% from fat and 7% or less from saturated fats, daily to have 5 or more servings of fruits and vegetables.  Biometrics:    Nutrition Therapy Plan and Nutrition Goals:     Nutrition Therapy & Goals - 01/27/16 0900    Nutrition Therapy   Diet Ms Winstead prefers not to meet the dietian; she cooks healthy and eats small portion sizes      Nutrition Discharge: Rate Your Plate Scores:   Psychosocial: Target Goals: Acknowledge presence or absence of depression, maximize coping skills, provide positive support system. Participant is able to  verbalize types and ability to use techniques and skills needed for reducing stress and depression.  Initial Review & Psychosocial Screening:     Initial Psych Review & Screening - 01/27/16 0900    Family Dynamics   Good Support System? Yes   Comments Ms Schimek has good support from her husband and states she has no depression. She is disappointed with the long recovery from her pneumonia, but is looking forward to monitored exercise in [facility] and learning more aboput COPD.   Barriers   Psychosocial barriers to participate in program There are no identifiable barriers or psychosocial needs.;The patient should benefit from training in stress management and relaxation.   Screening Interventions   Interventions Encouraged to exercise      Quality of Life Scores:     Quality of Life - 01/27/16 0900    Quality of Life Scores   Health/Function Pre 29.25 %   Socioeconomic Pre 30 %   Psych/Spiritual Pre 30 %   Family Pre 30 %   GLOBAL Pre 29.66 %      PHQ-9:     Recent Review Flowsheet Data    Depression screen [facility] 2/9 01/27/2016   Decreased Interest 0   Down, Depressed, Hopeless 0   PHQ - 2 Score 0   Altered sleeping 0   Tired, decreased energy 0   Change in appetite 0   Feeling bad or failure about yourself  0   Trouble concentrating 0   Suicidal thoughts 0   PHQ-9 Score 0   Difficult doing work/chores Not difficult at all      Psychosocial Evaluation and Intervention:     Psychosocial Evaluation - 02/02/16 1207    Psychosocial Evaluation & Interventions   Interventions Encouraged to exercise with the program and follow exercise prescription;Relaxation education;Stress management education   Comments Counselor met with Ms. Comp for initial psychosocial evaluation.  She is a 80 year old who came into this program subsequent to a serious bout with pneumonia that lasted over a month.  She has a strong support system with a spouse of 28 years and  several adult children  who live closeby.  She also is actively involved in her local church and lives in a retirement community.  Ms. Mcguirt states that she doesn't sleep great but gets at least 6 hours of sleep per night.  She states her appetite is good and she denies a history of or current symptoms of depression or anxiety.  Ms. Huser states she is typically in a positive mood and other than her health and adjusting to moving to this area several years ago, she has minimal stress in her life.  Ms. Askin appeared to have some difficulty with her memory during this evaluation and could not remember the name of the College or city that she lived in while attending there when she was younger.  She admitted problems with memory had been ongoing since the move to this area.  Ms. Kennerson has goals to improve her breathing, get into shape and just "be stronger" while in this program.  She plans to work out at the [employer] and maybe even swim in the salt water pool there following completion of this program.  Counselor will continue to follow with Ms. Schuld throughout this program.       Continued Psychosocial Services Needed Yes  Ms. Ruberg will benefit from the psychoeducational components of this program, as well as consistent exercise.       Psychosocial Re-Evaluation:     Psychosocial Re-Evaluation      02/18/16 1100 03/24/16 1113 03/31/16 1126       Psychosocial Re-Evaluation   Comments Follow up with Ms. Karapetian today reporting having a difficult time completing the workouts due to her "body not cooperating."  She continues to have a positive mood and her memory appeared to be better today sharing places she has lived as a traveling [place] wife and how much she enjoys living in the retirement community.  Counselor will continue to follow with Ms. Mormino while in this program.  Counselor follow up with Ms. Colton today stating she has finally begun to see some benefits from this program with increased  strength, stamina and energy.  She also reports she is breathing better and "standing taller" as a result of consistently working out here.  Ms. Raliegh Ip states her mood and sleep remain good and she plans to work [place] with her [place] 3x[place]week upon completion of this program.  Counselor commended Ms. Arntson for her hard work and commitment to consistent exercise resulting in such positive benefits.   Kayliegh stated that she feels stronger and has more energy as she attends the [employer]. She increased her workload on the treadmill today and was "dancing to the music". When I told her "No dancing on the treadmill" She responded with"I feel like dancing, I am so much better since I started this exercise.  [facility]       Education: Education Goals: Education classes will be provided on a weekly basis, covering required topics. Participant will state understanding/return demonstration of topics presented.  Learning Barriers/Preferences:     Learning Barriers/Preferences - 01/27/16 0900    Learning Barriers/Preferences   Learning Barriers None   Learning Preferences Group Instruction;Individual Instruction;Pictoral;Skilled Demonstration;Verbal Instruction;Video;Written Material      Education Topics: Initial Evaluation Education: - Verbal, written and demonstration of respiratory meds, RPE/PD scales, oximetry and breathing techniques. Instruction on use of nebulizers and MDIs: cleaning and proper use, rinsing mouth with steroid doses and importance of monitoring MDI activations.  Pulmonary Rehab from 03/31/2016 in [facility] Cardiac and Pulmonary Rehab   Date  01/27/16   Educator  LB   Instruction Review Code  2- meets goals/outcomes      General Nutrition Guidelines/Fats and Fiber: -Group instruction provided by verbal, written material, models and posters to present the general guidelines for heart healthy nutrition. Gives an explanation and review of  dietary fats and fiber.      Pulmonary Rehab from 03/31/2016 in [facility] Cardiac and Pulmonary Rehab   Date  02/23/16   Educator  CR   Instruction Review Code  2- meets goals/outcomes      Controlling Sodium/Reading Food Labels: -Group verbal and written material supporting the discussion of sodium use in heart healthy nutrition. Review and explanation with models, verbal and written materials for utilization of the food label.   Exercise Physiology & Risk Factors: - Group verbal and written instruction with models to review the exercise physiology of the cardiovascular system and associated critical values. Details cardiovascular disease risk factors and the goals associated with each risk factor.   Aerobic Exercise & Resistance Training: - Gives group verbal and written discussion on the health impact of inactivity. On the components of aerobic and resistive training programs and the benefits of this training and how to safely progress through these programs.   Flexibility, Balance, General Exercise Guidelines: - Provides group verbal and written instruction on the benefits of flexibility and balance training programs. Provides general exercise guidelines with specific guidelines to those with heart or lung disease. Demonstration and skill practice provided.      Pulmonary Rehab from 03/31/2016 in [facility] Cardiac and Pulmonary Rehab   Date  03/31/16   Educator  BS   Instruction Review Code  2- meets goals/outcomes      Stress Management: - Provides group verbal and written instruction about the health risks of elevated stress, cause of high stress, and healthy ways to reduce stress.   Depression: - Provides group verbal and written instruction on the correlation between heart/lung disease and depressed mood, treatment options, and the stigmas associated with seeking treatment.      Pulmonary Rehab from 03/31/2016 in [facility] Cardiac and Pulmonary Rehab   Date  02/25/16   Educator  [facility]    Instruction Review Code  2- meets goals/outcomes      Exercise & Equipment Safety: - Individual verbal instruction and demonstration of equipment use and safety with use of the equipment.      Pulmonary Rehab from 03/31/2016 in [facility] Cardiac and Pulmonary Rehab   Date  01/30/16   Educator  RM   Instruction Review Code  2- meets goals/outcomes      Infection Prevention: - Provides verbal and written material to individual with discussion of infection control including proper hand washing and proper equipment cleaning during exercise session.      Pulmonary Rehab from 03/31/2016 in [facility] Cardiac and Pulmonary Rehab   Date  01/30/16   Educator  RM   Instruction Review Code  2- meets goals/outcomes      Falls Prevention: - Provides verbal and written material to individual with discussion of falls prevention and safety.      Pulmonary Rehab from 03/31/2016 in [facility] Cardiac and Pulmonary Rehab   Date  01/27/16   Educator  LB   Instruction Review Code  2- meets goals/outcomes      Diabetes: - Individual verbal and written instruction to review signs/symptoms of diabetes, desired ranges of glucose level fasting, after  meals and with exercise. Advice that pre and post exercise glucose checks will be done for 3 sessions at entry of program.   Chronic Lung Diseases: - Group verbal and written instruction to review new updates, new respiratory medications, new advancements in procedures and treatments. Provide informative websites and "800" numbers of self-education.      Pulmonary Rehab from 03/31/2016 in [facility] Cardiac and Pulmonary Rehab   Date  03/29/16   Educator  Hulen Luster, RT   Instruction Review Code  2- meets goals/outcomes      Lung Procedures: - Group verbal and written instruction to describe testing methods done to diagnose lung disease. Review the outcome of test results. Describe the treatment choices: Pulmonary Function Tests, ABGs and oximetry.   Energy  Conservation: - Provide group verbal and written instruction for methods to conserve energy, plan and organize activities. Instruct on pacing techniques, use of adaptive equipment and posture/positioning to relieve shortness of breath.   Triggers: - Group verbal and written instruction to review types of environmental controls: home humidity, furnaces, filters, dust mite/pet prevention, HEPA vacuums. To discuss weather changes, air quality and the benefits of nasal washing.      Pulmonary Rehab from 03/31/2016 in [facility] Cardiac and Pulmonary Rehab   Date  03/15/16   Educator  LB   Instruction Review Code  2- meets goals/outcomes      Exacerbations: - Group verbal and written instruction to provide: warning signs, infection symptoms, calling MD promptly, preventive modes, and value of vaccinations. Review: effective airway clearance, coughing and/or vibration techniques. Create an Sport and exercise psychologist.      Pulmonary Rehab from 03/31/2016 in [facility] Cardiac and Pulmonary Rehab   Date  02/02/16   Educator  LB   Instruction Review Code  2- meets goals/outcomes      Oxygen: - Individual and group verbal and written instruction on oxygen therapy. Includes supplement oxygen, available portable oxygen systems, continuous and intermittent flow rates, oxygen safety, concentrators, and Medicare reimbursement for oxygen.   Respiratory Medications: - Group verbal and written instruction to review medications for lung disease. Drug class, frequency, complications, importance of spacers, rinsing mouth after steroid MDI's, and proper cleaning methods for nebulizers.      Pulmonary Rehab from 03/31/2016 in [facility] Cardiac and Pulmonary Rehab   Date  01/27/16   Educator  LB   Instruction Review Code  2- meets goals/outcomes      AED/CPR: - Group verbal and written instruction with the use of models to demonstrate the basic use of the AED with the basic ABC's of resuscitation.   Breathing Retraining: - Provides  individuals verbal and written instruction on purpose, frequency, and proper technique of diaphragmatic breathing and pursed-lipped breathing. Applies individual practice skills.      Pulmonary Rehab from 03/31/2016 in [facility] Cardiac and Pulmonary Rehab   Date  01/30/16   Educator  SJ   Instruction Review Code  2- meets goals/outcomes      Anatomy and Physiology of the Lungs: - Group verbal and written instruction with the use of models to provide basic lung anatomy and physiology related to function, structure and complications of lung disease.      Pulmonary Rehab from 03/31/2016 in [facility] Cardiac and Pulmonary Rehab   Date  02/13/16   Educator  Misty Stanley RRT   Instruction Review Code  2- meets goals/outcomes      Heart Failure: - Group verbal and written instruction on the basics of heart failure: signs/symptoms, treatments, explanation  of ejection fraction, enlarged heart and cardiomyopathy.   Sleep Apnea: - Individual verbal and written instruction to review Obstructive Sleep Apnea. Review of risk factors, methods for diagnosing and types of masks and machines for OSA.   Anxiety: - Provides group, verbal and written instruction on the correlation between heart/lung disease and anxiety, treatment options, and management of anxiety.   Relaxation: - Provides group, verbal and written instruction about the benefits of relaxation for patients with heart/lung disease. Also provides patients with examples of relaxation techniques.   Knowledge Questionnaire Score:     Knowledge Questionnaire Score - 01/27/16 0900    Knowledge Questionnaire Score   Pre Score 6/10       Core Components/Risk Factors/Patient Goals at Admission:     Personal Goals and Risk Factors at Admission - 01/27/16 0900    Core Components/Risk Factors/Patient Goals on Admission   Sedentary Yes   Intervention Provide advice, education, support and counseling about physical activity/exercise needs.;Develop an  individualized exercise prescription for aerobic and resistive training based on initial evaluation findings, risk stratification, comorbidities and participant's personal goals.  Ms Carleene Mains had pneumonia in December and has a long recovery period. She previously exercised at the [facility] and is looking forward to return to her exercise at their facility.   Expected Outcomes Achievement of increased cardiorespiratory fitness and enhanced flexibility, muscular endurance and strength shown through measurements of functional capaciy and personal statement of participant.   Improve shortness of breath with ADL's Yes   Intervention Provide education, individualized exercise plan and daily activity instruction to help decrease symptoms of SOB with activities of daily living.  Ms wiersma rates stairs and hills a "3" on shortness of breath questionnaire.   Expected Outcomes Short Term: Achieves a reduction of symptoms when performing activities of daily living.   Develop more efficient breathing techniques such as purse lipped breathing and diaphragmatic breathing; and practicing self-pacing with activity Yes   Intervention Provide education, demonstration and support about specific breathing techniuqes utilized for more efficient breathing. Include techniques such as pursed lipped breathing, diaphragmatic breathing and self-pacing activity.   Expected Outcomes Short Term: Participant will be able to demonstrate and use breathing techniques as needed throughout daily activities.   Increase knowledge of respiratory medications and ability to use respiratory devices properly  Yes   Intervention Provide education and demonstration as needed of appropriate use of medications, inhalers, and oxygen therapy.  Ms Standre takes Spiriva and wears 2l/m [place] at night with sleep. She is being tested this week to see if she still needs the oxygen at night.   Expected Outcomes Short Term: Achieves understanding  of medications use. Understands that oxygen is a medication prescribed by physician. Demonstrates appropriate use of inhaler and oxygen therapy.      Core Components/Risk Factors/Patient Goals Review:      Goals and Risk Factor Review      01/30/16 1243 02/04/16 1509 02/06/16 1343 02/06/16 1348 02/23/16 1000   Core Components/Risk Factors/Patient Goals Review   Personal Goals Review Develop more efficient breathing techniques such as purse lipped breathing and diaphragmatic breathing and practicing self-pacing with activity. Sedentary Improve shortness of breath with ADL's Increase knowledge of respiratory medications and ability to use respiratory devices properly. Improve shortness of breath with ADL's;Develop more efficient breathing techniques such as purse lipped breathing and diaphragmatic breathing and practicing self-pacing with activity.   Review Reviewed PLB with Ms. Paugh today. Ms Hegeman has increased her time with her  exercise goals on the NS and [employer]. She has become more knowledgeable in setting up the equipment and does want to progress with her exercise goals. Ms Leamer does want to continue exercising at the [place] gym with her husband. Ms. Austill says that walking causes her to have the most SOB.  Reviewed PLB with her and talked about taking rest periods while walking and shopping. Ms. Bellmore says that she is comfortable with the respiratory medications she is taking.  She has no questions at this time and does not need a spcer with the medicines that she is currently taking. Ms Gao states she is feeling so much better - she is breathing better with activites at home and is walking faster. She is looking forward to getting back to [place] and exercise again with her husband.   Expected Outcomes She will use this technique while exercising to be able to exercise for longer periods of time. Continue improving her strength and stamina to add one extra  day at the [facility]. Increase in distance she can walk and being able to shop longer. Taking her medications as prescribed should help decreased her SOB.      02/27/16 1238 03/30/16 0746         Core Components/Risk Factors/Patient Goals Review   Personal Goals Review Increase knowledge of respiratory medications and ability to use respiratory devices properly. Increase Strength and Stamina;Sedentary;Develop more efficient breathing techniques such as purse lipped breathing and diaphragmatic breathing and practicing self-pacing with activity.;Improve shortness of breath with ADL's;Increase knowledge of respiratory medications and ability to use respiratory devices properly.      Review Ms. Capano takes Spiriva for her breathing at home.  She understands how to use the device and when she is to take her medication. Ms Schwenke attends very regularly and is dedicated to regaining her strength after her exacerbation with hospitalization. Her goalis to return to the [employer] [facility] [employer] and exercise with he husband. She has  gradually increased her exercise goal time to 35mns on eac h piece of equipment. She is doing more things at home and getting out more with less shortness of breath and manages this all with pacing . Spiriva is the only inhaler she uses and she does have a good understanding of the medicine and is compliant.      Expected Outcomes Taking her medication as prescribed will help decrease her SOB.          Core Components/Risk Factors/Patient Goals at Discharge (Final Review):      Goals and Risk Factor Review - 03/30/16 0746    Core Components/Risk Factors/Patient Goals Review   Personal Goals Review Increase Strength and Stamina;Sedentary;Develop more efficient breathing techniques such as purse lipped breathing and diaphragmatic breathing and practicing self-pacing with activity.;Improve shortness of breath with ADL's;Increase knowledge of respiratory medications  and ability to use respiratory devices properly.   Review Ms KPollakattends very regularly and is dedicated to regaining her strength after her exacerbation with hospitalization. [facility] [place] [place] [facility] [place] [facility] [place]. She has  gradually increased her exercise goal time to 171ms on eac h piece of equipment. She is doing more things at home and getting out more with less shortness of breath and manages this all with pacing . Spiriva is the only inhaler she uses and she does have a good understanding of the medicine and is compliant.  ITP Comments:     ITP Comments      01/30/16 1044           ITP Comments Personal and exercise goals expected to be met in 34 more sessions. Progress on specific individualized goals will be charted in patient's ITP. Upon completion of the program the patient will be comfortable managing exercise goals and progression on their own.           Comments: 30 Day Review

## 2016-04-07 DIAGNOSIS — J449 Chronic obstructive pulmonary disease, unspecified: Secondary | ICD-10-CM

## 2016-04-07 NOTE — Progress Notes (Signed)
Daily Session Note  Patient Details  Name: Cheryl Cannon MRN: [id number] Date of Birth: July 30, 1936 Referring Provider:    Encounter Date: 04/07/2016  Check In:     Session Check In - 04/07/16 1140    Check-In   Location [facility]-Cardiac & Pulmonary Rehab   Staff Present Heath Lark, RN, BSN, CCRP;Laureen Owens Shark, BS, RRT, Respiratory Lennie Hummer, MA, ACSM RCEP, Exercise Physiologist;Amanda Oletta Darter, BA, ACSM CEP, Exercise Physiologist;Diane Joya Gaskins, RN, BSN   Supervising physician immediately available to respond to emergencies [facility] immediately available ER MD   Physician(s) Dr. Joni Fears and Clearnce Hasten   Medication changes reported     No   Fall or balance concerns reported    No   Warm-up and Cool-down Performed on first and last piece of equipment   Resistance Training Performed Yes   VAD Patient? No   Pain Assessment   Currently in Pain? No/denies         Goals Met:  Proper associated with RPD/PD & O2 Sat Independence with exercise equipment Exercise tolerated well Strength training completed today  Goals Unmet:  Not Applicable  Comments: Dee is progressing well with exercise   Dr. Emily Filbert is Medical Director for [place] and [facility].

## 2016-04-09 ENCOUNTER — Encounter: Payer: Medicare Other | Admitting: Respiratory Therapy

## 2016-04-09 DIAGNOSIS — J449 Chronic obstructive pulmonary disease, unspecified: Secondary | ICD-10-CM

## 2016-04-09 NOTE — Progress Notes (Signed)
Daily Session Note  Patient Details  Name: Cheryl Cannon MRN: [id number] Date of Birth: 12/25/1935 Referring Provider:    Encounter Date: 04/09/2016  Check In:     Session Check In - 04/09/16 1121    Check-In   Location [facility]-Cardiac & Pulmonary Rehab   Staff Present Alberteen Sam, MA, ACSM RCEP, Exercise Physiologist;Amanda Oletta Darter, BA, ACSM CEP, Exercise Physiologist;Stacey Blanch Media, RRT, RCP, Respiratory Therapist;Carroll Enterkin, RN, BSN   Supervising physician immediately available to respond to emergencies [facility] immediately available ER MD   Physician(s) Cinda Quest and Jimmye Norman   Medication changes reported     No   Fall or balance concerns reported    No   Warm-up and Cool-down Performed on first and last piece of equipment   Resistance Training Performed Yes   VAD Patient? No   Pain Assessment   Currently in Pain? No/denies   Multiple Pain Sites No         Goals Met:  Proper associated with RPD/PD & O2 Sat Independence with exercise equipment Exercise tolerated well Strength training completed today  Goals Unmet:  Not Applicable  Comments: Pt able to do exercise prescription without complaint.   Dr. Emily Filbert is Medical Director for [place] and [facility].

## 2016-04-12 ENCOUNTER — Encounter: Payer: Medicare Other | Admitting: *Deleted

## 2016-04-12 DIAGNOSIS — J449 Chronic obstructive pulmonary disease, unspecified: Secondary | ICD-10-CM

## 2016-04-12 NOTE — Progress Notes (Signed)
Daily Session Note  Patient Details  Name: Cheryl Cannon MRN: [id number] Date of Birth: 24-Dec-1935 Referring Provider:    Encounter Date: 04/12/2016  Check In:     Session Check In - 04/12/16 1128    Check-In   Location [facility]-Cardiac & Pulmonary Rehab   Staff Present Nyoka Cowden, RN;Laureen Owens Shark, BS, RRT, Respiratory Therapist;Ervie Mccard, RN, Moises Blood, BS, ACSM CEP, Exercise Physiologist   Supervising physician immediately available to respond to emergencies [facility] immediately available ER MD   Physician(s) Dr. Mariea Clonts and Dr. Reita Cliche   Medication changes reported     No   Fall or balance concerns reported    No   Warm-up and Cool-down Performed on first and last piece of equipment   Resistance Training Performed Yes   VAD Patient? No   Pain Assessment   Currently in Pain? No/denies         Goals Met:  Proper associated with RPD/PD & O2 Sat Exercise tolerated well  Goals Unmet:  Not Applicable  Comments:     Dr. Emily Filbert is Medical Director for [place] and [facility].

## 2016-04-14 ENCOUNTER — Encounter: Payer: Medicare Other | Admitting: *Deleted

## 2016-04-14 DIAGNOSIS — J449 Chronic obstructive pulmonary disease, unspecified: Secondary | ICD-10-CM | POA: Diagnosis not present

## 2016-04-14 NOTE — Progress Notes (Signed)
Daily Session Note  Patient Details  Name: Cheryl Cannon MRN: [id number] Date of Birth: 1936/07/31 Referring Provider:    Encounter Date: 04/14/2016  Check In:     Session Check In - 04/14/16 1131    Check-In   Location [facility]-Cardiac & Pulmonary Rehab   Staff Present Nyoka Cowden, RN;Jessica Luan Pulling, MA, ACSM RCEP, Exercise Physiologist;Amanda Oletta Darter, BA, ACSM CEP, Exercise Physiologist;Laureen Owens Shark, BS, RRT, Respiratory Orlie Dakin, RN, BSN   Supervising physician immediately available to respond to emergencies [facility] immediately available ER MD   Physician(s) Cinda Quest and [place]   Medication changes reported     No   Fall or balance concerns reported    No   Warm-up and Cool-down Not performed (comment)   Resistance Training Performed Yes   VAD Patient? No   Pain Assessment   Currently in Pain? No/denies   Multiple Pain Sites No         Goals Met:  Proper associated with RPD/PD & O2 Sat Independence with exercise equipment Exercise tolerated well Strength training completed today Completed post 6 min walk test  Goals Unmet:  Not Applicable  Comments:      6 Minute Walk      01/27/16 1254 04/14/16 1032     6 Minute Walk   Phase Initial Discharge    Distance 855 feet 1100 feet    Distance % Change  28.6 %    Walk Time 6 minutes 6 minutes    # of Rest Breaks 0 0    RPE 13 13    Perceived Dyspnea  3 3.5    Symptoms No     Resting HR 71 bpm 77 bpm    Resting BP 106/64 mmHg 120/60 mmHg    Max Ex. HR 91 bpm 101 bpm    Max Ex. BP 124/72 mmHg 112/66 mmHg       Pt able to follow exercise prescription today without complaint.  Will continue to monitor for progression.    Dr. Emily Filbert is Medical Director for [place] and [facility].

## 2016-04-15 ENCOUNTER — Other Ambulatory Visit: Payer: Self-pay | Admitting: Obstetrics and Gynecology

## 2016-04-15 ENCOUNTER — Ambulatory Visit
Admission: RE | Admit: 2016-04-15 | Discharge: 2016-04-15 | Disposition: A | Discharge status: Home / Self Care | Payer: Medicare Other | Source: Ambulatory Visit | Attending: Obstetrics and Gynecology | Admitting: Obstetrics and Gynecology

## 2016-04-15 DIAGNOSIS — Z1231 Encounter for screening mammogram for malignant neoplasm of breast: Secondary | ICD-10-CM | POA: Insufficient documentation

## 2016-04-21 ENCOUNTER — Encounter: Payer: Medicare Other | Admitting: *Deleted

## 2016-04-21 DIAGNOSIS — J449 Chronic obstructive pulmonary disease, unspecified: Secondary | ICD-10-CM | POA: Diagnosis not present

## 2016-04-21 NOTE — Progress Notes (Signed)
Daily Session Note  Patient Details  Name: Cheryl Cannon MRN: [id number] Date of Birth: 30-Apr-1936 Referring Provider:    Encounter Date: 04/21/2016  Check In:     Session Check In - 04/21/16 1142    Check-In   Staff Present Nyoka Cowden, RN;Diane Joya Gaskins, RN, BSN;Laureen Owens Shark BS, RRT, Respiratory Therapist   Supervising physician immediately available to respond to emergencies [facility] immediately available ER MD   Physician(s) Jacqualine Code and Jimmye Norman   Medication changes reported     No   Fall or balance concerns reported    Yes   Warm-up and Cool-down Performed on first and last piece of equipment   Resistance Training Performed Yes   VAD Patient? No   Pain Assessment   Currently in Pain? No/denies         Goals Met:  Proper associated with RPD/PD & O2 Sat Exercise tolerated well Personal goals reviewed Strength training completed today  Goals Unmet:  Not Applicable  Comments:  Pt able to follow exercise prescription today without complaint.  Will continue to monitor for progression.    Dr. Emily Filbert is Medical Director for [place] and [facility].

## 2016-04-23 ENCOUNTER — Encounter: Payer: Medicare Other | Attending: Specialist | Admitting: Respiratory Therapy

## 2016-04-23 ENCOUNTER — Encounter: Payer: Self-pay | Admitting: Neurology

## 2016-04-23 DIAGNOSIS — J449 Chronic obstructive pulmonary disease, unspecified: Secondary | ICD-10-CM | POA: Insufficient documentation

## 2016-04-23 NOTE — Progress Notes (Signed)
Daily Session Note  Patient Details  Name: Cheryl Cannon MRN: [id number] Date of Birth: [birth year] Referring Provider:    Encounter Date: 04/23/2016  Check In:     Session Check In - 04/23/16 1049    Check-In   Location [facility]-Cardiac & Pulmonary Rehab   Staff Present Heath Lark, RN, BSN, CCRP;Carroll Enterkin, RN, Building control surveyor, RRT, RCP, Respiratory Lennie Hummer, MA, ACSM RCEP, Exercise Physiologist   Supervising physician immediately available to respond to emergencies [facility] immediately available ER MD   Physician(s) Dr. Edd Fabian and Darlys Gales   Medication changes reported     No   Fall or balance concerns reported    No   Warm-up and Cool-down Performed on first and last piece of equipment   Resistance Training Performed Yes   VAD Patient? No   Pain Assessment   Currently in Pain? No/denies         Goals Met:  Proper associated with RPD/PD & O2 Sat Independence with exercise equipment Exercise tolerated well Strength training completed today  Goals Unmet:  Not Applicable  Comments: Pt able to follow exercise prescription today without complaint.  Will continue to monitor for progression.   Dr. Emily Filbert is Medical Director for [place] and [facility].

## 2016-04-26 ENCOUNTER — Encounter: Payer: Medicare Other | Admitting: *Deleted

## 2016-04-26 DIAGNOSIS — J449 Chronic obstructive pulmonary disease, unspecified: Secondary | ICD-10-CM

## 2016-04-26 NOTE — Progress Notes (Signed)
Daily Session Note  Patient Details  Name: HAILLIE RADU MRN: [id number] Date of Birth: 03-16-1936 Referring Provider:    Encounter Date: 04/26/2016  Check In:     Session Check In - 04/26/16 1154    Check-In   Location [facility]-Cardiac & Pulmonary Rehab   Staff Present Carson Myrtle, BS, RRT, Respiratory Therapist;Vernesha Talbot Amedeo Plenty, BS, ACSM CEP, Exercise Physiologist;Jessica Luan Pulling, MA, ACSM RCEP, Exercise Physiologist   Supervising physician immediately available to respond to emergencies [facility] immediately available ER MD   Physician(s) Cinda Quest and [place]   Medication changes reported     No   Fall or balance concerns reported    No   Warm-up and Cool-down Performed on first and last piece of equipment   Resistance Training Performed Yes   VAD Patient? No   Pain Assessment   Currently in Pain? No/denies   Multiple Pain Sites No         Goals Met:  Proper associated with RPD/PD & O2 Sat Independence with exercise equipment Exercise tolerated well Strength training completed today  Goals Unmet:  Not Applicable  Comments: Patient completed exercise prescription and all exercise goals during rehab session. The exercise was tolerated well and the patient is progressing in the program.     Dr. Emily Filbert is Medical Director for [place] and [facility].

## 2016-04-28 ENCOUNTER — Encounter: Payer: Medicare Other | Admitting: Respiratory Therapy

## 2016-04-28 DIAGNOSIS — J449 Chronic obstructive pulmonary disease, unspecified: Secondary | ICD-10-CM | POA: Diagnosis not present

## 2016-04-28 NOTE — Patient Instructions (Signed)
Discharge Instructions  Patient Details  Name: Cheryl Cannon MRN: [id number] Date of Birth: May 13, 1936 Referring Provider:  Erby Pian, MD   Number of Visits: 74  Reason for Discharge:  Patient reached a stable level of exercise. Patient independent in their exercise.  Smoking History:  History  Smoking status   Former Smoker -- 1.00 packs/day for 44 years   Quit date: 11/22/1998  Smokeless tobacco   Not on file    Diagnosis:  COPD, moderate ([place])  Initial Exercise Prescription:     Initial Exercise Prescription - 04/28/16 1400    Date of Initial Exercise RX and Referring Provider   Referring Provider Cheryl Linsey MD      Discharge Exercise Prescription (Final Exercise Prescription Changes):     Exercise Prescription Changes - 04/27/16 1300    Exercise Review   Progression Yes   Response to Exercise   Blood Pressure (Admit) 110/64 mmHg   Blood Pressure (Exercise) 132/72 mmHg   Blood Pressure (Exit) 108/64 mmHg   Heart Rate (Admit) 76 bpm   Heart Rate (Exercise) 104 bpm   Heart Rate (Exit) 94 bpm   Oxygen Saturation (Admit) 93 %   Oxygen Saturation (Exercise) 91 %   Oxygen Saturation (Exit) 94 %   Rating of Perceived Exertion (Exercise) 13   Perceived Dyspnea (Exercise) 2   Symptoms None   Duration Progress to 50 minutes of aerobic without signs/symptoms of physical distress   Intensity Rest + 30   Progression   Progression Continue to progress workloads to maintain intensity without signs/symptoms of physical distress.   Average METs 2.1   Resistance Training   Training Prescription Yes   Weight 1   Reps 10-12   Interval Training   Interval Training No   Treadmill   MPH 1.5   Grade 1   Minutes 15   NuStep   Level 5   Watts 22   Minutes 15   Biostep-RELP   Level 3   Watts 15   Minutes 15   Home Exercise Plan   Plans to continue exercise at [employer] (comment)  [facility] [place]      Functional Capacity:      6 Minute Walk      01/27/16 1254 04/14/16 1032     6 Minute Walk   Phase Initial Discharge    Distance 855 feet 1100 feet    Distance % Change  28.6 %    Walk Time 6 minutes 6 minutes    # of Rest Breaks 0 0    RPE 13 13    Perceived Dyspnea  3 3.5    Symptoms No     Resting HR 71 bpm 77 bpm    Resting BP 106/64 mmHg 120/60 mmHg    Max Ex. HR 91 bpm 101 bpm    Max Ex. BP 124/72 mmHg 112/66 mmHg       Quality of Life:     Quality of Life - 04/28/16 1000    Quality of Life Scores   Health/Function Pre 29.25 %   Health/Function Post 29.21 %   Health/Function % Change -0.14 %   Socioeconomic Pre 30 %   Socioeconomic Post 28.21 %   Socioeconomic % Change  -5.97 %   Psych/Spiritual Pre 30 %   Psych/Spiritual Post 30 %   Psych/Spiritual % Change 0 %   Family Pre 30 %   Family Post 30 %   Family % Change 0 %  GLOBAL Pre 29.66 %   GLOBAL Post 29.29 %   GLOBAL % Change -1.25 %      Personal Goals: Goals established at orientation with interventions provided to work toward goal.     Personal Goals and Risk Factors at Admission - 01/27/16 0900    Core Components/Risk Factors/Patient Goals on Admission   Sedentary Yes   Intervention Provide advice, education, support and counseling about physical activity/exercise needs.;Develop an individualized exercise prescription for aerobic and resistive training based on initial evaluation findings, risk stratification, comorbidities and participant's personal goals.  Cheryl Cheryl Cannon had pneumonia in December and has a long recovery period. She previously exercised at the [facility] and is looking forward to return to her exercise at their facility.   Expected Outcomes Achievement of increased cardiorespiratory fitness and enhanced flexibility, muscular endurance and strength shown through measurements of functional capaciy and personal statement of participant.   Improve shortness of breath with ADL's Yes   Intervention  Provide education, individualized exercise plan and daily activity instruction to help decrease symptoms of SOB with activities of daily living.  Cheryl Cannon rates stairs and hills a "3" on shortness of breath questionnaire.   Expected Outcomes Short Term: Achieves a reduction of symptoms when performing activities of daily living.   Develop more efficient breathing techniques such as purse lipped breathing and diaphragmatic breathing; and practicing self-pacing with activity Yes   Intervention Provide education, demonstration and support about specific breathing techniuqes utilized for more efficient breathing. Include techniques such as pursed lipped breathing, diaphragmatic breathing and self-pacing activity.   Expected Outcomes Short Term: Participant will be able to demonstrate and use breathing techniques as needed throughout daily activities.   Increase knowledge of respiratory medications and ability to use respiratory devices properly  Yes   Intervention Provide education and demonstration as needed of appropriate use of medications, inhalers, and oxygen therapy.  Cheryl Cannon takes Spiriva and wears 2l/m [place] at night with sleep. She is being tested this week to see if she still needs the oxygen at night.   Expected Outcomes Short Term: Achieves understanding of medications use. Understands that oxygen is a medication prescribed by physician. Demonstrates appropriate use of inhaler and oxygen therapy.       Personal Goals Discharge:     Goals and Risk Factor Review - 04/28/16 1000    Core Components/Risk Factors/Patient Goals Review   Personal Goals Review Sedentary;Increase Strength and Stamina;Improve shortness of breath with ADL's;Develop more efficient breathing techniques such as purse lipped breathing and diaphragmatic breathing and practicing self-pacing with activity.;Increase knowledge of respiratory medications and ability to use respiratory devices properly.   Review Cheryl Cannon has  one more session and then will graduate. She has improved her 38md by 2456f- Minimal Importance Difference for COPD is [id number] had lost much of her stamina and strength during her illness last year, but has improved and increased her exercise goals with the plan [facility]  She has a good awareness of her MDIs, PLB, pacing, and management of her daily life activites for COPD .       Nutrition & Weight - Outcomes:    Nutrition:     Nutrition Therapy & Goals - 01/27/16 0900    Nutrition Therapy   Diet Cheryl KarSpelmanefers not to meet the dietian; she cooks healthy and eats small portion sizes      Nutrition Discharge:  Education Questionnaire Score:     Knowledge Questionnaire Score - 01/27/16 0900    Knowledge Questionnaire Score   Pre Score 6/10      Goals reviewed with patient; copy given to patient.

## 2016-04-28 NOTE — Progress Notes (Signed)
Daily Session Note  Patient Details  Name: Cheryl Cannon MRN: [id number] Date of Birth: 03/18/1936 Referring Provider:            Pulmonary Rehab from 04/28/2016 in [facility]   Referring Provider  Fleming, [place] MD      Encounter Date: 04/28/2016  Check In:     Session Check In - 04/28/16 1106    Check-In   Location [facility]   Staff Present Laureen Brown, BS, RRT, Respiratory Therapist;Carroll Enterkin, RN, BSN; , BA, ACSM CEP, Exercise Physiologist   Supervising physician immediately available to respond to emergencies [facility] immediately available ER MD   Physician(s) Yao and [place]   Medication changes reported     No   Fall or balance concerns reported    No   Warm-up and Cool-down Performed on first and last piece of equipment   Resistance Training Performed Yes   VAD Patient? No   Pain Assessment   Currently in Pain? No/denies         Goals Met:  Proper associated with RPD/PD & O2 Sat Independence with exercise equipment Exercise tolerated well Strength training completed today  Goals Unmet:  Not Applicable  Comments: Pt able to follow exercise prescription today without complaint.  Will continue to monitor for progression.    Dr. Mark Miller is Medical Director for [facility] and [facility]. 

## 2016-04-28 NOTE — Progress Notes (Signed)
Pulmonary Individual Treatment Plan  Patient Details  Name: ISABELLAMARIE RANDA MRN: [id number] Date of Birth: 20-May-1936 Referring Provider:        Pulmonary Rehab from 04/28/2016 in [facility] Cardiac and Pulmonary Rehab   Referring Provider  Ancil Linsey MD      Initial Encounter Date:       Pulmonary Rehab from 04/28/2016 in [facility] Cardiac and Pulmonary Rehab   Referring Provider  Ancil Linsey MD      Visit Diagnosis: COPD, moderate ([place])  Patient's Home Medications on Admission:  Current outpatient prescriptions:    ALPRAZolam (XANAX) 0.25 MG tablet, Take 0.25 mg by mouth daily as needed for anxiety. , Disp: , Rfl:    apixaban (ELIQUIS) 5 MG TABS tablet, Take 1 tablet (5 mg total) by mouth 2 (two) times daily., Disp: 60 tablet, Rfl: 0   cholecalciferol (VITAMIN D) 1000 UNITS tablet, Take 2,000 Units by mouth daily. , Disp: , Rfl:    cyanocobalamin (,VITAMIN B-12,) 1000 MCG/ML injection, Inject 1,000 mcg into the muscle every 30 (thirty) days. Injection once a month, Disp: , Rfl:    docusate sodium (COLACE) 100 MG capsule, Take 100 mg by mouth 2 (two) times daily., Disp: , Rfl:    donepezil (ARICEPT) 10 MG tablet, Take 1 tablet (10 mg total) by mouth at bedtime., Disp: 90 tablet, Rfl: 3   flecainide (TAMBOCOR) 50 MG tablet, Take 1.5 mg by mouth 2 (two) times daily., Disp: , Rfl:    furosemide (LASIX) 20 MG tablet, Take 1 tablet (20 mg total) by mouth daily., Disp: 30 tablet, Rfl: 0   gabapentin (NEURONTIN) 300 MG capsule, TAKE 4 CAPSULES IN THE MORNING AND 4 CAPSULES IN THE EVENING, Disp: 720 capsule, Rfl: 1   gabapentin (NEURONTIN) 300 MG capsule, Take 1,200 mg by mouth 2 (two) times daily., Disp: , Rfl:    levalbuterol (XOPENEX HFA) 45 MCG/ACT inhaler, Inhale 2 puffs into the lungs every 4 (four) hours as needed for wheezing., Disp: , Rfl:    metoprolol tartrate (LOPRESSOR) 25 MG tablet, Take 12.5 mg by mouth., Disp: , Rfl:    pantoprazole (PROTONIX) 40 MG tablet, Take 40 mg by  mouth daily. , Disp: , Rfl:    tiotropium (SPIRIVA) 18 MCG inhalation capsule, Place 18 mcg into inhaler and inhale daily. , Disp: , Rfl:   Past Medical History: Past Medical History  Diagnosis Date   Lung cancer ([place])    COPD (chronic obstructive pulmonary disease) ([place])    Cancer ([place])    Memory deficit    Celiac disease    A-fib ([place])     Tobacco Use: History  Smoking status   Former Smoker -- 1.00 packs/day for 33 years   Quit date: 11/22/1998  Smokeless tobacco   Not on file    Labs: Recent Review Flowsheet Data    Labs for ITP Cardiac and Pulmonary Rehab Latest Ref Rng 11/11/2015   Hemoglobin A1c 4.0 - 6.0 % 5.3       ADL UCSD:     Pulmonary Assessment Scores      01/27/16 0900 04/28/16 1558     ADL UCSD   ADL Phase Entry Exit    SOB Score total 10 11    Rest 0 0    Walk 0 0    Stairs 3 2    Bath 0 0    Dress 0 0    Shop 0 1       Pulmonary Function Assessment:  Pulmonary Function Assessment - 01/27/16 0900    Pulmonary Function Tests   RV% 88 %   DLCO% 58 %   Initial Spirometry Results   FVC% 92 %  Test date2/11/2014   FEV1% 74 %   FEV1/FVC Ratio 61   Post Bronchodilator Spirometry Results   FVC% 92 %   FEV1% 74 %   FEV1/FVC Ratio 61   Breath   Bilateral Breath Sounds Clear;Decreased  Decreased on right - lobectemy mid and lower sections   Shortness of Breath No      Exercise Target Goals:    Exercise Program Goal: Individual exercise prescription set with THRR, safety & activity barriers. Participant demonstrates ability to understand and report RPE using BORG scale, to self-measure pulse accurately, and to acknowledge the importance of the exercise prescription.  Exercise Prescription Goal: Starting with aerobic activity 30 plus minutes a day, 3 days per week for initial exercise prescription. Provide home exercise prescription and guidelines that participant acknowledges understanding prior to discharge.  Activity  Barriers & Risk Stratification:     Activity Barriers & Cardiac Risk Stratification - 01/27/16 0900    Activity Barriers & Cardiac Risk Stratification   Activity Barriers Deconditioning   Cardiac Risk Stratification Low      6 Minute Walk:     6 Minute Walk      01/27/16 1254 04/14/16 1032     6 Minute Walk   Phase Initial Discharge    Distance 855 feet 1100 feet    Distance % Change  28.6 %    Walk Time 6 minutes 6 minutes    # of Rest Breaks 0 0    RPE 13 13    Perceived Dyspnea  3 3.5    Symptoms No     Resting HR 71 bpm 77 bpm    Resting BP 106/64 mmHg 120/60 mmHg    Max Ex. HR 91 bpm 101 bpm    Max Ex. BP 124/72 mmHg 112/66 mmHg       Initial Exercise Prescription:     Initial Exercise Prescription - 04/28/16 1400    Date of Initial Exercise RX and Referring Provider   Referring Provider Ancil Linsey MD      Perform Capillary Blood Glucose checks as needed.  Exercise Prescription Changes:     Exercise Prescription Changes      01/30/16 1000 02/02/16 1500 02/04/16 1000 02/06/16 1100 02/09/16 1100   Exercise Review   Progression Yes Yes Yes Yes Yes   Response to Exercise   Blood Pressure (Admit) 134/64 mmHg       Blood Pressure (Exercise) 120/62 mmHg       Blood Pressure (Exit) 118/70 mmHg       Heart Rate (Admit) 75 bpm       Heart Rate (Exercise) 104 bpm       Heart Rate (Exit) 95 bpm       Oxygen Saturation (Admit) 94 %       Oxygen Saturation (Exercise) 92 %       Oxygen Saturation (Exit) 96 %       Rating of Perceived Exertion (Exercise) 15       Perceived Dyspnea (Exercise) 3       Symptoms None None None None None   Comments First day of exercise! Patient was oriented to the gym and the equipment functions and settings. Procedures and policies of the gym were outlined and explained. The patient's individual exercise prescription and treatment plan  were reviewed with them. All starting workloads were established based on the results of the  functional testing  done at the initial intake visit. The plan for exercise progression was also introduced and progression will be customized based on the patient's performance and goals.  BX was too difficult for Ms Hinote, so she was changed to the BioStep and did tolerate this goals well. Reviewed individualized exercise prescription and made increases per departmental policy. Exercise increases were discussed with the patient and they were able to perform the new work loads without issue (no signs or symptoms).  Increased time on Biostep machine to further progress patient's stamina. Patient complied with increase and demonstrated ability.     Duration Progress to 30 minutes of continuous aerobic without signs/symptoms of physical distress  Progress to 30 minutes of continuous aerobic without signs/symptoms of physical distress Progress to 30 minutes of continuous aerobic without signs/symptoms of physical distress Progress to 30 minutes of continuous aerobic without signs/symptoms of physical distress   Intensity Rest + 30 Rest + 30 Rest + 30 Rest + 30 Rest + 30   Progression   Progression Continue progressive overload as per policy without signs/symptoms or physical distress. Continue progressive overload as per policy without signs/symptoms or physical distress. Continue progressive overload as per policy without signs/symptoms or physical distress. Continue progressive overload as per policy without signs/symptoms or physical distress. Continue progressive overload as per policy without signs/symptoms or physical distress.   Resistance Training   Training Prescription Yes Yes Yes Yes Yes   Weight 1 1 1 1 1    Reps 10-15 10-15 10-15 10-15 10-15   Treadmill   MPH 1.5 1.1 1.1 1.1 1.1   Grade 0 0 0 0 0   Minutes 10 10 10 10 10    Recumbant Bike   Level 2       RPM 40       Watts 20       Minutes 10       NuStep   Level 2 2 2 2 3    Watts 40 16 16 16 30    Minutes 10 10 12 12 15    Arm Ergometer    Level 1       Watts 10       Minutes 10       Recumbant Elliptical   Level 1       RPM 40       Watts 20       Minutes 10       REL-XR   Level 2       Watts 40       Minutes 10       T5 Nustep   Level 1       Watts 15       Minutes 10       Biostep-RELP   Level 2 1 1 1 1    Watts 40 15 15 15 15    Minutes 10 10 10 15 15      02/13/16 1000 02/16/16 1000 02/23/16 1100 02/25/16 1000 03/08/16 1300   Exercise Review   Progression Yes Yes Yes Yes    Response to Exercise   Blood Pressure (Admit)     110/60 mmHg   Blood Pressure (Exercise)     120/62 mmHg   Blood Pressure (Exit)     122/70 mmHg   Heart Rate (Admit)     70 bpm   Heart Rate (Exercise)  68 bpm   Heart Rate (Exit)     61 bpm   Oxygen Saturation (Admit)     93 %   Oxygen Saturation (Exercise)     91 %   Oxygen Saturation (Exit)     94 %   Rating of Perceived Exertion (Exercise)     17  Ranged from 13-17   Perceived Dyspnea (Exercise)     5  Ranged from 3-5   Symptoms None None None None None   Comments  Reviewed individualized exercise prescription and made increases per departmental policy. Exercise increases were discussed with the patient and they were able to perform the new work loads without issue (no signs or symptoms).  Reviewed individualized exercise prescription and made increases per departmental policy. Exercise increases were discussed with the patient and they were able to perform the new work loads without issue (no signs or symptoms).  Reviewed individualized exercise prescription and made increases per departmental policy. Exercise increases were discussed with the patient and they were able to perform the new work loads without issue (no signs or symptoms).     Duration Progress to 30 minutes of continuous aerobic without signs/symptoms of physical distress Progress to 30 minutes of continuous aerobic without signs/symptoms of physical distress Progress to 30 minutes of continuous aerobic without  signs/symptoms of physical distress Progress to 30 minutes of continuous aerobic without signs/symptoms of physical distress Progress to 30 minutes of continuous aerobic without signs/symptoms of physical distress   Intensity Rest + 30 Rest + 30 Rest + 30 Rest + 30 Rest + 30   Progression   Progression Continue progressive overload as per policy without signs/symptoms or physical distress. Continue progressive overload as per policy without signs/symptoms or physical distress. Continue progressive overload as per policy without signs/symptoms or physical distress. Continue progressive overload as per policy without signs/symptoms or physical distress. Continue progressive overload as per policy without signs/symptoms or physical distress.   Resistance Training   Training Prescription Yes Yes Yes Yes Yes   Weight 1 1 1 1 1    Reps 10-15 10-15 10-15 10-15 10-15   Treadmill   MPH 1.1 1.1 1.1 1.1 1.1   Grade 0 0 0 0 0   Minutes 11 12 13 13 13    NuStep   Level 3 3 3 3 3    Watts 30 30 30 30 20    Minutes 15 15 15 15 15    Biostep-RELP   Level 1 1 1 1 2    Watts 15 15 15 15 13    Minutes 15 15 15 15 15      03/10/16 1100 03/12/16 1100 03/22/16 1600 03/31/16 1100 04/14/16 1100   Exercise Review   Progression   Yes Yes Yes   Response to Exercise   Blood Pressure (Admit)   102/64 mmHg     Blood Pressure (Exercise)   104/62 mmHg     Blood Pressure (Exit)   102/62 mmHg     Heart Rate (Admit)   93 bpm     Heart Rate (Exercise)   88 bpm     Heart Rate (Exit)   93 bpm     Oxygen Saturation (Admit)   95 %     Oxygen Saturation (Exercise)   93 %     Oxygen Saturation (Exit)   93 %     Rating of Perceived Exertion (Exercise) --  Ranged from 13-17 --  Ranged from 13-17 15     Perceived Dyspnea (Exercise) --  Ranged from 3-5 --  Ranged from 3-5 3     Symptoms   None     Duration Progress to 30 minutes of continuous aerobic without signs/symptoms of physical distress Progress to 30 minutes of continuous  aerobic without signs/symptoms of physical distress Progress to 50 minutes of aerobic without signs/symptoms of physical distress Progress to 50 minutes of aerobic without signs/symptoms of physical distress Progress to 50 minutes of aerobic without signs/symptoms of physical distress   Intensity Rest + 30 Rest + 30 Rest + 30 Rest + 30 Rest + 30   Progression   Progression Continue progressive overload as per policy without signs/symptoms or physical distress. Continue progressive overload as per policy without signs/symptoms or physical distress. Continue to progress workloads to maintain intensity without signs/symptoms of physical distress. Continue to progress workloads to maintain intensity without signs/symptoms of physical distress. Continue to progress workloads to maintain intensity without signs/symptoms of physical distress.   Resistance Training   Training Prescription Yes Yes Yes Yes Yes   Weight 1 1 1 1 1    Reps 10-15 10-15 10-12 10-12 10-12   Treadmill   MPH 1.1 1.1 1.1 1.2 1.3   Grade 0 0 0 0 0   Minutes 13 13 15 15 15    NuStep   Level 3 3 3 4 5    Watts 20 20 20 20 20    Minutes 15 15 15 15 15    Biostep-RELP   Level 3 5 3 3 3    Watts 13 15 15 15 15    Minutes 15 15 15 15 15      04/27/16 1300           Exercise Review   Progression Yes       Response to Exercise   Blood Pressure (Admit) 110/64 mmHg       Blood Pressure (Exercise) 132/72 mmHg       Blood Pressure (Exit) 108/64 mmHg       Heart Rate (Admit) 76 bpm       Heart Rate (Exercise) 104 bpm       Heart Rate (Exit) 94 bpm       Oxygen Saturation (Admit) 93 %       Oxygen Saturation (Exercise) 91 %       Oxygen Saturation (Exit) 94 %       Rating of Perceived Exertion (Exercise) 13       Perceived Dyspnea (Exercise) 2       Symptoms None       Duration Progress to 50 minutes of aerobic without signs/symptoms of physical distress       Intensity Rest + 30       Progression   Progression Continue to progress  workloads to maintain intensity without signs/symptoms of physical distress.       Average METs 2.1       Resistance Training   Training Prescription Yes       Weight 1       Reps 10-12       Interval Training   Interval Training No       Treadmill   MPH 1.5       Grade 1       Minutes 15       NuStep   Level 5       Watts 22       Minutes 15       Biostep-RELP   Level 3  Watts 15       Minutes 15       Home Exercise Plan   Plans to continue exercise at [employer] (comment)  [place]          Exercise Comments:     Exercise Comments      02/09/16 1121 02/23/16 1148 03/31/16 1124 04/12/16 1445 04/27/16 1341   Exercise Comments Reviewed individualized exercise prescription and made increases per departmental policy. Exercise increases were discussed with the patient and they were able to perform the new work loads without issue (no signs or symptoms).  Reviewed individualized exercise prescription and made increases per departmental policy. Exercise increases were discussed with the patient and they were able to perform the new work loads without issue (no signs or symptoms).  Roopa stated that she feels stronger and has more energy as she attends the [employer]. She increased her workload on the treadmill today and was "dancing to the music". When I told her "No dancing on the treadmill" She responded with"I feel like dancing, I am so much better since I started this exercise." Kairah stated that she has been feeling much better and has been much more active in her daily life. She has already stated her plans to continue exercise after pulmonary rehab. She has an exercise facility where she lives and wants to continue there 3 xper week.  Indiya will be graduating soon.  She has been doing great with increasing her workloads.  She will continue to exercise at the [facility] [employer]      Discharge Exercise Prescription (Final Exercise  Prescription Changes):     Exercise Prescription Changes - 04/27/16 1300    Exercise Review   Progression Yes   Response to Exercise   Blood Pressure (Admit) 110/64 mmHg   Blood Pressure (Exercise) 132/72 mmHg   Blood Pressure (Exit) 108/64 mmHg   Heart Rate (Admit) 76 bpm   Heart Rate (Exercise) 104 bpm   Heart Rate (Exit) 94 bpm   Oxygen Saturation (Admit) 93 %   Oxygen Saturation (Exercise) 91 %   Oxygen Saturation (Exit) 94 %   Rating of Perceived Exertion (Exercise) 13   Perceived Dyspnea (Exercise) 2   Symptoms None   Duration Progress to 50 minutes of aerobic without signs/symptoms of physical distress   Intensity Rest + 30   Progression   Progression Continue to progress workloads to maintain intensity without signs/symptoms of physical distress.   Average METs 2.1   Resistance Training   Training Prescription Yes   Weight 1   Reps 10-12   Interval Training   Interval Training No   Treadmill   MPH 1.5   Grade 1   Minutes 15   NuStep   Level 5   Watts 22   Minutes 15   Biostep-RELP   Level 3   Watts 15   Minutes 15   Home Exercise Plan   Plans to continue exercise at [employer] (comment)  [place]       Nutrition:  Target Goals: Understanding of nutrition guidelines, daily intake of sodium <1550m, cholesterol <2035m calories 30% from fat and 7% or less from saturated fats, daily to have 5 or more servings of fruits and vegetables.  Biometrics:    Nutrition Therapy Plan and Nutrition Goals:     Nutrition Therapy & Goals - 01/27/16 0900    Nutrition Therapy   Diet Ms KaMarceauxrefers not to meet  the dietian; she cooks healthy and eats small portion sizes      Nutrition Discharge: Rate Your Plate Scores:   Psychosocial: Target Goals: Acknowledge presence or absence of depression, maximize coping skills, provide positive support system. Participant is able to verbalize types and ability to use techniques and skills needed  for reducing stress and depression.  Initial Review & Psychosocial Screening:     Initial Psych Review & Screening - 01/27/16 0900    Family Dynamics   Good Support System? Yes   Comments Ms Kanno has good support from her husband and states she has no depression. She is disappointed with the long recovery from her pneumonia, but is looking forward to monitored exercise in [facility] and learning more aboput COPD.   Barriers   Psychosocial barriers to participate in program There are no identifiable barriers or psychosocial needs.;The patient should benefit from training in stress management and relaxation.   Screening Interventions   Interventions Encouraged to exercise      Quality of Life Scores:     Quality of Life - 04/28/16 1000    Quality of Life Scores   Health/Function Pre 29.25 %   Health/Function Post 29.21 %   Health/Function % Change -0.14 %   Socioeconomic Pre 30 %   Socioeconomic Post 28.21 %   Socioeconomic % Change  -5.97 %   Psych/Spiritual Pre 30 %   Psych/Spiritual Post 30 %   Psych/Spiritual % Change 0 %   Family Pre 30 %   Family Post 30 %   Family % Change 0 %   GLOBAL Pre 29.66 %   GLOBAL Post 29.29 %   GLOBAL % Change -1.25 %      PHQ-9:     Recent Review Flowsheet Data    Depression screen [facility] 2/9 04/28/2016 01/27/2016   Decreased Interest 0 0   Down, Depressed, Hopeless 0 0   PHQ - 2 Score 0 0   Altered sleeping 0 0   Tired, decreased energy 0 0   Change in appetite 0 0   Feeling bad or failure about yourself  0 0   Trouble concentrating 0 0   Suicidal thoughts 0 0   PHQ-9 Score 0 0   Difficult doing work/chores - Not difficult at all      Psychosocial Evaluation and Intervention:     Psychosocial Evaluation - 04/21/16 1121    Discharge Psychosocial Assessment & Intervention   Comments Counselor met with Ms. Loyola today prior to her discharging from this program.   She reports feeling much better since coming into this program and  is breathing better now.  She also states she made a significant improvement on her 6 minute walk and was excited about that progress as well.  Ms. Kotz reports she is sleeping well and feeling stronger overall.  She still has some memory issues, but they have not appeared to worsen while in this program.  Ms. Lisanti plans to work out with her spouse regularly as a follow up program since [place] gym [place].  Counselor commended Ms. Reader on her hard work and progress made.        Psychosocial Re-Evaluation:     Psychosocial Re-Evaluation      02/18/16 1100 03/24/16 1113 03/31/16 1126       Psychosocial Re-Evaluation   Comments Follow up with Ms. Weimann today reporting having a difficult time completing the workouts due to her "body  not cooperating."  She continues to have a positive mood and her memory appeared to be better today sharing places she has lived as a traveling [place] wife and how much she enjoys living in the retirement community.  Counselor will continue to follow with Ms. Ehrsam while in this program.  Counselor follow up with Ms. Moffat today stating she has finally begun to see some benefits from this program with increased strength, stamina and energy.  She also reports she is breathing better and "standing taller" as a result of consistently working out here.  Ms. Raliegh Ip states her mood and sleep remain good and she plans to work [place] with her [place] 3x/week upon completion of this program.  Counselor commended Ms. Bieker for her hard work and commitment to consistent exercise resulting in such positive benefits.   Quinita stated that she feels stronger and has more energy as she attends the [employer]. She increased her workload on the treadmill today and was "dancing to the music". When I told her "No dancing on the treadmill" She responded with"I feel like dancing, I am so much better since I started this  exercise.  [facility]       Education: Education Goals: Education classes will be provided on a weekly basis, covering required topics. Participant will state understanding/return demonstration of topics presented.  Learning Barriers/Preferences:     Learning Barriers/Preferences - 01/27/16 0900    Learning Barriers/Preferences   Learning Barriers None   Learning Preferences Group Instruction;Individual Instruction;Pictoral;Skilled Demonstration;Verbal Instruction;Video;Written Material      Education Topics: Initial Evaluation Education: - Verbal, written and demonstration of respiratory meds, RPE/PD scales, oximetry and breathing techniques. Instruction on use of nebulizers and MDIs: cleaning and proper use, rinsing mouth with steroid doses and importance of monitoring MDI activations.          Pulmonary Rehab from 04/26/2016 in [facility] Cardiac and Pulmonary Rehab   Date  01/27/16   Educator  LB   Instruction Review Code  2- meets goals/outcomes      General Nutrition Guidelines/Fats and Fiber: -Group instruction provided by verbal, written material, models and posters to present the general guidelines for heart healthy nutrition. Gives an explanation and review of dietary fats and fiber.      Pulmonary Rehab from 04/26/2016 in [facility] Cardiac and Pulmonary Rehab   Date  04/26/16   Educator  CR   Instruction Review Code  2- meets goals/outcomes      Controlling Sodium/Reading Food Labels: -Group verbal and written material supporting the discussion of sodium use in heart healthy nutrition. Review and explanation with models, verbal and written materials for utilization of the food label.   Exercise Physiology & Risk Factors: - Group verbal and written instruction with models to review the exercise physiology of the cardiovascular system and associated critical values. Details cardiovascular disease risk factors and the goals associated with each risk factor.   Aerobic Exercise &  Resistance Training: - Gives group verbal and written discussion on the health impact of inactivity. On the components of aerobic and resistive training programs and the benefits of this training and how to safely progress through these programs.   Flexibility, Balance, General Exercise Guidelines: - Provides group verbal and written instruction on the benefits of flexibility and balance training programs. Provides general exercise guidelines with specific guidelines to those with heart or lung disease. Demonstration and skill practice provided.      Pulmonary Rehab from 04/26/2016  in [facility] Cardiac and Pulmonary Rehab   Date  03/31/16   Educator  BS   Instruction Review Code  2- meets goals/outcomes      Stress Management: - Provides group verbal and written instruction about the health risks of elevated stress, cause of high stress, and healthy ways to reduce stress.      Pulmonary Rehab from 04/26/2016 in [facility] Cardiac and Pulmonary Rehab   Date  04/07/16   Educator  [facility]   Instruction Review Code  2- meets goals/outcomes      Depression: - Provides group verbal and written instruction on the correlation between heart/lung disease and depressed mood, treatment options, and the stigmas associated with seeking treatment.      Pulmonary Rehab from 04/26/2016 in [facility] Cardiac and Pulmonary Rehab   Date  02/25/16   Educator  [facility]   Instruction Review Code  2- meets goals/outcomes      Exercise & Equipment Safety: - Individual verbal instruction and demonstration of equipment use and safety with use of the equipment.      Pulmonary Rehab from 04/26/2016 in [facility] Cardiac and Pulmonary Rehab   Date  01/30/16   Educator  RM   Instruction Review Code  2- meets goals/outcomes      Infection Prevention: - Provides verbal and written material to individual with discussion of infection control including proper hand washing and proper equipment cleaning during exercise session.      Pulmonary Rehab from  04/26/2016 in [facility] Cardiac and Pulmonary Rehab   Date  01/30/16   Educator  RM   Instruction Review Code  2- meets goals/outcomes      Falls Prevention: - Provides verbal and written material to individual with discussion of falls prevention and safety.      Pulmonary Rehab from 04/26/2016 in [facility] Cardiac and Pulmonary Rehab   Date  01/27/16   Educator  LB   Instruction Review Code  2- meets goals/outcomes      Diabetes: - Individual verbal and written instruction to review signs/symptoms of diabetes, desired ranges of glucose level fasting, after meals and with exercise. Advice that pre and post exercise glucose checks will be done for 3 sessions at entry of program.   Chronic Lung Diseases: - Group verbal and written instruction to review new updates, new respiratory medications, new advancements in procedures and treatments. Provide informative websites and "800" numbers of self-education.      Pulmonary Rehab from 04/26/2016 in [facility] Cardiac and Pulmonary Rehab   Date  03/29/16   Educator  Carson Myrtle, RT   Instruction Review Code  2- meets goals/outcomes      Lung Procedures: - Group verbal and written instruction to describe testing methods done to diagnose lung disease. Review the outcome of test results. Describe the treatment choices: Pulmonary Function Tests, ABGs and oximetry.   Energy Conservation: - Provide group verbal and written instruction for methods to conserve energy, plan and organize activities. Instruct on pacing techniques, use of adaptive equipment and posture/positioning to relieve shortness of breath.   Triggers: - Group verbal and written instruction to review types of environmental controls: home humidity, furnaces, filters, dust mite/pet prevention, HEPA vacuums. To discuss weather changes, air quality and the benefits of nasal washing.      Pulmonary Rehab from 04/26/2016 in [facility] Cardiac and Pulmonary Rehab   Date  03/15/16   Educator  LB    Instruction Review Code  2- meets goals/outcomes      Exacerbations: -  Group verbal and written instruction to provide: warning signs, infection symptoms, calling MD promptly, preventive modes, and value of vaccinations. Review: effective airway clearance, coughing and/or vibration techniques. Create an Sports administrator.      Pulmonary Rehab from 04/26/2016 in [facility] Cardiac and Pulmonary Rehab   Date  02/02/16   Educator  LB   Instruction Review Code  2- meets goals/outcomes      Oxygen: - Individual and group verbal and written instruction on oxygen therapy. Includes supplement oxygen, available portable oxygen systems, continuous and intermittent flow rates, oxygen safety, concentrators, and Medicare reimbursement for oxygen.   Respiratory Medications: - Group verbal and written instruction to review medications for lung disease. Drug class, frequency, complications, importance of spacers, rinsing mouth after steroid MDI's, and proper cleaning methods for nebulizers.      Pulmonary Rehab from 04/26/2016 in [facility] Cardiac and Pulmonary Rehab   Date  01/27/16   Educator  LB   Instruction Review Code  2- meets goals/outcomes      AED/CPR: - Group verbal and written instruction with the use of models to demonstrate the basic use of the AED with the basic ABC's of resuscitation.      Pulmonary Rehab from 04/26/2016 in [facility] Cardiac and Pulmonary Rehab   Date  04/09/16   Educator  CE   Instruction Review Code  2- meets goals/outcomes      Breathing Retraining: - Provides individuals verbal and written instruction on purpose, frequency, and proper technique of diaphragmatic breathing and pursed-lipped breathing. Applies individual practice skills.      Pulmonary Rehab from 04/26/2016 in [facility] Cardiac and Pulmonary Rehab   Date  01/30/16   Educator  [place]   Instruction Review Code  2- meets goals/outcomes      Anatomy and Physiology of the Lungs: - Group verbal and written instruction with the use  of models to provide basic lung anatomy and physiology related to function, structure and complications of lung disease.      Pulmonary Rehab from 04/26/2016 in [facility] Cardiac and Pulmonary Rehab   Date  02/13/16   Educator  Erline Levine RRT   Instruction Review Code  2- meets goals/outcomes      Heart Failure: - Group verbal and written instruction on the basics of heart failure: signs/symptoms, treatments, explanation of ejection fraction, enlarged heart and cardiomyopathy.   Sleep Apnea: - Individual verbal and written instruction to review Obstructive Sleep Apnea. Review of risk factors, methods for diagnosing and types of masks and machines for OSA.   Anxiety: - Provides group, verbal and written instruction on the correlation between heart/lung disease and anxiety, treatment options, and management of anxiety.   Relaxation: - Provides group, verbal and written instruction about the benefits of relaxation for patients with heart/lung disease. Also provides patients with examples of relaxation techniques.      Pulmonary Rehab from 04/26/2016 in [facility] Cardiac and Pulmonary Rehab   Date  04/21/16   Educator  Kathreen Cornfield, [facility]   Instruction Review Code  2- Meets goals/outcomes      Knowledge Questionnaire Score:     Knowledge Questionnaire Score - 01/27/16 0900    Knowledge Questionnaire Score   Pre Score 6/10       Core Components/Risk Factors/Patient Goals at Admission:     Personal Goals and Risk Factors at Admission - 01/27/16 0900    Core Components/Risk Factors/Patient Goals on Admission   Sedentary Yes   Intervention Provide advice, education, support and counseling about physical  activity/exercise needs.;Develop an individualized exercise prescription for aerobic and resistive training based on initial evaluation findings, risk stratification, comorbidities and participant's personal goals.  [name] had pneumonia in December and has a long recovery period. She previously  exercised at the [facility] and is looking forward to return to her exercise at their facility.   Expected Outcomes Achievement of increased cardiorespiratory fitness and enhanced flexibility, muscular endurance and strength shown through measurements of functional capaciy and personal statement of participant.   Improve shortness of breath with ADL's Yes   Intervention Provide education, individualized exercise plan and daily activity instruction to help decrease symptoms of SOB with activities of daily living.  Ms holst rates stairs and hills a "3" on shortness of breath questionnaire.   Expected Outcomes Short Term: Achieves a reduction of symptoms when performing activities of daily living.   Develop more efficient breathing techniques such as purse lipped breathing and diaphragmatic breathing; and practicing self-pacing with activity Yes   Intervention Provide education, demonstration and support about specific breathing techniuqes utilized for more efficient breathing. Include techniques such as pursed lipped breathing, diaphragmatic breathing and self-pacing activity.   Expected Outcomes Short Term: Participant will be able to demonstrate and use breathing techniques as needed throughout daily activities.   Increase knowledge of respiratory medications and ability to use respiratory devices properly  Yes   Intervention Provide education and demonstration as needed of appropriate use of medications, inhalers, and oxygen therapy.  Ms Hughson takes Spiriva and wears 2l/m [place] at night with sleep. She is being tested this week to see if she still needs the oxygen at night.   Expected Outcomes Short Term: Achieves understanding of medications use. Understands that oxygen is a medication prescribed by physician. Demonstrates appropriate use of inhaler and oxygen therapy.      Core Components/Risk Factors/Patient Goals Review:      Goals and Risk Factor Review      01/30/16 1243  02/04/16 1509 02/06/16 1343 02/06/16 1348 02/23/16 1000   Core Components/Risk Factors/Patient Goals Review   Personal Goals Review Develop more efficient breathing techniques such as purse lipped breathing and diaphragmatic breathing and practicing self-pacing with activity. Sedentary Improve shortness of breath with ADL's Increase knowledge of respiratory medications and ability to use respiratory devices properly. Improve shortness of breath with ADL's;Develop more efficient breathing techniques such as purse lipped breathing and diaphragmatic breathing and practicing self-pacing with activity.   Review Reviewed PLB with Ms. Keltner today. Ms Ortloff has increased her time with her exercise goals on the NS and [employer]. She has become more knowledgeable in setting up the equipment and does want to progress with her exercise goals. Ms Grega does want to continue exercising at the [place] gym with her husband. Ms. Makar says that walking causes her to have the most SOB.  Reviewed PLB with her and talked about taking rest periods while walking and shopping. Ms. Worrall says that she is comfortable with the respiratory medications she is taking.  She has no questions at this time and does not need a spcer with the medicines that she is currently taking. Ms Parmar states she is feeling so much better - she is breathing better with activites at home and is walking faster. She is looking forward to getting back to [place] and exercise again with her husband.   Expected Outcomes She will use this technique while exercising to be able to exercise for longer periods of  time. Continue improving her strength and stamina to add one extra day at the [facility]. Increase in distance she can walk and being able to shop longer. Taking her medications as prescribed should help decreased her SOB.      02/27/16 1238 03/30/16 0746 04/09/16 1624 04/09/16 1626 04/09/16 1627   Core  Components/Risk Factors/Patient Goals Review   Personal Goals Review Increase knowledge of respiratory medications and ability to use respiratory devices properly. Increase Strength and Stamina;Sedentary;Develop more efficient breathing techniques such as purse lipped breathing and diaphragmatic breathing and practicing self-pacing with activity.;Improve shortness of breath with ADL's;Increase knowledge of respiratory medications and ability to use respiratory devices properly. Increase knowledge of respiratory medications and ability to use respiratory devices properly. Develop more efficient breathing techniques such as purse lipped breathing and diaphragmatic breathing and practicing self-pacing with activity. Improve shortness of breath with ADL's   Review Ms. Weatherbee takes Spiriva for her breathing at home.  She understands how to use the device and when she is to take her medication. Ms Bagg attends very regularly and is dedicated to regaining her strength after her exacerbation with hospitalization. Her goalis to return to the [employer] [facility] [employer] and exercise with he husband. She has  gradually increased her exercise goal time to 37mns on eac h piece of equipment. She is doing more things at home and getting out more with less shortness of breath and manages this all with pacing . Spiriva is the only inhaler she uses and she does have a good understanding of the medicine and is compliant. Ms. KGlocktakes Spiriva and wears 2 lpm O2 at HS.   Reviewed PLB with Ms. KBlystone  She has a good understanding of the technique Ms. KCibriansays that she still has good and bad breathing days, but she feels that she can walk farther distances without stopping since she has been exercising.   Expected Outcomes Taking her medication as prescribed will help decrease her SOB.  Ms. KRaetherseems to have a good understanding of her respiratory medication and O2.  Using these correctly will reduces her chances of  hospitalization and exacerbations. This will reduce her SOB while exercising and on exerction. Hopefully she will continue to build her endurance and find other activities easier to perform.     04/12/16 1444 04/14/16 1038 04/28/16 1000       Core Components/Risk Factors/Patient Goals Review   Personal Goals Review Sedentary Increase Strength and Stamina Sedentary;Increase Strength and Stamina;Improve shortness of breath with ADL's;Develop more efficient breathing techniques such as purse lipped breathing and diaphragmatic breathing and practicing self-pacing with activity.;Increase knowledge of respiratory medications and ability to use respiratory devices properly.     Review see exercise comments AYaseniaimproved her walk distance 245 ft or 28.6% from her intake assessment.  This exceeds the expected goal of improving 98.4 feet. Ms KTroutmanhas one more session and then will graduate. She has improved her 689m by [id number] Minimal Importance Difference for COPD is 98.4ft72fhe had lost much of her stamina and strength during her illness last year, but has improved and increased her exercise goals with the plan [facility]  She has a good awareness of her MDIs, PLB, pacing, and management of her daily life activites for COPD .      Expected Outcomes  Pernell Taranehl maintain the improved stength and stamina she has gained.  Core Components/Risk Factors/Patient Goals at Discharge (Final Review):      Goals and Risk Factor Review - 04/28/16 1000    Core Components/Risk Factors/Patient Goals Review   Personal Goals Review Sedentary;Increase Strength and Stamina;Improve shortness of breath with ADL's;Develop more efficient breathing techniques such as purse lipped breathing and diaphragmatic breathing and practicing self-pacing with activity.;Increase knowledge of respiratory medications and ability to use respiratory devices properly.    Review Ms Cowdery has one more session and then will graduate. She has improved her 39md by 2477f- Minimal Importance Difference for COPD is 98.58f61fShe had lost much of her stamina and strength during her illness last year, but has improved and increased her exercise goals with the plan to [facility]  She has a good awareness of her MDIs, PLB, pacing, and management of her daily life activites for COPD .       ITP Comments:     ITP Comments      01/30/16 1044           ITP Comments Personal and exercise goals expected to be met in 34 more sessions. Progress on specific individualized goals will be charted in patient's ITP. Upon completion of the program the patient will be comfortable managing exercise goals and progression on their own.           Comments:  Ms KarVanluell graduate soon and [facility]

## 2016-04-30 ENCOUNTER — Encounter: Payer: Medicare Other | Admitting: *Deleted

## 2016-04-30 DIAGNOSIS — J449 Chronic obstructive pulmonary disease, unspecified: Secondary | ICD-10-CM

## 2016-04-30 NOTE — Progress Notes (Signed)
Daily Session Note  Patient Details  Name: Cheryl Cannon MRN: [id number] Date of Birth: 11-Aug-1936 Referring Provider:        Pulmonary Rehab from 04/28/2016 in [facility] Cardiac and Pulmonary Rehab   Referring Provider  Ancil Linsey MD      Encounter Date: 04/30/2016  Check In:     Session Check In - 04/30/16 1155    Check-In   Location [facility]-Cardiac & Pulmonary Rehab   Staff Present Alberteen Sam, MA, ACSM RCEP, Exercise Physiologist;Susanne Bice, RN, BSN, CCRP;Carroll Enterkin, RN, BSN;Other   Supervising physician immediately available to respond to emergencies [facility] immediately available ER MD   Physician(s) Burlene Arnt and [place]   Medication changes reported     No   Fall or balance concerns reported    No   Warm-up and Cool-down Performed on first and last piece of equipment   Resistance Training Performed Yes   VAD Patient? No   Pain Assessment   Currently in Pain? No/denies   Multiple Pain Sites No         Goals Met:  Proper associated with RPD/PD & O2 Sat [place] with exercise equipment Improved SOB with ADL's Using PLB without cueing & demonstrates good technique Exercise tolerated well Personal goals reviewed Strength training completed today  Goals Unmet:  Not Applicable  Comments:  Cheryl Cannon graduated today from cardiac rehab with 36 sessions completed.  Details of the patient's exercise prescription and what She needs to do in order to continue the prescription and progress.  Patient verbalized understanding.  Cheryl Cannon plans to continue to exercise by going to gym at [facility].    Dr. Emily Filbert is Medical Director for [place] and [facility] Pulmonary Rehabilitation.

## 2016-04-30 NOTE — Progress Notes (Signed)
Discharge Summary  Patient Details  Name: Cheryl Cannon MRN: [id number] Date of Birth: 06/26/1936 Referring Provider:        Pulmonary Rehab from 04/28/2016 in [facility]   Referring Provider  Ancil Linsey MD       Number of Visits: 36  Reason for Discharge:  Patient reached a stable level of exercise. Patient independent in their exercise.  Smoking History:  History  Smoking status  . Former Smoker -- 1.00 packs/day for 44 years  . Quit date: 11/22/1998  Smokeless tobacco  . Not on file    Diagnosis:  COPD, moderate ([place])  ADL UCSD:     Pulmonary Assessment Scores      01/27/16 0900 04/28/16 1558     ADL UCSD   ADL Phase Entry Exit    SOB Score total 10 11    Rest 0 0    Walk 0 0    Stairs 3 2    Bath 0 0    Dress 0 0    Shop 0 1       Initial Exercise Prescription:     Initial Exercise Prescription - 04/28/16 1400    Date of Initial Exercise RX and Referring Provider   Referring Provider Ancil Linsey MD      Discharge Exercise Prescription (Final Exercise Prescription Changes):     Exercise Prescription Changes - 04/27/16 1300    Exercise Review   Progression Yes   Response to Exercise   Blood Pressure (Admit) 110/64 mmHg   Blood Pressure (Exercise) 132/72 mmHg   Blood Pressure (Exit) 108/64 mmHg   Heart Rate (Admit) 76 bpm   Heart Rate (Exercise) 104 bpm   Heart Rate (Exit) 94 bpm   Oxygen Saturation (Admit) 93 %   Oxygen Saturation (Exercise) 91 %   Oxygen Saturation (Exit) 94 %   Rating of Perceived Exertion (Exercise) 13   Perceived Dyspnea (Exercise) 2   Symptoms None   Duration Progress to 50 minutes of aerobic without signs/symptoms of physical distress   Intensity Rest + 30   Progression   Progression Continue to progress workloads to maintain intensity without signs/symptoms of physical distress.   Average METs 2.1   Resistance Training   Training Prescription Yes   Weight 1   Reps 10-12   Interval  Training   Interval Training No   Treadmill   MPH 1.5   Grade 1   Minutes 15   NuStep   Level 5   Watts 22   Minutes 15   Biostep-RELP   Level 3   Watts 15   Minutes 15   Home Exercise Plan   Plans to continue exercise at [employer] (comment)  [place] [facility]      Functional Capacity:     6 Minute Walk      01/27/16 1254 03/10/16 1000 04/14/16 1032   6 Minute Walk   Phase Initial Mid Program Discharge   Distance 855 feet 830 feet 1100 feet   Distance % Change   28.6 %   Walk Time 6 minutes 6 minutes 6 minutes   # of Rest Breaks 0 0 0   RPE 13  13   Perceived Dyspnea  3  3.5   Symptoms No     Resting HR 71 bpm 79 bpm 77 bpm   Resting BP 106/64 mmHg 122/68 mmHg 120/60 mmHg   Max Ex. HR 91 bpm 74 bpm 101 bpm  Max Ex. BP 124/72 mmHg 130/68 mmHg 112/66 mmHg      Psychological, QOL, Others - Outcomes: PHQ 2/9: Depression screen [facility] 2/9 04/28/2016 01/27/2016  Decreased Interest 0 0  Down, Depressed, Hopeless 0 0  PHQ - 2 Score 0 0  Altered sleeping 0 0  Tired, decreased energy 0 0  Change in appetite 0 0  Feeling bad or failure about yourself  0 0  Trouble concentrating 0 0  Suicidal thoughts 0 0  PHQ-9 Score 0 0  Difficult doing work/chores - Not difficult at all    Quality of Life:     Quality of Life - 04/28/16 1000    Quality of Life Scores   Health/Function Pre 29.25 %   Health/Function Post 29.21 %   Health/Function % Change -0.14 %   Socioeconomic Pre 30 %   Socioeconomic Post 28.21 %   Socioeconomic % Change  -5.97 %   Psych/Spiritual Pre 30 %   Psych/Spiritual Post 30 %   Psych/Spiritual % Change 0 %   Family Pre 30 %   Family Post 30 %   Family % Change 0 %   GLOBAL Pre 29.66 %   GLOBAL Post 29.29 %   GLOBAL % Change -1.25 %      Personal Goals: Goals established at orientation with interventions provided to work toward goal.     Personal Goals and Risk Factors at Admission - 01/27/16 0900    Core Components/Risk  Factors/Patient Goals on Admission   Sedentary Yes   Intervention Provide advice, education, support and counseling about physical activity/exercise needs.;Develop an individualized exercise prescription for aerobic and resistive training based on initial evaluation findings, risk stratification, comorbidities and participant's personal goals.  Cheryl Cannon had pneumonia in December and has a long recovery period. She previously exercised at the [facility] and is looking forward to return to her exercise at their facility.   Expected Outcomes Achievement of increased cardiorespiratory fitness and enhanced flexibility, muscular endurance and strength shown through measurements of functional capaciy and personal statement of participant.   Improve shortness of breath with ADL's Yes   Intervention Provide education, individualized exercise plan and daily activity instruction to help decrease symptoms of SOB with activities of daily living.  Cheryl Cannon rates stairs and hills a "3" on shortness of breath questionnaire.   Expected Outcomes Short Term: Achieves a reduction of symptoms when performing activities of daily living.   Develop more efficient breathing techniques such as purse lipped breathing and diaphragmatic breathing; and practicing self-pacing with activity Yes   Intervention Provide education, demonstration and support about specific breathing techniuqes utilized for more efficient breathing. Include techniques such as pursed lipped breathing, diaphragmatic breathing and self-pacing activity.   Expected Outcomes Short Term: Participant will be able to demonstrate and use breathing techniques as needed throughout daily activities.   Increase knowledge of respiratory medications and ability to use respiratory devices properly  Yes   Intervention Provide education and demonstration as needed of appropriate use of medications, inhalers, and oxygen therapy.  Cheryl Cannon takes Spiriva and  wears 2l/m [place] at night with sleep. She is being tested this week to see if she still needs the oxygen at night.   Expected Outcomes Short Term: Achieves understanding of medications use. Understands that oxygen is a medication prescribed by physician. Demonstrates appropriate use of inhaler and oxygen therapy.       Personal Goals Discharge:     Goals and Risk Factor Review  01/30/16 1243 02/04/16 1509 02/06/16 1343 02/06/16 1348 02/23/16 1000   Core Components/Risk Factors/Patient Goals Review   Personal Goals Review Develop more efficient breathing techniques such as purse lipped breathing and diaphragmatic breathing and practicing self-pacing with activity. Sedentary Improve shortness of breath with ADL's Increase knowledge of respiratory medications and ability to use respiratory devices properly. Improve shortness of breath with ADL's;Develop more efficient breathing techniques such as purse lipped breathing and diaphragmatic breathing and practicing self-pacing with activity.   Review Reviewed PLB with Cheryl. Silvester today. Cheryl Spoonemore has increased her time with her exercise goals on the NS and BioStep. She has become more knowledgeable in setting up the equipment and does want to progress with her exercise goals. Cheryl Sliva does want to continue exercising at the [place] gym with her husband. Cheryl. Kondo says that walking causes her to have the most SOB.  Reviewed PLB with her and talked about taking rest periods while walking and shopping. Cheryl. Ragan says that she is comfortable with the respiratory medications she is taking.  She has no questions at this time and does not need a spcer with the medicines that she is currently taking. Cheryl Sweezy states she is feeling so much better - she is breathing better with activites at home and is walking faster. She is looking forward to getting back to [place] and exercise again with her husband.   Expected Outcomes She will use  this technique while exercising to be able to exercise for longer periods of time. Continue improving her strength and stamina to add one extra day at the [facility]. Increase in distance she can walk and being able to shop longer. Taking her medications as prescribed should help decreased her SOB.      02/27/16 1238 03/30/16 0746 04/09/16 1624 04/09/16 1626 04/09/16 1627   Core Components/Risk Factors/Patient Goals Review   Personal Goals Review Increase knowledge of respiratory medications and ability to use respiratory devices properly. Increase Strength and Stamina;Sedentary;Develop more efficient breathing techniques such as purse lipped breathing and diaphragmatic breathing and practicing self-pacing with activity.;Improve shortness of breath with ADL's;Increase knowledge of respiratory medications and ability to use respiratory devices properly. Increase knowledge of respiratory medications and ability to use respiratory devices properly. Develop more efficient breathing techniques such as purse lipped breathing and diaphragmatic breathing and practicing self-pacing with activity. Improve shortness of breath with ADL's   Review Cheryl. Fajardo takes Spiriva for her breathing at home.  She understands how to use the device and when she is to take her medication. Cheryl Wernli attends very regularly and is dedicated to regaining her strength after her exacerbation with hospitalization. Her goalis to return to the [employer] [facility] [employer] and exercise with he husband. She has  gradually increased her exercise goal time to 61mns on eac h piece of equipment. She is doing more things at home and getting out more with less shortness of breath and manages this all with pacing . Spiriva is the only inhaler she uses and she does have a good understanding of the medicine and is compliant. Cheryl. KIvanofftakes Spiriva and wears 2 lpm O2 at HS.   Reviewed PLB with Cheryl. KHubers  She has a good understanding of the  technique Cheryl. KBronkemasays that she still has good and bad breathing days, but she feels that she can walk farther distances without stopping since she has been exercising.   Expected Outcomes Taking her medication  as prescribed will help decrease her SOB.  Cheryl. Millman seems to have a good understanding of her respiratory medication and O2.  Using these correctly will reduces her chances of hospitalization and exacerbations. This will reduce her SOB while exercising and on exerction. Hopefully she will continue to build her endurance and find other activities easier to perform.     04/12/16 1444 04/14/16 1038 04/28/16 1000       Core Components/Risk Factors/Patient Goals Review   Personal Goals Review Sedentary Increase Strength and Stamina Sedentary;Increase Strength and Stamina;Improve shortness of breath with ADL's;Develop more efficient breathing techniques such as purse lipped breathing and diaphragmatic breathing and practicing self-pacing with activity.;Increase knowledge of respiratory medications and ability to use respiratory devices properly.     Review see exercise comments Ahlana improved her walk distance 245 ft or 28.6% from her intake assessment.  This exceeds the expected goal of improving 98.4 feet. Cheryl Adel has one more session and then will graduate. She has improved her 26md by [id number]- Minimal Importance Difference for COPD is 98.3f20fShe had lost much of her stamina and strength during her illness last year, but has improved and increased her exercise goals with the plan to [facility]  She has a good awareness of her MDIs, PLB, pacing, and management of her daily life activites for COPD .      Expected Outcomes  AnnSavanahll maintain the improved stength and stamina she has gained.         Nutrition & Weight - Outcomes:    Nutrition:     Nutrition Therapy & Goals - 01/27/16 0900    Nutrition Therapy   Diet Cheryl  KarAsebedoefers not to meet the dietian; she cooks healthy and eats small portion sizes      Nutrition Discharge:   Education Questionnaire Score:     Knowledge Questionnaire Score - 01/27/16 0900    Knowledge Questionnaire Score   Pre Score 6/10      Goals reviewed with patient; copy given to patient.

## 2016-05-03 NOTE — Progress Notes (Signed)
Pulmonary Individual Treatment Plan  Patient Details  Name: Cheryl Cannon MRN: [id number] Date of Birth: Mar 30, 1936 Referring Provider:        Pulmonary Rehab from 04/28/2016 in [facility] Cardiac and Pulmonary Rehab   Referring Provider  Ancil Linsey MD      Initial Encounter Date:       Pulmonary Rehab from 04/28/2016 in [facility] Cardiac and Pulmonary Rehab   Referring Provider  Ancil Linsey MD      Visit Diagnosis: COPD, moderate ([place])  Patient's Home Medications on Admission:  Current outpatient prescriptions:    ALPRAZolam (XANAX) 0.25 MG tablet, Take 0.25 mg by mouth daily as needed for anxiety. , Disp: , Rfl:    apixaban (ELIQUIS) 5 MG TABS tablet, Take 1 tablet (5 mg total) by mouth 2 (two) times daily., Disp: 60 tablet, Rfl: 0   cholecalciferol (VITAMIN D) 1000 UNITS tablet, Take 2,000 Units by mouth daily. , Disp: , Rfl:    cyanocobalamin (,VITAMIN B-12,) 1000 MCG/ML injection, Inject 1,000 mcg into the muscle every 30 (thirty) days. Injection once a month, Disp: , Rfl:    docusate sodium (COLACE) 100 MG capsule, Take 100 mg by mouth 2 (two) times daily., Disp: , Rfl:    donepezil (ARICEPT) 10 MG tablet, Take 1 tablet (10 mg total) by mouth at bedtime., Disp: 90 tablet, Rfl: 3   flecainide (TAMBOCOR) 50 MG tablet, Take 1.5 mg by mouth 2 (two) times daily., Disp: , Rfl:    furosemide (LASIX) 20 MG tablet, Take 1 tablet (20 mg total) by mouth daily., Disp: 30 tablet, Rfl: 0   gabapentin (NEURONTIN) 300 MG capsule, TAKE 4 CAPSULES IN THE MORNING AND 4 CAPSULES IN THE EVENING, Disp: 720 capsule, Rfl: 1   gabapentin (NEURONTIN) 300 MG capsule, Take 1,200 mg by mouth 2 (two) times daily., Disp: , Rfl:    levalbuterol (XOPENEX HFA) 45 MCG/ACT inhaler, Inhale 2 puffs into the lungs every 4 (four) hours as needed for wheezing., Disp: , Rfl:    metoprolol tartrate (LOPRESSOR) 25 MG tablet, Take 12.5 mg by mouth., Disp: , Rfl:    pantoprazole (PROTONIX) 40 MG tablet, Take 40 mg by  mouth daily. , Disp: , Rfl:    tiotropium (SPIRIVA) 18 MCG inhalation capsule, Place 18 mcg into inhaler and inhale daily. , Disp: , Rfl:   Past Medical History: Past Medical History  Diagnosis Date   Lung cancer ([place])    COPD (chronic obstructive pulmonary disease) ([place])    Cancer ([place])    Memory deficit    Celiac disease    A-fib ([place])     Tobacco Use: History  Smoking status   Former Smoker -- 1.00 packs/day for 38 years   Quit date: 11/22/1998  Smokeless tobacco   Not on file    Labs: Recent Review Flowsheet Data    Labs for ITP Cardiac and Pulmonary Rehab Latest Ref Rng 11/11/2015   Hemoglobin A1c 4.0 - 6.0 % 5.3       ADL UCSD:     Pulmonary Assessment Scores      01/27/16 0900 04/28/16 1558     ADL UCSD   ADL Phase Entry Exit    SOB Score total 10 11    Rest 0 0    Walk 0 0    Stairs 3 2    Bath 0 0    Dress 0 0    Shop 0 1       Pulmonary Function Assessment:  Pulmonary Function Assessment - 01/27/16 0900    Pulmonary Function Tests   RV% 88 %   DLCO% 58 %   Initial Spirometry Results   FVC% 92 %  Test date2/11/2014   FEV1% 74 %   FEV1/FVC Ratio 61   Post Bronchodilator Spirometry Results   FVC% 92 %   FEV1% 74 %   FEV1/FVC Ratio 61   Breath   Bilateral Breath Sounds Clear;Decreased  Decreased on right - lobectemy mid and lower sections   Shortness of Breath No      Exercise Target Goals:    Exercise Program Goal: Individual exercise prescription set with THRR, safety & activity barriers. Participant demonstrates ability to understand and report RPE using BORG scale, to self-measure pulse accurately, and to acknowledge the importance of the exercise prescription.  Exercise Prescription Goal: Starting with aerobic activity 30 plus minutes a day, 3 days per week for initial exercise prescription. Provide home exercise prescription and guidelines that participant acknowledges understanding prior to discharge.  Activity  Barriers & Risk Stratification:     Activity Barriers & Cardiac Risk Stratification - 01/27/16 0900    Activity Barriers & Cardiac Risk Stratification   Activity Barriers Deconditioning   Cardiac Risk Stratification Low      6 Minute Walk:     6 Minute Walk      01/27/16 1254 03/10/16 1000 04/14/16 1032   6 Minute Walk   Phase Initial Mid Program Discharge   Distance 855 feet 830 feet 1100 feet   Distance % Change   28.6 %   Walk Time 6 minutes 6 minutes 6 minutes   # of Rest Breaks 0 0 0   RPE 13  13   Perceived Dyspnea  3  3.5   Symptoms No     Resting HR 71 bpm 79 bpm 77 bpm   Resting BP 106/64 mmHg 122/68 mmHg 120/60 mmHg   Max Ex. HR 91 bpm 74 bpm 101 bpm   Max Ex. BP 124/72 mmHg 130/68 mmHg 112/66 mmHg      Initial Exercise Prescription:     Initial Exercise Prescription - 04/28/16 1400    Date of Initial Exercise RX and Referring Provider   Referring Provider Ancil Linsey MD      Perform Capillary Blood Glucose checks as needed.  Exercise Prescription Changes:     Exercise Prescription Changes      01/30/16 1000 02/02/16 1500 02/04/16 1000 02/06/16 1100 02/09/16 1100   Exercise Review   Progression Yes Yes Yes Yes Yes   Response to Exercise   Blood Pressure (Admit) 134/64 mmHg       Blood Pressure (Exercise) 120/62 mmHg       Blood Pressure (Exit) 118/70 mmHg       Heart Rate (Admit) 75 bpm       Heart Rate (Exercise) 104 bpm       Heart Rate (Exit) 95 bpm       Oxygen Saturation (Admit) 94 %       Oxygen Saturation (Exercise) 92 %       Oxygen Saturation (Exit) 96 %       Rating of Perceived Exertion (Exercise) 15       Perceived Dyspnea (Exercise) 3       Symptoms None None None None None   Comments First day of exercise! Patient was oriented to the gym and the equipment functions and settings. Procedures and policies of the gym were outlined and explained. The  patient's individual exercise prescription and treatment plan were reviewed with  them. All starting workloads were established based on the results of the functional testing  done at the initial intake visit. The plan for exercise progression was also introduced and progression will be customized based on the patient's performance and goals.  BX was too difficult for Cheryl Cannon, so she was changed to the BioStep and did tolerate this goals well. Reviewed individualized exercise prescription and made increases per departmental policy. Exercise increases were discussed with the patient and they were able to perform the new work loads without issue (no signs or symptoms).  Increased time on Biostep machine to further progress patient's stamina. Patient complied with increase and demonstrated ability.     Duration Progress to 30 minutes of continuous aerobic without signs/symptoms of physical distress  Progress to 30 minutes of continuous aerobic without signs/symptoms of physical distress Progress to 30 minutes of continuous aerobic without signs/symptoms of physical distress Progress to 30 minutes of continuous aerobic without signs/symptoms of physical distress   Intensity Rest + 30 Rest + 30 Rest + 30 Rest + 30 Rest + 30   Progression   Progression Continue progressive overload as per policy without signs/symptoms or physical distress. Continue progressive overload as per policy without signs/symptoms or physical distress. Continue progressive overload as per policy without signs/symptoms or physical distress. Continue progressive overload as per policy without signs/symptoms or physical distress. Continue progressive overload as per policy without signs/symptoms or physical distress.   Resistance Training   Training Prescription Yes Yes Yes Yes Yes   Weight 1 1 1 1 1    Reps 10-15 10-15 10-15 10-15 10-15   Treadmill   MPH 1.5 1.1 1.1 1.1 1.1   Grade 0 0 0 0 0   Minutes 10 10 10 10 10    Recumbant Bike   Level 2       RPM 40       Watts 20       Minutes 10       NuStep   Level 2  2 2 2 3    Watts 40 16 16 16 30    Minutes 10 10 12 12 15    Arm Ergometer   Level 1       Watts 10       Minutes 10       Recumbant Elliptical   Level 1       RPM 40       Watts 20       Minutes 10       REL-XR   Level 2       Watts 40       Minutes 10       T5 Nustep   Level 1       Watts 15       Minutes 10       Biostep-RELP   Level 2 1 1 1 1    Watts 40 15 15 15 15    Minutes 10 10 10 15 15      02/13/16 1000 02/16/16 1000 02/23/16 1100 02/25/16 1000 03/08/16 1300   Exercise Review   Progression Yes Yes Yes Yes    Response to Exercise   Blood Pressure (Admit)     110/60 mmHg   Blood Pressure (Exercise)     120/62 mmHg   Blood Pressure (Exit)     122/70 mmHg   Heart Rate (Admit)     70 bpm  Heart Rate (Exercise)     68 bpm   Heart Rate (Exit)     61 bpm   Oxygen Saturation (Admit)     93 %   Oxygen Saturation (Exercise)     91 %   Oxygen Saturation (Exit)     94 %   Rating of Perceived Exertion (Exercise)     17  Ranged from 13-17   Perceived Dyspnea (Exercise)     5  Ranged from 3-5   Symptoms None None None None None   Comments  Reviewed individualized exercise prescription and made increases per departmental policy. Exercise increases were discussed with the patient and they were able to perform the new work loads without issue (no signs or symptoms).  Reviewed individualized exercise prescription and made increases per departmental policy. Exercise increases were discussed with the patient and they were able to perform the new work loads without issue (no signs or symptoms).  Reviewed individualized exercise prescription and made increases per departmental policy. Exercise increases were discussed with the patient and they were able to perform the new work loads without issue (no signs or symptoms).     Duration Progress to 30 minutes of continuous aerobic without signs/symptoms of physical distress Progress to 30 minutes of continuous aerobic without signs/symptoms of  physical distress Progress to 30 minutes of continuous aerobic without signs/symptoms of physical distress Progress to 30 minutes of continuous aerobic without signs/symptoms of physical distress Progress to 30 minutes of continuous aerobic without signs/symptoms of physical distress   Intensity Rest + 30 Rest + 30 Rest + 30 Rest + 30 Rest + 30   Progression   Progression Continue progressive overload as per policy without signs/symptoms or physical distress. Continue progressive overload as per policy without signs/symptoms or physical distress. Continue progressive overload as per policy without signs/symptoms or physical distress. Continue progressive overload as per policy without signs/symptoms or physical distress. Continue progressive overload as per policy without signs/symptoms or physical distress.   Resistance Training   Training Prescription Yes Yes Yes Yes Yes   Weight 1 1 1 1 1    Reps 10-15 10-15 10-15 10-15 10-15   Treadmill   MPH 1.1 1.1 1.1 1.1 1.1   Grade 0 0 0 0 0   Minutes 11 12 13 13 13    NuStep   Level 3 3 3 3 3    Watts 30 30 30 30 20    Minutes 15 15 15 15 15    Biostep-RELP   Level 1 1 1 1 2    Watts 15 15 15 15 13    Minutes 15 15 15 15 15      03/10/16 1100 03/12/16 1100 03/22/16 1600 03/31/16 1100 04/14/16 1100   Exercise Review   Progression   Yes Yes Yes   Response to Exercise   Blood Pressure (Admit)   102/64 mmHg     Blood Pressure (Exercise)   104/62 mmHg     Blood Pressure (Exit)   102/62 mmHg     Heart Rate (Admit)   93 bpm     Heart Rate (Exercise)   88 bpm     Heart Rate (Exit)   93 bpm     Oxygen Saturation (Admit)   95 %     Oxygen Saturation (Exercise)   93 %     Oxygen Saturation (Exit)   93 %     Rating of Perceived Exertion (Exercise) --  Ranged from 13-17 --  Ranged from 13-17 15  Perceived Dyspnea (Exercise) --  Ranged from 3-5 --  Ranged from 3-5 3     Symptoms   None     Duration Progress to 30 minutes of continuous aerobic without  signs/symptoms of physical distress Progress to 30 minutes of continuous aerobic without signs/symptoms of physical distress Progress to 50 minutes of aerobic without signs/symptoms of physical distress Progress to 50 minutes of aerobic without signs/symptoms of physical distress Progress to 50 minutes of aerobic without signs/symptoms of physical distress   Intensity Rest + 30 Rest + 30 Rest + 30 Rest + 30 Rest + 30   Progression   Progression Continue progressive overload as per policy without signs/symptoms or physical distress. Continue progressive overload as per policy without signs/symptoms or physical distress. Continue to progress workloads to maintain intensity without signs/symptoms of physical distress. Continue to progress workloads to maintain intensity without signs/symptoms of physical distress. Continue to progress workloads to maintain intensity without signs/symptoms of physical distress.   Resistance Training   Training Prescription Yes Yes Yes Yes Yes   Weight 1 1 1 1 1    Reps 10-15 10-15 10-12 10-12 10-12   Treadmill   MPH 1.1 1.1 1.1 1.2 1.3   Grade 0 0 0 0 0   Minutes 13 13 15 15 15    NuStep   Level 3 3 3 4 5    Watts 20 20 20 20 20    Minutes 15 15 15 15 15    Biostep-RELP   Level 3 5 3 3 3    Watts 13 15 15 15 15    Minutes 15 15 15 15 15      04/27/16 1300           Exercise Review   Progression Yes       Response to Exercise   Blood Pressure (Admit) 110/64 mmHg       Blood Pressure (Exercise) 132/72 mmHg       Blood Pressure (Exit) 108/64 mmHg       Heart Rate (Admit) 76 bpm       Heart Rate (Exercise) 104 bpm       Heart Rate (Exit) 94 bpm       Oxygen Saturation (Admit) 93 %       Oxygen Saturation (Exercise) 91 %       Oxygen Saturation (Exit) 94 %       Rating of Perceived Exertion (Exercise) 13       Perceived Dyspnea (Exercise) 2       Symptoms None       Duration Progress to 50 minutes of aerobic without signs/symptoms of physical distress        Intensity Rest + 30       Progression   Progression Continue to progress workloads to maintain intensity without signs/symptoms of physical distress.       Average METs 2.1       Resistance Training   Training Prescription Yes       Weight 1       Reps 10-12       Interval Training   Interval Training No       Treadmill   MPH 1.5       Grade 1       Minutes 15       NuStep   Level 5       Watts 22       Minutes 15       Biostep-RELP   Level 3  Watts 15       Minutes 15       Home Exercise Plan   Plans to continue exercise at [employer] (comment)  [place]          Exercise Comments:     Exercise Comments      02/09/16 1121 02/23/16 1148 03/31/16 1124 04/12/16 1445 04/27/16 1341   Exercise Comments Reviewed individualized exercise prescription and made increases per departmental policy. Exercise increases were discussed with the patient and they were able to perform the new work loads without issue (no signs or symptoms).  Reviewed individualized exercise prescription and made increases per departmental policy. Exercise increases were discussed with the patient and they were able to perform the new work loads without issue (no signs or symptoms).  Cheryl Cannon stated that she feels stronger and has more energy as she attends the [employer]. She increased her workload on the treadmill today and was "dancing to the music". When I told her "No dancing on the treadmill" She responded with"I feel like dancing, I am so much better since I started this exercise." Cheryl Cannon stated that she has been feeling much better and has been much more active in her daily life. She has already stated her plans to continue exercise after pulmonary rehab. She has an exercise facility where she lives and wants to continue there 3 xper week.  Cheryl Cannon will be graduating soon.  She has been doing great with increasing her workloads.  She will continue to exercise at the [facility]  [employer]     04/30/16 1154           Exercise Comments Cheryl Cannon graduated today!!  She did 36 sessions and was able to use 2 lbs weights on last day.  She will [facility] to exercise at [facility].          Discharge Exercise Prescription (Final Exercise Prescription Changes):     Exercise Prescription Changes - 04/27/16 1300    Exercise Review   Progression Yes   Response to Exercise   Blood Pressure (Admit) 110/64 mmHg   Blood Pressure (Exercise) 132/72 mmHg   Blood Pressure (Exit) 108/64 mmHg   Heart Rate (Admit) 76 bpm   Heart Rate (Exercise) 104 bpm   Heart Rate (Exit) 94 bpm   Oxygen Saturation (Admit) 93 %   Oxygen Saturation (Exercise) 91 %   Oxygen Saturation (Exit) 94 %   Rating of Perceived Exertion (Exercise) 13   Perceived Dyspnea (Exercise) 2   Symptoms None   Duration Progress to 50 minutes of aerobic without signs/symptoms of physical distress   Intensity Rest + 30   Progression   Progression Continue to progress workloads to maintain intensity without signs/symptoms of physical distress.   Average METs 2.1   Resistance Training   Training Prescription Yes   Weight 1   Reps 10-12   Interval Training   Interval Training No   Treadmill   MPH 1.5   Grade 1   Minutes 15   NuStep   Level 5   Watts 22   Minutes 15   Biostep-RELP   Level 3   Watts 15   Minutes 15   Home Exercise Plan   Plans to continue exercise at [employer] (comment)  [place]       Nutrition:  Target Goals: Understanding of nutrition guidelines, daily intake of sodium <1534m, cholesterol <2033m calories 30% from fat and 7% or  less from saturated fats, daily to have 5 or more servings of fruits and vegetables.  Biometrics:    Nutrition Therapy Plan and Nutrition Goals:     Nutrition Therapy & Goals - 01/27/16 0900    Nutrition Therapy   Diet Cheryl Cannon prefers not to meet the dietian; she cooks healthy and eats small portion sizes       Nutrition Discharge: Rate Your Plate Scores:   Psychosocial: Target Goals: Acknowledge presence or absence of depression, maximize coping skills, provide positive support system. Participant is able to verbalize types and ability to use techniques and skills needed for reducing stress and depression.  Initial Review & Psychosocial Screening:     Initial Psych Review & Screening - 01/27/16 0900    Family Dynamics   Good Support System? Yes   Comments Cheryl Madera has good support from her husband and states she has no depression. She is disappointed with the long recovery from her pneumonia, but is looking forward to monitored exercise in [facility] and learning more aboput COPD.   Barriers   Psychosocial barriers to participate in program There are no identifiable barriers or psychosocial needs.;The patient should benefit from training in stress management and relaxation.   Screening Interventions   Interventions Encouraged to exercise      Quality of Life Scores:     Quality of Life - 04/28/16 1000    Quality of Life Scores   Health/Function Pre 29.25 %   Health/Function Post 29.21 %   Health/Function % Change -0.14 %   Socioeconomic Pre 30 %   Socioeconomic Post 28.21 %   Socioeconomic % Change  -5.97 %   Psych/Spiritual Pre 30 %   Psych/Spiritual Post 30 %   Psych/Spiritual % Change 0 %   Family Pre 30 %   Family Post 30 %   Family % Change 0 %   GLOBAL Pre 29.66 %   GLOBAL Post 29.29 %   GLOBAL % Change -1.25 %      PHQ-9:     Recent Review Flowsheet Data    Depression screen [facility] 2/9 04/28/2016 01/27/2016   Decreased Interest 0 0   Down, Depressed, Hopeless 0 0   PHQ - 2 Score 0 0   Altered sleeping 0 0   Tired, decreased energy 0 0   Change in appetite 0 0   Feeling bad or failure about yourself  0 0   Trouble concentrating 0 0   Suicidal thoughts 0 0   PHQ-9 Score 0 0   Difficult doing work/chores - Not difficult at all      Psychosocial Evaluation  and Intervention:     Psychosocial Evaluation - 04/21/16 1121    Discharge Psychosocial Assessment & Intervention   Comments Counselor met with Cheryl. Cannon today prior to her discharging from this program.   She reports feeling much better since coming into this program and is breathing better now.  She also states she made a significant improvement on her 6 minute walk and was excited about that progress as well.  Cheryl. Cannon reports she is sleeping well and feeling stronger overall.  She still has some memory issues, but they have not appeared to worsen while in this program.  Cheryl. Cannon plans to work out with her spouse regularly as a follow up program since [place] gym [place]  Counselor commended Cheryl. Cannon on her hard work and progress made.  Psychosocial Re-Evaluation:     Psychosocial Re-Evaluation      02/18/16 1100 03/24/16 1113 03/31/16 1126       Psychosocial Re-Evaluation   Comments Follow up with Cheryl. Cannon today reporting having a difficult time completing the workouts due to her "body not cooperating."  She continues to have a positive mood and her memory appeared to be better today sharing places she has lived as a traveling [place] wife and how much she enjoys living in the retirement community.  Counselor will continue to follow with Cheryl. Cannon while in this program.  Counselor follow up with Cheryl. Cannon today stating she has finally begun to see some benefits from this program with increased strength, stamina and energy.  She also reports she is breathing better and "standing taller" as a result of consistently working out here.  Cheryl. Raliegh Cannon states her mood and sleep remain good and she plans to work [place] 3x[place]week upon completion of this program.  Counselor commended Cheryl. Cannon for her hard work and commitment to consistent exercise resulting in such positive benefits.   Cheryl Cannon stated that she feels  stronger and has more energy as she attends the [employer]. She increased her workload on the treadmill today and was "dancing to the music". When I told her "No dancing on the treadmill" She responded with"I feel like dancing, I am so much better since I started this exercise.  [facility]       Education: Education Goals: Education classes will be provided on a weekly basis, covering required topics. Participant will state understanding/return demonstration of topics presented.  Learning Barriers/Preferences:     Learning Barriers/Preferences - 01/27/16 0900    Learning Barriers/Preferences   Learning Barriers None   Learning Preferences Group Instruction;Individual Instruction;Pictoral;Skilled Demonstration;Verbal Instruction;Video;Written Material      Education Topics: Initial Evaluation Education: - Verbal, written and demonstration of respiratory meds, RPE/PD scales, oximetry and breathing techniques. Instruction on use of nebulizers and MDIs: cleaning and proper use, rinsing mouth with steroid doses and importance of monitoring MDI activations.          Pulmonary Rehab from 04/26/2016 in [facility] Cardiac and Pulmonary Rehab   Date  01/27/16   Educator  LB   Instruction Review Code  2- meets goals/outcomes      General Nutrition Guidelines/Fats and Fiber: -Group instruction provided by verbal, written material, models and posters to present the general guidelines for heart healthy nutrition. Gives an explanation and review of dietary fats and fiber.      Pulmonary Rehab from 04/26/2016 in [facility] Cardiac and Pulmonary Rehab   Date  04/26/16   Educator  CR   Instruction Review Code  2- meets goals/outcomes      Controlling Sodium/Reading Food Labels: -Group verbal and written material supporting the discussion of sodium use in heart healthy nutrition. Review and explanation with models, verbal and written materials for utilization of the food label.   Exercise  Physiology & Risk Factors: - Group verbal and written instruction with models to review the exercise physiology of the cardiovascular system and associated critical values. Details cardiovascular disease risk factors and the goals associated with each risk factor.   Aerobic Exercise & Resistance Training: - Gives group verbal and written discussion on the health impact of inactivity. On the components of aerobic and resistive training programs and the benefits of this training and how to safely progress through these programs.   Flexibility,  Balance, General Exercise Guidelines: - Provides group verbal and written instruction on the benefits of flexibility and balance training programs. Provides general exercise guidelines with specific guidelines to those with heart or lung disease. Demonstration and skill practice provided.      Pulmonary Rehab from 04/26/2016 in [facility] Cardiac and Pulmonary Rehab   Date  03/31/16   Educator  BS   Instruction Review Code  2- meets goals/outcomes      Stress Management: - Provides group verbal and written instruction about the health risks of elevated stress, cause of high stress, and healthy ways to reduce stress.      Pulmonary Rehab from 04/26/2016 in [facility] Cardiac and Pulmonary Rehab   Date  04/07/16   Educator  [facility]   Instruction Review Code  2- meets goals/outcomes      Depression: - Provides group verbal and written instruction on the correlation between heart/lung disease and depressed mood, treatment options, and the stigmas associated with seeking treatment.      Pulmonary Rehab from 04/26/2016 in [facility] Cardiac and Pulmonary Rehab   Date  02/25/16   Educator  [facility]   Instruction Review Code  2- meets goals/outcomes      Exercise & Equipment Safety: - Individual verbal instruction and demonstration of equipment use and safety with use of the equipment.      Pulmonary Rehab from 04/26/2016 in [facility] Cardiac and Pulmonary Rehab   Date  01/30/16   Educator   RM   Instruction Review Code  2- meets goals/outcomes      Infection Prevention: - Provides verbal and written material to individual with discussion of infection control including proper hand washing and proper equipment cleaning during exercise session.      Pulmonary Rehab from 04/26/2016 in [facility] Cardiac and Pulmonary Rehab   Date  01/30/16   Educator  RM   Instruction Review Code  2- meets goals/outcomes      Falls Prevention: - Provides verbal and written material to individual with discussion of falls prevention and safety.      Pulmonary Rehab from 04/26/2016 in [facility] Cardiac and Pulmonary Rehab   Date  01/27/16   Educator  LB   Instruction Review Code  2- meets goals/outcomes      Diabetes: - Individual verbal and written instruction to review signs/symptoms of diabetes, desired ranges of glucose level fasting, after meals and with exercise. Advice that pre and post exercise glucose checks will be done for 3 sessions at entry of program.   Chronic Lung Diseases: - Group verbal and written instruction to review new updates, new respiratory medications, new advancements in procedures and treatments. Provide informative websites and "800" numbers of self-education.      Pulmonary Rehab from 04/26/2016 in [facility] Cardiac and Pulmonary Rehab   Date  03/29/16   Educator  Carson Myrtle, RT   Instruction Review Code  2- meets goals/outcomes      Lung Procedures: - Group verbal and written instruction to describe testing methods done to diagnose lung disease. Review the outcome of test results. Describe the treatment choices: Pulmonary Function Tests, ABGs and oximetry.   Energy Conservation: - Provide group verbal and written instruction for methods to conserve energy, plan and organize activities. Instruct on pacing techniques, use of adaptive equipment and posture/positioning to relieve shortness of breath.   Triggers: - Group verbal and written instruction to review types of  environmental controls: home humidity, furnaces, filters, dust mite/pet prevention, HEPA vacuums. To discuss weather changes,  air quality and the benefits of nasal washing.      Pulmonary Rehab from 04/26/2016 in [facility] Cardiac and Pulmonary Rehab   Date  03/15/16   Educator  LB   Instruction Review Code  2- meets goals/outcomes      Exacerbations: - Group verbal and written instruction to provide: warning signs, infection symptoms, calling MD promptly, preventive modes, and value of vaccinations. Review: effective airway clearance, coughing and/or vibration techniques. Create an Sports administrator.      Pulmonary Rehab from 04/26/2016 in [facility] Cardiac and Pulmonary Rehab   Date  02/02/16   Educator  LB   Instruction Review Code  2- meets goals/outcomes      Oxygen: - Individual and group verbal and written instruction on oxygen therapy. Includes supplement oxygen, available portable oxygen systems, continuous and intermittent flow rates, oxygen safety, concentrators, and Medicare reimbursement for oxygen.   Respiratory Medications: - Group verbal and written instruction to review medications for lung disease. Drug class, frequency, complications, importance of spacers, rinsing mouth after steroid MDI's, and proper cleaning methods for nebulizers.      Pulmonary Rehab from 04/26/2016 in [facility] Cardiac and Pulmonary Rehab   Date  01/27/16   Educator  LB   Instruction Review Code  2- meets goals/outcomes      AED/CPR: - Group verbal and written instruction with the use of models to demonstrate the basic use of the AED with the basic ABC's of resuscitation.      Pulmonary Rehab from 04/26/2016 in [facility] Cardiac and Pulmonary Rehab   Date  04/09/16   Educator  CE   Instruction Review Code  2- meets goals/outcomes      Breathing Retraining: - Provides individuals verbal and written instruction on purpose, frequency, and proper technique of diaphragmatic breathing and pursed-lipped breathing. Applies  individual practice skills.      Pulmonary Rehab from 04/26/2016 in [facility] Cardiac and Pulmonary Rehab   Date  01/30/16   Educator  [place]   Instruction Review Code  2- meets goals/outcomes      Anatomy and Physiology of the Lungs: - Group verbal and written instruction with the use of models to provide basic lung anatomy and physiology related to function, structure and complications of lung disease.      Pulmonary Rehab from 04/26/2016 in [facility] Cardiac and Pulmonary Rehab   Date  02/13/16   Educator  Erline Levine RRT   Instruction Review Code  2- meets goals/outcomes      Heart Failure: - Group verbal and written instruction on the basics of heart failure: signs/symptoms, treatments, explanation of ejection fraction, enlarged heart and cardiomyopathy.   Sleep Apnea: - Individual verbal and written instruction to review Obstructive Sleep Apnea. Review of risk factors, methods for diagnosing and types of masks and machines for OSA.   Anxiety: - Provides group, verbal and written instruction on the correlation between heart/lung disease and anxiety, treatment options, and management of anxiety.   Relaxation: - Provides group, verbal and written instruction about the benefits of relaxation for patients with heart/lung disease. Also provides patients with examples of relaxation techniques.      Pulmonary Rehab from 04/26/2016 in [facility] Cardiac and Pulmonary Rehab   Date  04/21/16   Educator  Kathreen Cornfield, [facility]   Instruction Review Code  2- Meets goals/outcomes      Knowledge Questionnaire Score:     Knowledge Questionnaire Score - 01/27/16 0900    Knowledge Questionnaire Score   Pre Score 6/10  Core Components/Risk Factors/Patient Goals at Admission:     Personal Goals and Risk Factors at Admission - 01/27/16 0900    Core Components/Risk Factors/Patient Goals on Admission   Sedentary Yes   Intervention Provide advice, education, support and counseling about physical activity/exercise  needs.;Develop an individualized exercise prescription for aerobic and resistive training based on initial evaluation findings, risk stratification, comorbidities and participant's personal goals.  Cheryl Cannon had pneumonia in December and has a long recovery period. She previously exercised at the [facility] and is looking forward to return to her exercise at their facility.   Expected Outcomes Achievement of increased cardiorespiratory fitness and enhanced flexibility, muscular endurance and strength shown through measurements of functional capaciy and personal statement of participant.   Improve shortness of breath with ADL's Yes   Intervention Provide education, individualized exercise plan and daily activity instruction to help decrease symptoms of SOB with activities of daily living.  Cheryl Cannon rates stairs and hills a "3" on shortness of breath questionnaire.   Expected Outcomes Short Term: Achieves a reduction of symptoms when performing activities of daily living.   Develop more efficient breathing techniques such as purse lipped breathing and diaphragmatic breathing; and practicing self-pacing with activity Yes   Intervention Provide education, demonstration and support about specific breathing techniuqes utilized for more efficient breathing. Include techniques such as pursed lipped breathing, diaphragmatic breathing and self-pacing activity.   Expected Outcomes Short Term: Participant will be able to demonstrate and use breathing techniques as needed throughout daily activities.   Increase knowledge of respiratory medications and ability to use respiratory devices properly  Yes   Intervention Provide education and demonstration as needed of appropriate use of medications, inhalers, and oxygen therapy.  Cheryl Cannon takes Spiriva and wears 2l/m [place] at night with sleep. She is being tested this week to see if she still needs the oxygen at night.   Expected Outcomes Short Term:  Achieves understanding of medications use. Understands that oxygen is a medication prescribed by physician. Demonstrates appropriate use of inhaler and oxygen therapy.      Core Components/Risk Factors/Patient Goals Review:      Goals and Risk Factor Review      01/30/16 1243 02/04/16 1509 02/06/16 1343 02/06/16 1348 02/23/16 1000   Core Components/Risk Factors/Patient Goals Review   Personal Goals Review Develop more efficient breathing techniques such as purse lipped breathing and diaphragmatic breathing and practicing self-pacing with activity. Sedentary Improve shortness of breath with ADL's Increase knowledge of respiratory medications and ability to use respiratory devices properly. Improve shortness of breath with ADL's;Develop more efficient breathing techniques such as purse lipped breathing and diaphragmatic breathing and practicing self-pacing with activity.   Review Reviewed PLB with Cheryl Cannon today. Cheryl Cannon has increased her time with her exercise goals on the NS and [employer]. She has become more knowledgeable in setting up the equipment and does want to progress with her exercise goals. Cheryl Rueger does want to continue exercising at the [employer] [facility] with her husband. Cheryl. Morning says that walking causes her to have the most SOB.  Reviewed PLB with her and talked about taking rest periods while walking and shopping. Cheryl. Biegler says that she is comfortable with the respiratory medications she is taking.  She has no questions at this time and does not need a spcer with the medicines that she is currently taking. Cheryl Carrasco states she is feeling so much better - she is breathing better with activites  at home and is walking faster. She is looking forward to getting back to [place] and exercise again with her husband.   Expected Outcomes She will use this technique while exercising to be able to exercise for longer periods of time. Continue improving her strength and  stamina to add one extra day at the [facility]. Increase in distance she can walk and being able to shop longer. Taking her medications as prescribed should help decreased her SOB.      02/27/16 1238 03/30/16 0746 04/09/16 1624 04/09/16 1626 04/09/16 1627   Core Components/Risk Factors/Patient Goals Review   Personal Goals Review Increase knowledge of respiratory medications and ability to use respiratory devices properly. Increase Strength and Stamina;Sedentary;Develop more efficient breathing techniques such as purse lipped breathing and diaphragmatic breathing and practicing self-pacing with activity.;Improve shortness of breath with ADL's;Increase knowledge of respiratory medications and ability to use respiratory devices properly. Increase knowledge of respiratory medications and ability to use respiratory devices properly. Develop more efficient breathing techniques such as purse lipped breathing and diaphragmatic breathing and practicing self-pacing with activity. Improve shortness of breath with ADL's   Review Cheryl. Schlotter takes Spiriva for her breathing at home.  She understands how to use the device and when she is to take her medication. Cheryl Granville attends very regularly and is dedicated to regaining her strength after her exacerbation with hospitalization. Her goalis to return to the [employer] [facility] [employer] and exercise with he husband. She has  gradually increased her exercise goal time to 8mns on eac h piece of equipment. She is doing more things at home and getting out more with less shortness of breath and manages this all with pacing . Spiriva is the only inhaler she uses and she does have a good understanding of the medicine and is compliant. Cheryl. KOatestakes Spiriva and wears 2 lpm O2 at HS.   Reviewed PLB with Cheryl. KQueener  She has a good understanding of the technique Cheryl. KMionesays that she still has good and bad breathing days, but she feels that she can walk farther  distances without stopping since she has been exercising.   Expected Outcomes Taking her medication as prescribed will help decrease her SOB.  Cheryl. KNesselseems to have a good understanding of her respiratory medication and O2.  Using these correctly will reduces her chances of hospitalization and exacerbations. This will reduce her SOB while exercising and on exerction. Hopefully she will continue to build her endurance and find other activities easier to perform.     04/12/16 1444 04/14/16 1038 04/28/16 1000       Core Components/Risk Factors/Patient Goals Review   Personal Goals Review Sedentary Increase Strength and Stamina Sedentary;Increase Strength and Stamina;Improve shortness of breath with ADL's;Develop more efficient breathing techniques such as purse lipped breathing and diaphragmatic breathing and practicing self-pacing with activity.;Increase knowledge of respiratory medications and ability to use respiratory devices properly.     Review see exercise comments ARanataimproved her walk distance 245 ft or 28.6% from her intake assessment.  This exceeds the expected goal of improving 98.4 feet. Cheryl KKelsohas one more session and then will graduate. She has improved her 690m by [id number] Minimal Importance Difference for COPD is 98.4ft82fhe had lost much of her stamina and strength during her illness last year, but has improved and increased her exercise goals with the plan [facility].  She has a good awareness of her MDIs, PLB, pacing, and management of her daily life activites for COPD .      Expected Outcomes  Aamilah will maintain the improved stength and stamina she has gained.         Core Components/Risk Factors/Patient Goals at Discharge (Final Review):      Goals and Risk Factor Review - 04/28/16 1000    Core Components/Risk Factors/Patient Goals Review   Personal Goals Review Sedentary;Increase Strength and  Stamina;Improve shortness of breath with ADL's;Develop more efficient breathing techniques such as purse lipped breathing and diaphragmatic breathing and practicing self-pacing with activity.;Increase knowledge of respiratory medications and ability to use respiratory devices properly.   Review Cheryl Woodle has one more session and then will graduate. She has improved her 42md by 2480f- Minimal Importance Difference for COPD is 98.48f68fShe had lost much of her stamina and strength during her illness last year, but has improved and increased her exercise goals with the plan [facility]  She has a good awareness of her MDIs, PLB, pacing, and management of her daily life activites for COPD .       ITP Comments:     ITP Comments      01/30/16 1044           ITP Comments Personal and exercise goals expected to be met in 34 more sessions. Progress on specific individualized goals will be charted in patient's ITP. Upon completion of the program the patient will be comfortable managing exercise goals and progression on their own.           Comments: Cheryl KarBlaydesaduated [facility] [employer] [facility] and [facility]

## 2016-07-17 ENCOUNTER — Other Ambulatory Visit: Payer: Self-pay

## 2016-07-17 ENCOUNTER — Emergency Department
Admission: EM | Admit: 2016-07-17 | Discharge: 2016-07-17 | Disposition: A | Discharge status: Home / Self Care | Payer: Medicare Other | Source: Home / Self Care | Attending: Emergency Medicine | Admitting: Emergency Medicine

## 2016-07-17 ENCOUNTER — Encounter: Payer: Self-pay | Admitting: Urgent Care

## 2016-07-17 ENCOUNTER — Emergency Department: Payer: Medicare Other

## 2016-07-17 DIAGNOSIS — I110 Hypertensive heart disease with heart failure: Secondary | ICD-10-CM

## 2016-07-17 DIAGNOSIS — R0600 Dyspnea, unspecified: Secondary | ICD-10-CM | POA: Insufficient documentation

## 2016-07-17 DIAGNOSIS — R079 Chest pain, unspecified: Secondary | ICD-10-CM | POA: Insufficient documentation

## 2016-07-17 DIAGNOSIS — J449 Chronic obstructive pulmonary disease, unspecified: Secondary | ICD-10-CM

## 2016-07-17 DIAGNOSIS — Z85118 Personal history of other malignant neoplasm of bronchus and lung: Secondary | ICD-10-CM

## 2016-07-17 DIAGNOSIS — I5033 Acute on chronic diastolic (congestive) heart failure: Secondary | ICD-10-CM | POA: Insufficient documentation

## 2016-07-17 DIAGNOSIS — Z79899 Other long term (current) drug therapy: Secondary | ICD-10-CM | POA: Insufficient documentation

## 2016-07-17 DIAGNOSIS — R140 Abdominal distension (gaseous): Secondary | ICD-10-CM | POA: Diagnosis not present

## 2016-07-17 DIAGNOSIS — Z87891 Personal history of nicotine dependence: Secondary | ICD-10-CM | POA: Insufficient documentation

## 2016-07-17 DIAGNOSIS — Z950 Presence of cardiac pacemaker: Secondary | ICD-10-CM

## 2016-07-17 DIAGNOSIS — J441 Chronic obstructive pulmonary disease with (acute) exacerbation: Secondary | ICD-10-CM | POA: Diagnosis not present

## 2016-07-17 LAB — BASIC METABOLIC PANEL
ANION GAP: 9 (ref 5–15)
BUN: 22 mg/dL — ABNORMAL HIGH (ref 6–20)
CHLORIDE: 98 mmol/L — AB (ref 101–111)
CO2: 31 mmol/L (ref 22–32)
Calcium: 9.2 mg/dL (ref 8.9–10.3)
Creatinine, Ser: 1.28 mg/dL — ABNORMAL HIGH (ref 0.44–1.00)
GFR calc non Af Amer: 39 mL/min — ABNORMAL LOW (ref >60)
GFR, EST AFRICAN AMERICAN: 46 mL/min — AB (ref >60)
Glucose, Bld: 146 mg/dL — ABNORMAL HIGH (ref 65–99)
POTASSIUM: 4 mmol/L (ref 3.5–5.1)
Sodium: 138 mmol/L (ref 135–145)

## 2016-07-17 LAB — CBC
HEMATOCRIT: 38.8 % (ref 35.0–47.0)
HEMOGLOBIN: 13.4 g/dL (ref 12.0–16.0)
MCH: 37.1 pg — AB (ref 26.0–34.0)
MCHC: 34.5 g/dL (ref 32.0–36.0)
MCV: 107.4 fL — AB (ref 80.0–100.0)
Platelets: 121 10*3/uL — ABNORMAL LOW (ref 150–440)
RBC: 3.62 MIL/uL — AB (ref 3.80–5.20)
RDW: 15.5 % — ABNORMAL HIGH (ref 11.5–14.5)
WBC: 4.8 10*3/uL (ref 3.6–11.0)

## 2016-07-17 LAB — TROPONIN I: Troponin I: <0.03 ng/mL (ref <0.03)

## 2016-07-17 MED ORDER — SODIUM CHLORIDE 0.9 % IV BOLUS (SEPSIS)
500.0000 mL | Freq: Once | INTRAVENOUS | Status: AC
  Administered 2016-07-17: 500 mL via INTRAVENOUS

## 2016-07-17 MED ORDER — ALBUTEROL SULFATE (2.5 MG/3ML) 0.083% IN NEBU
2.5000 mg | INHALATION_SOLUTION | Freq: Once | RESPIRATORY_TRACT | Status: AC
  Administered 2016-07-17: 2.5 mg via RESPIRATORY_TRACT
  Filled 2016-07-17: qty 3

## 2016-07-17 MED ORDER — IOPAMIDOL (ISOVUE-370) INJECTION 76%
60.0000 mL | Freq: Once | INTRAVENOUS | Status: AC | PRN
  Administered 2016-07-17: 60 mL via INTRAVENOUS

## 2016-07-17 MED ORDER — ALBUTEROL SULFATE HFA 108 (90 BASE) MCG/ACT IN AERS: 2.0000 | INHALATION_SPRAY | Freq: Four times a day (QID) | RESPIRATORY_TRACT | 0 refills | Status: AC | PRN

## 2016-07-17 NOTE — ED Provider Notes (Signed)
[facility] Emergency Department Provider Note   ____________________________________________   First MD Initiated Contact with Patient 07/17/16 0700     (approximate)  I have reviewed the triage vital signs and the nursing notes.   HISTORY  Chief Complaint Shortness of Breath    HPI BRONNIE Cannon is a 80 y.o. female with a history of atrial fibrillation as well as pulmonary embolism and COPD on eliquis who is presenting with sudden onset shortness of breath at worker at 3:30 AM. She says that she woke from sleep gasping for air and continued to gasp for air for about 2-1/2 hours. She said she also had a chest pain that worsened with breathing and was in the middle of her chest which is also dissipated at this point. She did not take any breathing treatments or any other medications to aid with the symptoms. She denies any cough or fever. Says that she does not like coming to the hospital and that she does not know why she had her shortness of breath. She has been otherwise well lately. Her oxygen did not fall off. She is on a baseline 2 L nasal cannula oxygen at home. She says she has not missed any doses of her eliquis.   Past Medical History:  Diagnosis Date  . A-fib ([place])   . Cancer ([place])   . Celiac disease   . COPD (chronic obstructive pulmonary disease) ([place])   . Lung cancer ([place])   . Memory deficit     Patient Active Problem List   Diagnosis Date Noted  . Acute on chronic diastolic heart failure ([place]) 01/06/2016  . Pulmonary embolism ([place]) 11/20/2015  . Pneumonia 11/15/2015  . CAP (community acquired pneumonia) 11/12/2015  . Mild chronic obstructive pulmonary disease ([place]) 10/14/2015  . B12 deficiency 10/14/2015  . Pulmonary hypertension ([place]) 09/25/2015  . Benign essential HTN 09/04/2015  . MI (mitral incompetence) 09/04/2015  . TI (tricuspid incompetence) 09/04/2015  . Can't get food down 08/13/2015  . Insomnia, persistent 07/14/2015  .  Mild dementia 07/14/2015  . H/O gastrointestinal disease 06/09/2015  . Hereditary and idiopathic peripheral neuropathy 05/12/2015  . D (diarrhea) 11/25/2014  . Chronic obstructive pulmonary disease ([place]) 10/20/2014  . Sensory neuropathy ([place]) 10/20/2014  . Neuropathy ([place]) 08/16/2014  . Dementia 08/16/2014  . Lung cancer ([place]) 08/16/2014  . Celiac disease 08/16/2014  . Atrial fibrillation ([place]) 08/16/2014  . Breath shortness 06/13/2012  . Encounter for adjustment and management of other part of cardiac pacemaker 05/10/2012  . Non-small cell carcinoma of lung ([place]) 09/08/2011  . Artificial cardiac pacemaker 09/08/2011  . Malignant pleural effusion 08/20/2010  . Cancer of lung ([place]) 04/24/2010    Past Surgical History:  Procedure Laterality Date  . LUNG CANCER SURGERY    . LUNG LOBECTOMY Right   . PACEMAKER INSERTION    . PACEMAKER PLACEMENT      Prior to Admission medications   Medication Sig Start Date End Date Taking? Authorizing Provider  ALPRAZolam Duanne Moron) 0.25 MG tablet Take 0.25 mg by mouth daily as needed for anxiety.     Historical Provider, MD  apixaban (ELIQUIS) 5 MG TABS tablet Take 1 tablet (5 mg total) by mouth 2 (two) times daily. 11/20/15   Aldean Jewett, MD  cholecalciferol (VITAMIN D) 1000 UNITS tablet Take 2,000 Units by mouth daily.     Historical Provider, MD  cyanocobalamin (,VITAMIN B-12,) 1000 MCG/ML injection Inject 1,000 mcg into the muscle every 30 (thirty) days. Injection  once a month    Historical Provider, MD  docusate sodium (COLACE) 100 MG capsule Take 100 mg by mouth 2 (two) times daily.    Historical Provider, MD  donepezil (ARICEPT) 10 MG tablet Take 1 tablet (10 mg total) by mouth at bedtime. 02/10/16   Cameron Sprang, MD  flecainide (TAMBOCOR) 50 MG tablet Take 1.5 mg by mouth 2 (two) times daily.    Historical Provider, MD  furosemide (LASIX) 20 MG tablet Take 1 tablet (20 mg total) by mouth daily. 11/17/15   Aldean Jewett, MD    gabapentin (NEURONTIN) 300 MG capsule TAKE 4 CAPSULES IN THE MORNING AND 4 CAPSULES IN THE EVENING 04/05/16   Cameron Sprang, MD  gabapentin (NEURONTIN) 300 MG capsule Take 1,200 mg by mouth 2 (two) times daily.    Historical Provider, MD  levalbuterol [facility] HFA) 45 MCG/ACT inhaler Inhale 2 puffs into the lungs every 4 (four) hours as needed for wheezing.    Historical Provider, MD  metoprolol tartrate (LOPRESSOR) 25 MG tablet Take 12.5 mg by mouth.    Historical Provider, MD  pantoprazole (PROTONIX) 40 MG tablet Take 40 mg by mouth daily.     Historical Provider, MD  tiotropium (SPIRIVA) 18 MCG inhalation capsule Place 18 mcg into inhaler and inhale daily.     Historical Provider, MD    Allergies Gluten meal; Gluten meal; and Parabens  Family History  Problem Relation Age of Onset  . Cancer Mother   . Cancer Sister     Social History Social History  Substance Use Topics  . Smoking status: Former Smoker    Packs/day: 1.00    Years: 44.00    Quit date: 11/22/1998  . Smokeless tobacco: Never Used  . Alcohol use Yes     Comment: social    Review of Systems Constitutional: No fever/chills Eyes: No visual changes. ENT: No sore throat. Cardiovascular: As above Respiratory: As above. Denies any cough or fever. Gastrointestinal: [name] that while she was undergoing this episode that she was experiencing some abdominal "bloating" which she says has since resolved.  No nausea, no vomiting.  No diarrhea.  No constipation. Genitourinary: Negative for dysuria. Musculoskeletal: Negative for back pain. Skin: Negative for rash. Neurological: Negative for headaches, focal weakness or numbness.  10-point ROS otherwise negative.  ____________________________________________   PHYSICAL EXAM:  VITAL SIGNS: ED Triage Vitals  Enc Vitals Group     BP 07/17/16 0422 114/60     Pulse Rate 07/17/16 0422 68     Resp 07/17/16 0422 20     Temp 07/17/16 0422 97.8 F (36.6 C)     Temp Source  07/17/16 0422 Oral     SpO2 07/17/16 0422 98 %     Weight 07/17/16 0423 108 lb (49 kg)     Height 07/17/16 0423 '5\' 2"'$  (1.575 m)     Head Circumference --      Peak Flow --      Pain Score 07/17/16 0424 4     Pain Loc --      Pain Edu? --      Excl. in [place]? --     Constitutional: Alert and oriented. Well appearing and in no acute distress. Eyes: Conjunctivae are normal. PERRL. EOMI. Head: Atraumatic. Nose: No congestion/rhinnorhea. Mouth/Throat: Mucous membranes are moist.   Neck: No stridor.   Cardiovascular: Normal rate, irregular rhythm. Grossly normal heart sounds.   Respiratory: Normal respiratory effort.  No retractions. Right lower and middle field  with rales. Patient has history of surgery to the right hemithorax. Gastrointestinal: Soft and nontender. No distention.  Musculoskeletal: No lower extremity tenderness nor edema.  No joint effusions. Neurologic:  Normal speech and language. No gross focal neurologic deficits are appreciated.  Skin:  Skin is warm, dry and intact. No rash noted. Psychiatric: Mood and affect are normal. Speech and behavior are normal.  ____________________________________________   LABS (all labs ordered are listed, but only abnormal results are displayed)  Labs Reviewed  BASIC METABOLIC PANEL - Abnormal; Notable for the following:       Result Value   Chloride 98 (*)    Glucose, Bld 146 (*)    BUN 22 (*)    Creatinine, Ser 1.28 (*)    GFR calc non Af Amer 39 (*)    GFR calc Af Amer 46 (*)    All other components within normal limits  CBC - Abnormal; Notable for the following:    RBC 3.62 (*)    MCV 107.4 (*)    MCH 37.1 (*)    RDW 15.5 (*)    Platelets 121 (*)    All other components within normal limits  TROPONIN I  TROPONIN I   ____________________________________________  EKG  ED ECG REPORT I, Doran Stabler, the attending physician, personally viewed and interpreted this ECG.   Date: 07/17/2016  EKG Time: 431  Rate:  85  Rhythm: atrial fibrillation, rate 85  Axis: Reds per axis deviation  Intervals:nonspecific intraventricular conduction delay  ST&T Change: No ST segment elevation or depression. No abnormal T-wave inversions. No significant change from 11/15/2015  ____________________________________________  RADIOLOGY  DG Chest 2 View (Accession [id number]) (Order [id number])  Imaging  Date: 07/17/2016 Department: [facility] EMERGENCY DEPARTMENT Released By: Karen Kitchens, RN (auto-released) Authorizing: Orbie Pyo, MD  PACS Images   Show images for DG Chest 2 View  Study Result   CLINICAL DATA:  Shortness of breath.  Abdominal bloating.  EXAM: CHEST  2 VIEW  COMPARISON:  Radiographs 11/17/2015, chest CT 11/12/2015  FINDINGS: Volume loss in the right lung with postsurgical change and blunting of the costophrenic angle. Improved right lung base aeration Dual lead left-sided pacemaker remains in place. Unchanged cardiomediastinal contours. Improved pulmonary edema. The left lung is clear. Calcified granuloma in the left lung. There is atherosclerosis of thoracic aorta. Scoliosis of the thoracic spine with diffuse bony under mineralization.  IMPRESSION: Postsurgical change in the right hemithorax. Improved right lung base aeration from prior. No acute abnormality is seen.   Electronically Signed   By: Jeb Levering M.D.   On: 07/17/2016 05:03     CT Angio Chest PE W and/or Wo Contrast (Accession [id number]) (Order [id number])  Imaging  Date: 07/17/2016 Department: [facility] EMERGENCY DEPARTMENT Released By/Authorizing: Orbie Pyo, MD (auto-released)  PACS Images   Show images for CT Angio Chest PE W and/or Wo Contrast  Study Result   CLINICAL DATA:  Shortness of breath beginning earlier tonight. Chest pain.  EXAM: CT ANGIOGRAPHY CHEST WITH CONTRAST  TECHNIQUE: Multidetector CT imaging of the  chest was performed using the standard protocol during bolus administration of intravenous contrast. Multiplanar CT image reconstructions and MIPs were obtained to evaluate the vascular anatomy.  CONTRAST:  60 cc Isovue 370 IV  COMPARISON:  Chest x-ray earlier today.  Chest CT 11/12/2015  FINDINGS: Cardiovascular: Aortic and coronary artery calcifications. No evidence of aortic aneurysm. Heart is borderline in size. Small pericardial  effusion. No filling defects in the pulmonary arteries to suggest pulmonary emboli.  Mediastinum/Nodes: Shift of the mediastinum to the right related to postsurgical changes. No adenopathy.  Lungs/Pleura: Emphysematous changes within the lungs. Postoperative changes on the right. Areas of scarring in the right apex and right base. Calcified granuloma at in the posterior left lower lobe. No confluent opacities on the left. No effusions.  Upper Abdomen: Imaging into the upper abdomen shows no acute findings.  Musculoskeletal: No acute bony abnormality or focal bone lesion. Degenerative changes in the thoracic spine.  Review of the MIP images confirms the above findings.  IMPRESSION: Postoperative changes on the right. Scarring in the right apex and right base.  No evidence of pulmonary embolus.  Small pericardial effusion.  Coronary artery disease, aortic atherosclerosis.   Electronically Signed   By: Rolm Baptise M.D.   On: 07/17/2016 09:50    ____________________________________________   PROCEDURES  Procedure(s) performed:   Procedures  Critical Care performed:   ____________________________________________   INITIAL IMPRESSION / ASSESSMENT AND PLAN / ED COURSE  Pertinent labs & imaging results that were available during my care of the patient were reviewed by me and considered in my medical decision making (see chart for details).  Unclear cause of the patient's shortness of breath and chest pain. She says  that while she is talking and moving around in the bed she feels slightly short of breath but denies any chest pain at this time. Reassuring cardiac workup so far. I will repeat a troponin as well as order a CAT scan of the chest to look for recurrent pulmonary embolus. I feel the patient is too high risk for d-dimer rule out.  Clinical Course   ----------------------------------------- 10:02 AM on 07/17/2016 -----------------------------------------  Patient feels completely back to baseline at this point without any chest pain or respiratory distress. Very reassuring workup including a CAT scan which did not show pulmonary was. Small pericardial effusion noted. The patient was following up with her cardiologist, Dr. Nehemiah Massed. Unclear of exact cause of the patient's symptoms. Possible COPD. Very reassuring workup however. I feel she is appropriate for discharge at this time.  ____________________________________________   FINAL CLINICAL IMPRESSION(S) / ED DIAGNOSES  Dyspnea. Chest pain.    NEW MEDICATIONS STARTED DURING THIS VISIT:  New Prescriptions   No medications on file     Note:  This document was prepared using Dragon voice recognition software and may include unintentional dictation errors.    Orbie Pyo, MD 07/17/16 1004

## 2016-07-17 NOTE — ED Triage Notes (Addendum)
Patient presents with c/o increased SOB that began earlier tonight. Of note, patient on supplemental oxygen (2L/[place]) at night; presents to the ED with her supplemental oxygen in place. Patient denies chest pain. Patient reports some abdominal "bloating" as well.

## 2016-07-17 NOTE — ED Notes (Signed)
Pt c/o SOB beginning yesterday. Pt has hx of COPD and lung cancer. Pt currently in remission. Pt reports she normally wears O2 2L [place] at night, but reporting more SOB than normal

## 2016-07-20 ENCOUNTER — Emergency Department: Payer: Medicare Other

## 2016-07-20 ENCOUNTER — Encounter: Payer: Self-pay | Admitting: Emergency Medicine

## 2016-07-20 ENCOUNTER — Inpatient Hospital Stay
Admission: EM | Admit: 2016-07-20 | Discharge: 2016-07-21 | DRG: 191 | Disposition: A | Discharge status: Home / Self Care | Payer: Medicare Other | Attending: Specialist | Admitting: Specialist

## 2016-07-20 DIAGNOSIS — N390 Urinary tract infection, site not specified: Secondary | ICD-10-CM | POA: Diagnosis present

## 2016-07-20 DIAGNOSIS — K219 Gastro-esophageal reflux disease without esophagitis: Secondary | ICD-10-CM | POA: Diagnosis present

## 2016-07-20 DIAGNOSIS — I482 Chronic atrial fibrillation: Secondary | ICD-10-CM | POA: Diagnosis present

## 2016-07-20 DIAGNOSIS — Z950 Presence of cardiac pacemaker: Secondary | ICD-10-CM | POA: Diagnosis not present

## 2016-07-20 DIAGNOSIS — K900 Celiac disease: Secondary | ICD-10-CM | POA: Diagnosis present

## 2016-07-20 DIAGNOSIS — Z902 Acquired absence of lung [part of]: Secondary | ICD-10-CM

## 2016-07-20 DIAGNOSIS — Z9981 Dependence on supplemental oxygen: Secondary | ICD-10-CM | POA: Diagnosis not present

## 2016-07-20 DIAGNOSIS — I959 Hypotension, unspecified: Secondary | ICD-10-CM | POA: Diagnosis present

## 2016-07-20 DIAGNOSIS — E872 Acidosis: Secondary | ICD-10-CM | POA: Diagnosis present

## 2016-07-20 DIAGNOSIS — J441 Chronic obstructive pulmonary disease with (acute) exacerbation: Principal | ICD-10-CM | POA: Diagnosis present

## 2016-07-20 DIAGNOSIS — R911 Solitary pulmonary nodule: Secondary | ICD-10-CM | POA: Diagnosis present

## 2016-07-20 DIAGNOSIS — F419 Anxiety disorder, unspecified: Secondary | ICD-10-CM | POA: Diagnosis present

## 2016-07-20 DIAGNOSIS — F0390 Unspecified dementia without behavioral disturbance: Secondary | ICD-10-CM | POA: Diagnosis present

## 2016-07-20 DIAGNOSIS — Z7901 Long term (current) use of anticoagulants: Secondary | ICD-10-CM

## 2016-07-20 DIAGNOSIS — R140 Abdominal distension (gaseous): Secondary | ICD-10-CM | POA: Diagnosis present

## 2016-07-20 DIAGNOSIS — Z85118 Personal history of other malignant neoplasm of bronchus and lung: Secondary | ICD-10-CM | POA: Diagnosis not present

## 2016-07-20 DIAGNOSIS — Z79899 Other long term (current) drug therapy: Secondary | ICD-10-CM | POA: Diagnosis not present

## 2016-07-20 DIAGNOSIS — Z87891 Personal history of nicotine dependence: Secondary | ICD-10-CM

## 2016-07-20 DIAGNOSIS — K5900 Constipation, unspecified: Secondary | ICD-10-CM | POA: Diagnosis present

## 2016-07-20 DIAGNOSIS — R0609 Other forms of dyspnea: Secondary | ICD-10-CM

## 2016-07-20 LAB — URINALYSIS COMPLETE WITH MICROSCOPIC (ARMC ONLY)
Bilirubin Urine: NEGATIVE
GLUCOSE, UA: NEGATIVE mg/dL
Ketones, ur: NEGATIVE mg/dL
Nitrite: NEGATIVE
PROTEIN: 100 mg/dL — AB
SPECIFIC GRAVITY, URINE: 1.032 — AB (ref 1.005–1.030)
TRANS EPITHEL UA: 5
pH: 5 (ref 5.0–8.0)

## 2016-07-20 LAB — BLOOD GAS, ARTERIAL
ALLENS TEST (PASS/FAIL): POSITIVE — AB
Acid-base deficit: 0.3 mmol/L (ref 0.0–2.0)
Bicarbonate: 22.6 mmol/L (ref 20.0–28.0)
FIO2: 0.28
O2 SAT: 96.6 %
PATIENT TEMPERATURE: 37
PCO2 ART: 31 mmHg — AB (ref 32.0–48.0)
pH, Arterial: 7.47 — ABNORMAL HIGH (ref 7.350–7.450)
pO2, Arterial: 81 mmHg — ABNORMAL LOW (ref 83.0–108.0)

## 2016-07-20 LAB — CBC
HCT: 40.9 % (ref 35.0–47.0)
Hemoglobin: 14.1 g/dL (ref 12.0–16.0)
MCH: 37 pg — AB (ref 26.0–34.0)
MCHC: 34.4 g/dL (ref 32.0–36.0)
MCV: 107.5 fL — AB (ref 80.0–100.0)
PLATELETS: 120 10*3/uL — AB (ref 150–440)
RBC: 3.81 MIL/uL (ref 3.80–5.20)
RDW: 15.4 % — AB (ref 11.5–14.5)
WBC: 7.3 10*3/uL (ref 3.6–11.0)

## 2016-07-20 LAB — TROPONIN I

## 2016-07-20 LAB — COMPREHENSIVE METABOLIC PANEL
ALT: 19 U/L (ref 14–54)
ANION GAP: 12 (ref 5–15)
AST: 40 U/L (ref 15–41)
Albumin: 4.8 g/dL (ref 3.5–5.0)
Alkaline Phosphatase: 123 U/L (ref 38–126)
BUN: 21 mg/dL — ABNORMAL HIGH (ref 6–20)
CHLORIDE: 97 mmol/L — AB (ref 101–111)
CO2: 26 mmol/L (ref 22–32)
Calcium: 9.6 mg/dL (ref 8.9–10.3)
Creatinine, Ser: 1.37 mg/dL — ABNORMAL HIGH (ref 0.44–1.00)
GFR, EST AFRICAN AMERICAN: 42 mL/min — AB (ref >60)
GFR, EST NON AFRICAN AMERICAN: 36 mL/min — AB (ref >60)
Glucose, Bld: 122 mg/dL — ABNORMAL HIGH (ref 65–99)
Potassium: 4.1 mmol/L (ref 3.5–5.1)
SODIUM: 135 mmol/L (ref 135–145)
Total Bilirubin: 2 mg/dL — ABNORMAL HIGH (ref 0.3–1.2)
Total Protein: 7.7 g/dL (ref 6.5–8.1)

## 2016-07-20 LAB — LACTIC ACID, PLASMA
LACTIC ACID, VENOUS: 2.4 mmol/L — AB (ref 0.5–1.9)
LACTIC ACID, VENOUS: 3.4 mmol/L — AB (ref 0.5–1.9)

## 2016-07-20 LAB — LIPASE, BLOOD: LIPASE: 35 U/L (ref 11–51)

## 2016-07-20 MED ORDER — DIATRIZOATE MEGLUMINE & SODIUM 66-10 % PO SOLN
15.0000 mL | ORAL | Status: AC
  Administered 2016-07-20: 30 mL via ORAL

## 2016-07-20 MED ORDER — APIXABAN 5 MG PO TABS
5.0000 mg | ORAL_TABLET | Freq: Two times a day (BID) | ORAL | Status: DC
  Administered 2016-07-20 – 2016-07-21 (×2): 5 mg via ORAL
  Filled 2016-07-20 (×2): qty 1

## 2016-07-20 MED ORDER — DOCUSATE SODIUM 100 MG PO CAPS
100.0000 mg | ORAL_CAPSULE | Freq: Two times a day (BID) | ORAL | Status: DC
  Administered 2016-07-21: 09:00:00 100 mg via ORAL
  Filled 2016-07-20: qty 1

## 2016-07-20 MED ORDER — DONEPEZIL HCL 5 MG PO TABS
10.0000 mg | ORAL_TABLET | Freq: Every day | ORAL | Status: DC
Start: 2016-07-20 — End: 2016-07-21
  Administered 2016-07-20: 23:00:00 10 mg via ORAL
  Filled 2016-07-20: qty 2

## 2016-07-20 MED ORDER — ONDANSETRON HCL 4 MG/2ML IJ SOLN: 4.0000 mg | Freq: Four times a day (QID) | INTRAMUSCULAR | Status: DC | PRN

## 2016-07-20 MED ORDER — ONDANSETRON HCL 4 MG PO TABS: 4.0000 mg | ORAL_TABLET | Freq: Four times a day (QID) | ORAL | Status: DC | PRN

## 2016-07-20 MED ORDER — BISACODYL 5 MG PO TBEC: 5.0000 mg | DELAYED_RELEASE_TABLET | Freq: Every day | ORAL | Status: DC | PRN

## 2016-07-20 MED ORDER — METHYLPREDNISOLONE SODIUM SUCC 125 MG IJ SOLR
60.0000 mg | Freq: Once | INTRAMUSCULAR | Status: AC
  Administered 2016-07-20: 23:00:00 60 mg via INTRAVENOUS
  Filled 2016-07-20: qty 2

## 2016-07-20 MED ORDER — DEXTROSE 5 % IV SOLN: 1.0000 g | INTRAVENOUS | Status: DC

## 2016-07-20 MED ORDER — DEXTROSE 5 % IV SOLN
1.0000 g | Freq: Once | INTRAVENOUS | Status: AC
  Administered 2016-07-20: 1 g via INTRAVENOUS
  Filled 2016-07-20: qty 10

## 2016-07-20 MED ORDER — SODIUM CHLORIDE 0.9 % IV BOLUS (SEPSIS)
500.0000 mL | Freq: Once | INTRAVENOUS | Status: AC
  Administered 2016-07-20: 500 mL via INTRAVENOUS

## 2016-07-20 MED ORDER — SODIUM CHLORIDE 0.9 % IV SOLN
INTRAVENOUS | Status: DC
  Administered 2016-07-21 (×2): via INTRAVENOUS

## 2016-07-20 MED ORDER — SODIUM CHLORIDE 0.9 % IV BOLUS (SEPSIS): 1000.0000 mL | Freq: Once | INTRAVENOUS | Status: DC

## 2016-07-20 MED ORDER — ONDANSETRON HCL 4 MG/2ML IJ SOLN
4.0000 mg | Freq: Once | INTRAMUSCULAR | Status: AC
  Administered 2016-07-20: 4 mg via INTRAVENOUS

## 2016-07-20 MED ORDER — METOPROLOL TARTRATE 25 MG PO TABS
12.5000 mg | ORAL_TABLET | Freq: Two times a day (BID) | ORAL | Status: DC
  Administered 2016-07-20 – 2016-07-21 (×2): 12.5 mg via ORAL
  Filled 2016-07-20 (×2): qty 1

## 2016-07-20 MED ORDER — SODIUM CHLORIDE 0.9 % IV BOLUS (SEPSIS): 500.0000 mL | Freq: Once | INTRAVENOUS | Status: AC

## 2016-07-20 MED ORDER — ONDANSETRON HCL 4 MG/2ML IJ SOLN
INTRAMUSCULAR | Status: AC
  Administered 2016-07-20: 4 mg via INTRAVENOUS
  Filled 2016-07-20: qty 2

## 2016-07-20 MED ORDER — PANTOPRAZOLE SODIUM 40 MG PO TBEC
40.0000 mg | DELAYED_RELEASE_TABLET | Freq: Every day | ORAL | Status: DC
  Administered 2016-07-21: 40 mg via ORAL
  Filled 2016-07-20: qty 1

## 2016-07-20 MED ORDER — TIOTROPIUM BROMIDE MONOHYDRATE 18 MCG IN CAPS
18.0000 ug | ORAL_CAPSULE | Freq: Every day | RESPIRATORY_TRACT | Status: DC
  Administered 2016-07-21: 18 ug via RESPIRATORY_TRACT
  Filled 2016-07-20: qty 5

## 2016-07-20 MED ORDER — PREDNISONE 20 MG PO TABS
40.0000 mg | ORAL_TABLET | Freq: Every day | ORAL | Status: DC
Start: 2016-07-21 — End: 2016-07-21
  Administered 2016-07-21: 40 mg via ORAL
  Filled 2016-07-20: qty 2

## 2016-07-20 MED ORDER — ACETAMINOPHEN 650 MG RE SUPP: 650.0000 mg | Freq: Four times a day (QID) | RECTAL | Status: DC | PRN

## 2016-07-20 MED ORDER — FLECAINIDE ACETATE 50 MG PO TABS
75.0000 mg | ORAL_TABLET | Freq: Two times a day (BID) | ORAL | Status: DC
  Administered 2016-07-20 – 2016-07-21 (×2): 75 mg via ORAL
  Filled 2016-07-20 (×3): qty 2

## 2016-07-20 MED ORDER — SODIUM CHLORIDE 0.9 % IV BOLUS (SEPSIS)
1000.0000 mL | Freq: Once | INTRAVENOUS | Status: AC
Start: 2016-07-20 — End: 2016-07-20
  Administered 2016-07-20: 1000 mL via INTRAVENOUS

## 2016-07-20 MED ORDER — ALPRAZOLAM 0.5 MG PO TABS: 0.2500 mg | ORAL_TABLET | Freq: Every day | ORAL | Status: DC | PRN

## 2016-07-20 MED ORDER — IOPAMIDOL (ISOVUE-300) INJECTION 61%
75.0000 mL | Freq: Once | INTRAVENOUS | Status: AC | PRN
  Administered 2016-07-20: 75 mL via INTRAVENOUS

## 2016-07-20 MED ORDER — IPRATROPIUM-ALBUTEROL 0.5-2.5 (3) MG/3ML IN SOLN: 3.0000 mL | RESPIRATORY_TRACT | Status: DC | PRN

## 2016-07-20 MED ORDER — ACETAMINOPHEN 325 MG PO TABS: 650.0000 mg | ORAL_TABLET | Freq: Four times a day (QID) | ORAL | Status: DC | PRN

## 2016-07-20 MED ORDER — POLYETHYLENE GLYCOL 3350 17 G PO PACK: 17.0000 g | PACK | Freq: Every day | ORAL | Status: DC | PRN

## 2016-07-20 MED ORDER — GABAPENTIN 400 MG PO CAPS
1200.0000 mg | ORAL_CAPSULE | Freq: Two times a day (BID) | ORAL | Status: DC
  Administered 2016-07-20 – 2016-07-21 (×2): 1200 mg via ORAL
  Filled 2016-07-20 (×3): qty 3

## 2016-07-20 NOTE — ED Provider Notes (Addendum)
[facility] Emergency Department Provider Note  ____________________________________________   I have reviewed the triage vital signs and the nursing notes.   HISTORY  Chief Complaint Abdominal Pain and Hypotension    HPI Cheryl Cannon is a 80 y.o. female  history of atrial fibrillation on a pacemaker, history of lung cancer no longer chemoradiation 5 years history of COPD, history of celiac disease presents today complaining of 2 different complaints for the first is short of breath. Patient has had shortness of breath on and off last several days which she has been getting worse. She's had no fever or chills. She had a CT scan of her chest done at this facility a few days ago which showed no pulmonary embolism or pneumonia. She states her shortness of breath has been coming and going but now her possible concern is abdominal bloating sensation. She does not have significant pain but she has abdominal bloating and she has had some loose stools. Denies melena denies bright red blood per rectum. Denies hematemesis went to see her primary care doctor and she was sent here because it seemed as if she had some perioral cyanosis. Patient is only on oxygen at night. Here, 2 L her sats are good.       Past Medical History:  Diagnosis Date  . A-fib ([place])   . Cancer ([place])   . Celiac disease   . COPD (chronic obstructive pulmonary disease) ([place])   . Lung cancer ([place])   . Memory deficit     Patient Active Problem List   Diagnosis Date Noted  . Acute on chronic diastolic heart failure ([place]) 01/06/2016  . Pulmonary embolism ([place]) 11/20/2015  . Pneumonia 11/15/2015  . CAP (community acquired pneumonia) 11/12/2015  . Mild chronic obstructive pulmonary disease ([place]) 10/14/2015  . B12 deficiency 10/14/2015  . Pulmonary hypertension ([place]) 09/25/2015  . Benign essential HTN 09/04/2015  . MI (mitral incompetence) 09/04/2015  . TI (tricuspid incompetence) 09/04/2015  .  Can't get food down 08/13/2015  . Insomnia, persistent 07/14/2015  . Mild dementia 07/14/2015  . H/O gastrointestinal disease 06/09/2015  . Hereditary and idiopathic peripheral neuropathy 05/12/2015  . D (diarrhea) 11/25/2014  . Chronic obstructive pulmonary disease ([place]) 10/20/2014  . Sensory neuropathy ([place]) 10/20/2014  . Neuropathy ([place]) 08/16/2014  . Dementia 08/16/2014  . Lung cancer ([place]) 08/16/2014  . Celiac disease 08/16/2014  . Atrial fibrillation ([place]) 08/16/2014  . Breath shortness 06/13/2012  . Encounter for adjustment and management of other part of cardiac pacemaker 05/10/2012  . Non-small cell carcinoma of lung ([place]) 09/08/2011  . Artificial cardiac pacemaker 09/08/2011  . Malignant pleural effusion 08/20/2010  . Cancer of lung ([place]) 04/24/2010    Past Surgical History:  Procedure Laterality Date  . LUNG CANCER SURGERY    . LUNG LOBECTOMY Right   . PACEMAKER INSERTION    . PACEMAKER PLACEMENT      Prior to Admission medications   Medication Sig Start Date End Date Taking? Authorizing Provider  albuterol (PROVENTIL HFA;VENTOLIN HFA) 108 (90 Base) MCG/ACT inhaler Inhale 2 puffs into the lungs every 6 (six) hours as needed for wheezing or shortness of breath. 07/17/16   Orbie Pyo, MD  ALPRAZolam Duanne Moron) 0.25 MG tablet Take 0.25 mg by mouth daily as needed for anxiety.     Historical Provider, MD  apixaban (ELIQUIS) 5 MG TABS tablet Take 1 tablet (5 mg total) by mouth 2 (two) times daily. 11/20/15   Aldean Jewett, MD  cholecalciferol (VITAMIN D) 1000 UNITS tablet Take 2,000 Units by mouth daily.     Historical Provider, MD  cyanocobalamin (,VITAMIN B-12,) 1000 MCG/ML injection Inject 1,000 mcg into the muscle every 30 (thirty) days. Injection once a month    Historical Provider, MD  docusate sodium (COLACE) 100 MG capsule Take 100 mg by mouth 2 (two) times daily.    Historical Provider, MD  donepezil (ARICEPT) 10 MG tablet Take 1 tablet (10 mg total)  by mouth at bedtime. 02/10/16   Cameron Sprang, MD  flecainide (TAMBOCOR) 50 MG tablet Take 1.5 mg by mouth 2 (two) times daily.    Historical Provider, MD  furosemide (LASIX) 20 MG tablet Take 1 tablet (20 mg total) by mouth daily. 11/17/15   Aldean Jewett, MD  gabapentin (NEURONTIN) 300 MG capsule TAKE 4 CAPSULES IN THE MORNING AND 4 CAPSULES IN THE EVENING 04/05/16   Cameron Sprang, MD  gabapentin (NEURONTIN) 300 MG capsule Take 1,200 mg by mouth 2 (two) times daily.    Historical Provider, MD  levalbuterol [facility] HFA) 45 MCG/ACT inhaler Inhale 2 puffs into the lungs every 4 (four) hours as needed for wheezing.    Historical Provider, MD  metoprolol tartrate (LOPRESSOR) 25 MG tablet Take 12.5 mg by mouth.    Historical Provider, MD  pantoprazole (PROTONIX) 40 MG tablet Take 40 mg by mouth daily.     Historical Provider, MD  tiotropium (SPIRIVA) 18 MCG inhalation capsule Place 18 mcg into inhaler and inhale daily.     Historical Provider, MD    Allergies Gluten meal; Gluten meal; and Parabens  Family History  Problem Relation Age of Onset  . Cancer Mother   . Cancer Sister     Social History Social History  Substance Use Topics  . Smoking status: Former Smoker    Packs/day: 1.00    Years: 44.00    Quit date: 11/22/1998  . Smokeless tobacco: Never Used  . Alcohol use Yes     Comment: social    Review of Systems Constitutional: No fever/chills Eyes: No visual changes. ENT: No sore throat. No stiff neck no neck pain Cardiovascular: Denies chest pain. Respiratory: See history of present illness shortness of breath. Gastrointestinal:   no vomiting.  See history of present illness regarding diarrhea.  No constipation. Genitourinary: Negative for dysuria. Musculoskeletal: Negative lower extremity swelling Skin: Negative for rash. Neurological: Negative for severe headaches, focal weakness or numbness. 10-point ROS otherwise  negative.  ____________________________________________   PHYSICAL EXAM:  VITAL SIGNS: ED Triage Vitals  Enc Vitals Group     BP 07/20/16 1134 (!) 98/51     Pulse Rate 07/20/16 1134 68     Resp 07/20/16 1134 20     Temp --      Temp src --      SpO2 07/20/16 1134 95 %     Weight 07/20/16 1136 105 lb (47.6 kg)     Height 07/20/16 1136 '5\' 2"'$  (1.575 m)     Head Circumference --      Peak Flow --      Pain Score 07/20/16 1136 5     Pain Loc --      Pain Edu? --      Excl. in [place]? --     Constitutional: Alert and oriented. Well appearing and in no acute distress. Eyes: Conjunctivae are normal. PERRL. EOMI. Head: Atraumatic. Nose: No congestion/rhinnorhea. Mouth/Throat: Mucous membranes are moist.  Oropharynx non-erythematous., There is some perioral  cyanosis and lingual cyanosis noted, Neck: No stridor.   Nontender with no meningismus Cardiovascular: Normal rate, regular rhythm. Grossly normal heart sounds.  Good peripheral circulation. Respiratory: Normal respiratory effort.  No retractions. Lungs CTAB. Abdominal: Soft and Mild periumbilical tenderness noted with no AAA no guarding no rebound. No distention. No guarding no rebound Back:  There is no focal tenderness or step off.  there is no midline tenderness there are no lesions noted. there is no CVA tenderness Musculoskeletal: No lower extremity tenderness, no upper extremity tenderness. No joint effusions, no DVT signs strong distal pulses no edema Neurologic:  Normal speech and language. No gross focal neurologic deficits are appreciated.  Skin:  Skin is warm, dry and intact. No rash noted. Psychiatric: Mood and affect are normal. Speech and behavior are normal.  ____________________________________________   LABS (all labs ordered are listed, but only abnormal results are displayed)  Labs Reviewed  COMPREHENSIVE METABOLIC PANEL - Abnormal; Notable for the following:       Result Value   Chloride 97 (*)    Glucose,  Bld 122 (*)    BUN 21 (*)    Creatinine, Ser 1.37 (*)    Total Bilirubin 2.0 (*)    GFR calc non Af Amer 36 (*)    GFR calc Af Amer 42 (*)    All other components within normal limits  CBC - Abnormal; Notable for the following:    MCV 107.5 (*)    MCH 37.0 (*)    RDW 15.4 (*)    Platelets 120 (*)    All other components within normal limits  BLOOD GAS, ARTERIAL - Abnormal; Notable for the following:    pH, Arterial 7.47 (*)    pCO2 arterial 31 (*)    pO2, Arterial 81 (*)    Allens test (pass/fail) POSITIVE (*)    All other components within normal limits  LACTIC ACID, PLASMA - Abnormal; Notable for the following:    Lactic Acid, Venous 2.4 (*)    All other components within normal limits  LIPASE, BLOOD  TROPONIN I  URINALYSIS COMPLETEWITH MICROSCOPIC ([facility] ONLY)  LACTIC ACID, PLASMA   ____________________________________________  EKG  I personally interpreted any EKGs ordered by me or triage Paced rhythm, AV sequential pacer rate 62 ____________________________________________  RADIOLOGY  I reviewed any imaging ordered by me or triage that were performed during my shift and, if possible, patient and/or family made aware of any abnormal findings. ____________________________________________   PROCEDURES  Procedure(s) performed: None  Procedures  Critical Care performed: None  ____________________________________________   INITIAL IMPRESSION / ASSESSMENT AND PLAN / ED COURSE  Pertinent labs & imaging results that were available during my care of the patient were reviewed by me and considered in my medical decision making (see chart for details).  Patient presents today complaining of 2 different things. The first is a mildly worsening shortness of breath which she has had for a week. No evidence of CHF clinically. Obtaining chest x-ray. Patient had a recent CT which did not show any acute pathology. She does have COPD and history of lung cancer, this patient  also states that she has had intermittent shortness of breath similar to this in the past. It could be a COPD flare. However, her second complaint is he has had abdominal discomfort for the last several days. She was seen here on the 26th of this month and had a very reassuring workup. She does have some perioral cyanosis appears although her oxygen  saturation is 100% that ABG which is also reassuring, I asked her several times that she's had grape juice and she says no. I'm not clear the etiology of this given her findings. However, it will bear further investigation. Her abdomen is nonsurgical but she is born in 1937 and I think would benefit from imaging given that she has had ongoing a bloating sensation. At this time I have low suspicion that the patient has acute coronary syndrome. I have low suspicion that she has ischemic gut. We'll see what the CT scan shows. She is scheduled. Blood pressures have been running somewhat low here but she is nontoxic in appearance, we are giving her IV fluid and we'll watch her closely.   ----------------------------------------- 3:32 PM on 07/20/2016 -----------------------------------------  Signed out to dr. Kerman Passey at the end of my shift. Clinical Course   ____________________________________________   FINAL CLINICAL IMPRESSION(S) / ED DIAGNOSES  Final diagnoses:  None      This chart was dictated using voice recognition software.  Despite best efforts to proofread,  errors can occur which can change meaning.      Schuyler Amor, MD 07/20/16 1459    Schuyler Amor, MD 07/20/16 [phone]

## 2016-07-20 NOTE — ED Provider Notes (Addendum)
-----------------------------------------   6:00 PM on 07/20/2016 -----------------------------------------  Patient CT scan shows an 11 mm left lower lobe nodule, otherwise no acute abnormality. Patient's labs are largely within normal limits. I discussed with the patient as far as disposition, she would prefer to go home. With a normal CT and labs that feel this is appropriate. Patient has follow-up with oncology next week. Patient sees pulmonology, states she has had increased shortness of breath over the past 2 weeks, I sent Dr. Raul Del a message through [place], the patient will call for a follow-up appointment hopefully this week. Overall the patient appears well. Blood pressure readings around 45-625 systolic. Patient states her normal systolic blood pressures around 100.As the patient overall appears wellwith a normal workup will discharge the patient home with oncology and pulmonology follow   Harvest Dark, MD 07/20/16 1801   After the patient was discharged home her repeat lactate resulted at 3.4. Patient's labs were reviewed, on my Epic results gr they had not been updated for the past 3 hours, after pressing refresh the patient's urinalysis showing a urinary tract infection was evident. I have called the patient back on her cell phone, the patient will be returning to the emergency department. Patient will return to her room, under the same registration number. The patient will be admitted by the hospitalist for further treatment.    Harvest Dark, MD 07/20/16 [phone]

## 2016-07-20 NOTE — ED Triage Notes (Signed)
Pt states she was seen Saturday for shortness of breath, and abdominal pain. Pt was discharged with an Albuterol inhaler. Pt is continuing to complains of abdominal pain. Pt was seen by GI this morning and BP noted to be 87/59. Pt also noted to have bluish fingertips and lips.

## 2016-07-20 NOTE — H&P (Signed)
[place] [facility] [place] NAME: Cheryl Cannon    MR#:  [id number]  DATE OF BIRTH:  October 29, 1936  DATE OF ADMISSION:  07/20/2016  PRIMARY CARE PHYSICIAN: Glendon Axe, MD   REQUESTING/REFERRING PHYSICIAN: Dr. Kerman Passey  CHIEF COMPLAINT:   Chief Complaint  Patient presents with  . Abdominal Pain  . Hypotension    HISTORY OF PRESENT ILLNESS:  Cheryl Cannon  is a 80 y.o. female with a known history of COPD, lung cancer, atrial fibrillation presents to the emergency room complaining of lower abdominal pain. Patient has been found to have UTI along with lactic acidosis and is being admitted to the hospitalist service. CT scan of the abdomen shows nothing acute other than some pelvic nonspecific fluid. Left lower lung 11 mm nodule concerning for metastatic is lung cancer. Patient is also noticed worsening shortness of breath over the last few days and was started on albuterol in addition to her Spiriva. No cough. Patient wears oxygen at nighttime.   PAST MEDICAL HISTORY:   Past Medical History:  Diagnosis Date  . A-fib ([place])   . Cancer ([place])   . Celiac disease   . COPD (chronic obstructive pulmonary disease) ([place])   . Lung cancer ([place])   . Memory deficit     PAST SURGICAL HISTORY:   Past Surgical History:  Procedure Laterality Date  . LUNG CANCER SURGERY    . LUNG LOBECTOMY Right   . PACEMAKER INSERTION    . PACEMAKER PLACEMENT      SOCIAL HISTORY:   Social History  Substance Use Topics  . Smoking status: Former Smoker    Packs/day: 1.00    Years: 44.00    Quit date: 11/22/1998  . Smokeless tobacco: Never Used  . Alcohol use Yes     Comment: social    FAMILY HISTORY:   Family History  Problem Relation Age of Onset  . Cancer Mother   . Cancer Sister     DRUG ALLERGIES:   Allergies  Allergen Reactions  . Gluten Meal Nausea And Vomiting  . Gluten Meal Other (See Comments)    GI UPSET  . Parabens Rash    REVIEW  OF SYSTEMS:   Review of Systems  Constitutional: Positive for chills and malaise/fatigue. Negative for fever and weight loss.  HENT: Negative for hearing loss and nosebleeds.   Eyes: Negative for blurred vision, double vision and pain.  Respiratory: Positive for shortness of breath. Negative for cough, hemoptysis, sputum production and wheezing.   Cardiovascular: Negative for chest pain, palpitations, orthopnea and leg swelling.  Gastrointestinal: Negative for abdominal pain, constipation, diarrhea, nausea and vomiting.  Genitourinary: Positive for dysuria. Negative for hematuria.  Musculoskeletal: Negative for back pain, falls and myalgias.  Skin: Negative for rash.  Neurological: Positive for weakness. Negative for dizziness, tremors, sensory change, speech change, focal weakness, seizures and headaches.  Endo/Heme/Allergies: Does not bruise/bleed easily.  Psychiatric/Behavioral: Negative for depression and memory loss. The patient is not nervous/anxious.     MEDICATIONS AT HOME:   Prior to Admission medications   Medication Sig Start Date End Date Taking? Authorizing Provider  albuterol (PROVENTIL HFA;VENTOLIN HFA) 108 (90 Base) MCG/ACT inhaler Inhale 2 puffs into the lungs every 6 (six) hours as needed for wheezing or shortness of breath. 07/17/16   Orbie Pyo, MD  ALPRAZolam Duanne Moron) 0.25 MG tablet Take 0.25 mg by mouth daily as needed for anxiety.     Historical Provider, MD  apixaban Arne Cleveland) 5  MG TABS tablet Take 1 tablet (5 mg total) by mouth 2 (two) times daily. 11/20/15   Aldean Jewett, MD  cholecalciferol (VITAMIN D) 1000 UNITS tablet Take 2,000 Units by mouth daily.     Historical Provider, MD  cyanocobalamin (,VITAMIN B-12,) 1000 MCG/ML injection Inject 1,000 mcg into the muscle every 30 (thirty) days. Injection once a month    Historical Provider, MD  docusate sodium (COLACE) 100 MG capsule Take 100 mg by mouth 2 (two) times daily.    Historical Provider, MD   donepezil (ARICEPT) 10 MG tablet Take 1 tablet (10 mg total) by mouth at bedtime. 02/10/16   Cameron Sprang, MD  flecainide (TAMBOCOR) 50 MG tablet Take 1.5 mg by mouth 2 (two) times daily.    Historical Provider, MD  furosemide (LASIX) 20 MG tablet Take 1 tablet (20 mg total) by mouth daily. 11/17/15   Aldean Jewett, MD  gabapentin (NEURONTIN) 300 MG capsule Take 1,200 mg by mouth 2 (two) times daily.    Historical Provider, MD  levalbuterol [facility] HFA) 45 MCG/ACT inhaler Inhale 2 puffs into the lungs every 4 (four) hours as needed for wheezing.    Historical Provider, MD  metoprolol tartrate (LOPRESSOR) 25 MG tablet Take 12.5 mg by mouth.    Historical Provider, MD  pantoprazole (PROTONIX) 40 MG tablet Take 40 mg by mouth daily.     Historical Provider, MD  tiotropium (SPIRIVA) 18 MCG inhalation capsule Place 18 mcg into inhaler and inhale daily.     Historical Provider, MD     VITAL SIGNS:  Blood pressure 99/62, pulse 60, resp. rate (!) 21, height '5\' 2"'$  (1.575 m), weight 47.6 kg (105 lb), SpO2 100 %.  PHYSICAL EXAMINATION:  Physical Exam  GENERAL:  80 y.o.-year-old patient lying in the bed with no acute distress.  EYES: Pupils equal, round, reactive to light and accommodation. No scleral icterus. Extraocular muscles intact.  HEENT: Head atraumatic, normocephalic. Oropharynx and nasopharynx clear. No oropharyngeal erythema, moist oral mucosa  NECK:  Supple, no jugular venous distention. No thyroid enlargement, no tenderness.  LUNGS: Increased work of breathing. Decreased air entry bilaterally. CARDIOVASCULAR: S1, S2 normal. No murmurs, rubs, or gallops.  ABDOMEN: Soft, nondistended. Bowel sounds present. No organomegaly or mass. Lower abdominal tenderness on deep palpation EXTREMITIES: No pedal edema, cyanosis, or clubbing. + 2 pedal & radial pulses b/l.   NEUROLOGIC: Cranial nerves II through XII are intact. No focal Motor or sensory deficits appreciated b/l PSYCHIATRIC: The  patient is alert and oriented x 3. Good affect.  SKIN: No obvious rash, lesion, or ulcer.   LABORATORY PANEL:   CBC  Recent Labs Lab 07/20/16 1159  WBC 7.3  HGB 14.1  HCT 40.9  PLT 120*   ------------------------------------------------------------------------------------------------------------------  Chemistries   Recent Labs Lab 07/20/16 1159  NA 135  K 4.1  CL 97*  CO2 26  GLUCOSE 122*  BUN 21*  CREATININE 1.37*  CALCIUM 9.6  AST 40  ALT 19  ALKPHOS 123  BILITOT 2.0*   ------------------------------------------------------------------------------------------------------------------  Cardiac Enzymes  Recent Labs Lab 07/20/16 1159  TROPONINI <0.03   ------------------------------------------------------------------------------------------------------------------  RADIOLOGY:  Dg Chest 2 View  Result Date: 07/20/2016 CLINICAL DATA:  Shortness of breath for 2 days, some mid chest pain, history of lung carcinoma the prior right surgeon EXAM: CHEST  2 VIEW COMPARISON:  CT chest of 07/17/2016 and chest x-ray of 11/16/2015 FINDINGS: Pleural and parenchymal opacity at the right lung base persists consistent with scarring secondary  to prior right lung surgery for lung carcinoma with volume loss. The left lung is clear. The heart is within upper limits normal and dual lead permanent pacemaker remains. No acute bony abnormality is seen. IMPRESSION: 1. Stable postoperative scarring at the right lung base with volume loss on the right. The left lung is clear. 2. Stable mild cardiomegaly with permanent pacemaker. Electronically Signed   By: Ivar Drape M.D.   On: 07/20/2016 12:57   Ct Abdomen Pelvis W Contrast  Result Date: 07/20/2016 CLINICAL DATA:  80 y/o F; generalized abdominal pain. Hypotension. Shortness of breath. History of lung cancer and celiac disease. EXAM: CT ABDOMEN AND PELVIS WITH CONTRAST TECHNIQUE: Multidetector CT imaging of the abdomen and pelvis was  performed using the standard protocol following bolus administration of intravenous contrast. CONTRAST:  47m ISOVUE-300 IOPAMIDOL (ISOVUE-300) INJECTION 61% COMPARISON:  07/17/2016 and 11/12/2015 CT chest. FINDINGS: Lower chest: Partially visualized surgical changes with nonspecific septal thickening and ground-glass opacities, and pleural thickening of the right lung base, possibly representing posttreatment changes. Stable granuloma in the left lower lobe posteriorly. 11 mm nodule within left lower lobe basal medial segment, series 2, image 10, increased in size from 2016. Partially visualized AICD leads.  Small pericardial effusion. Hepatobiliary: No hepatic mass identified. Nonspecific thickening of the wall of the gallbladder and minimal perihepatic ascites. No biliary ductal dilatation. Pancreas: No mass, inflammatory changes, or other significant abnormality. Spleen: Within normal limits in size and appearance. Adrenals/Urinary Tract: No masses identified. No evidence of hydronephrosis. Right kidney interpolar cortical scarring. Stomach/Bowel: No evidence of obstruction, inflammatory process, or abnormal fluid collections. Vascular/Lymphatic: No pathologically enlarged lymph nodes. No abdominal aortic aneurysm. Severe concentric calcific atherosclerosis of the abdominal aorta and bilateral common iliac arteries. Reproductive: No mass or other significant abnormality. Other: Small volume of pelvic ascites. Musculoskeletal: Moderate multilevel degenerative changes of the spine and levocurvature of the lumbar spine with apex at L3. Mild degenerative changes of the hip joints bilaterally. No acute osseous abnormality is identified. IMPRESSION: 1. Growing 11 mm nodule within the left lower lobe, given history of lung cancer this is suspicious for metastatic disease. 2. Small volume of perihepatic and pelvic ascites. 3. Small pericardial effusion. 4. Otherwise no acute process identified. These results will be  called to the ordering clinician or representative by the Radiologist Assistant, and communication documented in the PACS or zVision Dashboard. Electronically Signed   By: LKristine GarbeM.D.   On: 07/20/2016 17:20     IMPRESSION AND PLAN:   * UTI with lactic acidosis Load bolus. Start IV ceftriaxone. Urine cultures sent and pending. Repeat lactic acid after hydration. Mild lower abdominal pain likely due to UTI.  * COPD exacerbation No wheezing but poor air entry. Patient has had worsening shortness of breath over the last few days. We'll start IV steroid dose and prednisone from tomorrow. Nebulizer as needed.  * Paroxysmal atrial fibrillation Continue rate control medications and Eliquis  * History of lung cancer 11 mm lung nodule on CT scan of the chest which patient is aware of. [facility]  * DVT prophylaxis Patient on Eliquis  All the records are reviewed and case discussed with ED provider. Management plans discussed with the patient, family and they are in agreement.  CODE STATUS: FULL CODE  TOTAL TIME TAKING CARE OF THIS PATIENT: 40 minutes.   SHillary BowR M.D on 07/20/2016 at 7:36 PM  Between 7am to 6pm - Pager -  [phone]  After 6pm go to [email or web address] - password [email or web address] [place] [facility]  Office  [phone]  CC: Primary care physician; Glendon Axe, MD  Note: This dictation was prepared with Dragon dictation along with smaller phrase technology. Any transcriptional errors that result from this process are unintentional.

## 2016-07-20 NOTE — ED Notes (Signed)
Pt placed on 2L [place], pt wears home 02 at night, pt states she was at GI PCP and was sent to ED for cyanotic lips and fingertips and SOB, upon arrival pt 98% on RA but states feeling SOB and uncomfortable, states she was seeing GI for bloating and abd discomfort

## 2016-07-21 LAB — LACTIC ACID, PLASMA: Lactic Acid, Venous: 3.4 mmol/L (ref 0.5–1.9)

## 2016-07-21 LAB — BASIC METABOLIC PANEL
Anion gap: 14 (ref 5–15)
BUN: 23 mg/dL — AB (ref 6–20)
CHLORIDE: 98 mmol/L — AB (ref 101–111)
CO2: 21 mmol/L — AB (ref 22–32)
Calcium: 8.5 mg/dL — ABNORMAL LOW (ref 8.9–10.3)
Creatinine, Ser: 1.65 mg/dL — ABNORMAL HIGH (ref 0.44–1.00)
GFR calc Af Amer: 33 mL/min — ABNORMAL LOW (ref >60)
GFR calc non Af Amer: 29 mL/min — ABNORMAL LOW (ref >60)
Glucose, Bld: 176 mg/dL — ABNORMAL HIGH (ref 65–99)
POTASSIUM: 4.3 mmol/L (ref 3.5–5.1)
SODIUM: 133 mmol/L — AB (ref 135–145)

## 2016-07-21 LAB — CBC
HEMATOCRIT: 38.8 % (ref 35.0–47.0)
Hemoglobin: 13.4 g/dL (ref 12.0–16.0)
MCH: 37.3 pg — AB (ref 26.0–34.0)
MCHC: 34.5 g/dL (ref 32.0–36.0)
MCV: 108 fL — AB (ref 80.0–100.0)
Platelets: 120 10*3/uL — ABNORMAL LOW (ref 150–440)
RBC: 3.6 MIL/uL — AB (ref 3.80–5.20)
RDW: 15.5 % — AB (ref 11.5–14.5)
WBC: 5.3 10*3/uL (ref 3.6–11.0)

## 2016-07-21 MED ORDER — PREDNISONE 10 MG PO TABS: ORAL_TABLET | ORAL | 0 refills | Status: DC

## 2016-07-21 MED ORDER — SODIUM CHLORIDE 0.9 % IV BOLUS (SEPSIS)
500.0000 mL | Freq: Once | INTRAVENOUS | Status: AC
  Administered 2016-07-21: 500 mL via INTRAVENOUS

## 2016-07-21 MED ORDER — CEFUROXIME AXETIL 250 MG PO TABS: 250.0000 mg | ORAL_TABLET | Freq: Two times a day (BID) | ORAL | 0 refills | Status: DC

## 2016-07-21 NOTE — Progress Notes (Signed)
Discharge instructions given and went over with patient and husband at bedside. Prescriptions given. Follow up appointments reviewed. All questions answered. Patient discharged home with husband via wheelchair by nursing staff. Madlyn Frankel, RN

## 2016-07-21 NOTE — Progress Notes (Signed)
Speech Language Pathology Treatment: Dysphagia (education on swallowing; Reflux impact on swallowing)  Patient Details Name: Cheryl Cannon MRN: [id number] DOB: Aug 27, 1936 Today's Date: 07/21/2016 Time: 1015-1100 SLP Time Calculation (min) (ACUTE ONLY): 45 min  Assessment / Plan / Recommendation Clinical Impression  Pt seen today per MD's request to address any dysphagia or swallowing deficits. A full evaluation was not indicated based on pt's presentation, however, it was determined that pt and husband would benefit from thorough education on swallowing in general and impact of Reflux on swallowing. Pt, husband and NSG denied any overt s/s of aspiration; labs and MD notes support this. Pt has been eating a regular diet adequately and swallowing pills adequately w/ NSG. Pt does have a dx of GERD baseline and is on a PPI. Discussed pt's c/o a "scratchiness that is in my throat". Pt endorsed s/s of Reflux. This was discussed w/ pt as well as her other c/o vocal quality decline(gravely quality) and increased belching - suspect these issues are related to her baseline GERD. Recommendations given on how to monitor her Reflux, diet/food options, and suggested f/u w/ GI to address Reflux management and her s/s of Reflux. Pt and husband agreed; handouts given. No further skilled ST services indicated at this time. NSG to reconsult if any change in status while admitted.    HPI HPI: Pt is a 80 y.o. female with a known history of GERD, Celiac dis., COPD, lung cancer, atrial fibrillation presents to the emergency room complaining of lower abdominal pain. Patient has been found to have UTI along with lactic acidosis and is being admitted to the hospitalist service. CT scan of the abdomen shows nothing acute other than some pelvic nonspecific fluid. Left lower lung 11 mm nodule concerning for metastatic is lung cancer. Pt has been having worsening SOB in past few days. She describes a "scratchy" feeling in her lower  throat, gravely vocal quality. She endorsed dx of GERD and is on a PPI; husband agreed. She described "episodes of reflux" but that these are transient. She felt it may have been "worse" since feeling sick and having to come to the hospital. Pt denied any swallowing deficits but again c/o a "scratchy" feeling in her throat.       SLP Plan   (No services indicated)     Recommendations  Diet recommendations: Regular;Thin liquid (meats cut well and moistened well) Liquids provided via: Cup;Straw (as is her baseline) Medication Administration: Whole meds with liquid (may consider using Puree is easier) Supervision: Patient able to self feed Compensations: Minimize environmental distractions;Slow rate;Small sips/bites;Follow solids with liquid Postural Changes and/or Swallow Maneuvers: Seated upright 90 degrees;Upright 30-60 min after meal (Reflux precautions)             General recommendations:  (Dietician consult as needed; GI consult as needed) Oral Care Recommendations: Oral care BID;Patient independent with oral care Follow up Recommendations: None Plan:  (No services indicated)     [place], MS, CCC-SLP  Cheryl Cannon 07/21/2016, 11:40 AM

## 2016-07-21 NOTE — Care Management (Signed)
Admitted to [facility] with the diagnosis of urinary tract infection. Lives with husband, Elenore Rota [phone]). No home health. No skilled facility. Home oxygen through [facility] since December 2016. Night only 2 liters per nasal cannula. Prescriptions are filled through [facility]) and [employer]. Takes care of all basic activities of daily living herself, limited drivers license. No falls. Fair appetite. Last seen Dr. Glendon Axe May 23rd. Husband will transport. Discharge to home today per Dr. Lavetta Nielsen. Shelbie Ammons RN MSN CCM Care Management [phone]

## 2016-07-21 NOTE — Progress Notes (Signed)
Lactic Acid critical value called in at 0610 of 3.4.  Dr. Marcille Blanco made aware.  Bolus of 589m NS ordered, then continue with 1560mhr afterwards.

## 2016-07-21 NOTE — Care Management Important Message (Signed)
Important Message  Patient Details  Name: Cheryl Cannon MRN: [id number] Date of Birth: [birth year]   Medicare Important Message Given:  Yes    Shelbie Ammons, RN 07/21/2016, 9:43 AM

## 2016-07-21 NOTE — Discharge Summary (Signed)
[facility] NAME: Cheryl Cannon    MR#:  [id number]  DATE OF BIRTH:  1936/08/21  DATE OF ADMISSION:  07/20/2016 ADMITTING PHYSICIAN: Hillary Bow, MD  DATE OF DISCHARGE: 07/21/2016  PRIMARY CARE PHYSICIAN: Singh,Jasmine, MD    ADMISSION DIAGNOSIS:  Abdominal bloating [R14.0] DOE (dyspnea on exertion) [R06.09] UTI (lower urinary tract infection) [N39.0]  DISCHARGE DIAGNOSIS:  Active Problems:   UTI (lower urinary tract infection)   SECONDARY DIAGNOSIS:   Past Medical History:  Diagnosis Date  . A-fib ([place])   . Cancer ([place])   . Celiac disease   . COPD (chronic obstructive pulmonary disease) ([place])   . Lung cancer ([place])   . Memory deficit     HOSPITAL COURSE:   80 year old female with past medical history of atrial fibrillation, COPD, lung cancer, mild dementia who presented to the hospital due to lower abdominal pain and bloating and noted to have UTI with lactic acidosis also noted to be in COPD exacerbation.  1. Urinary tract infection-this is based off the urinalysis on admission. Cultures were never sent. -Patient is clinically afebrile and hemodynamically stable. She was treated with IV ceftriaxone the hospital and currently being discharged on oral Ceftin.  2. COPD exacerbation-patient's chest x-ray showed pneumonia and clinically patient has no wheezing and bronchospasm. -she was given 1 dose of iv steroids and now being discharged on oral prednisone taper. -She will continue albuterol inhaler, Spiriva and Xopenex nebulizers as needed.  3. Abdominal pain/bloating-etiology unclear but suspected to be secondary to constipation. Patient will continue her MiraLAX as needed. -Patient had a CT abdomen pelvis which showed no evidence of acute pathology.  4. History of chronic atrial fibrillation-patient will continue Eliquis.   -She is rate controlled and she will continue her flecainide.  5. GERD-she will continue  Protonix  6 dementia-she will continue her Aricept.  7. Anxiety-patient with resume her Xanax.    DISCHARGE CONDITIONS:   Stable.   CONSULTS OBTAINED:    DRUG ALLERGIES:   Allergies  Allergen Reactions  . Gluten Meal Nausea And Vomiting  . Gluten Meal Other (See Comments)    GI UPSET  . Parabens Rash    DISCHARGE MEDICATIONS:     Medication List    TAKE these medications   albuterol 108 (90 Base) MCG/ACT inhaler Commonly known as:  PROVENTIL HFA;VENTOLIN HFA Inhale 2 puffs into the lungs every 6 (six) hours as needed for wheezing or shortness of breath.   ALPRAZolam 0.25 MG tablet Commonly known as:  XANAX Take 0.25 mg by mouth daily as needed for anxiety.   apixaban 5 MG Tabs tablet Commonly known as:  ELIQUIS Take 1 tablet (5 mg total) by mouth 2 (two) times daily.   cefUROXime 250 MG tablet Commonly known as:  CEFTIN Take 1 tablet (250 mg total) by mouth 2 (two) times daily with a meal.   cholecalciferol 1000 units tablet Commonly known as:  VITAMIN D Take 2,000 Units by mouth daily.   cyanocobalamin 1000 MCG/ML injection Commonly known as:  (VITAMIN B-12) Inject 1,000 mcg into the muscle every 30 (thirty) days. Injection once a month   docusate sodium 100 MG capsule Commonly known as:  COLACE Take 300 mg by mouth daily.   donepezil 10 MG tablet Commonly known as:  ARICEPT Take 1 tablet (10 mg total) by mouth at bedtime.   flecainide 50 MG tablet Commonly known as:  TAMBOCOR Take 1.5 mg by mouth 2 (two)  times daily.   furosemide 20 MG tablet Commonly known as:  LASIX Take 1 tablet (20 mg total) by mouth daily.   gabapentin 300 MG capsule Commonly known as:  NEURONTIN Take 1,200 mg by mouth 2 (two) times daily.   metoprolol tartrate 25 MG tablet Commonly known as:  LOPRESSOR Take 12.5 mg by mouth at bedtime.   pantoprazole 40 MG tablet Commonly known as:  PROTONIX Take 40 mg by mouth daily.   predniSONE 10 MG tablet Commonly known  as:  DELTASONE Label  & dispense according to the schedule below. 4 Pills PO for 1 day, 3 Pills PO for 1 day, 2 Pills PO for 1 day, 1 Pill PO for 1 days then STOP.   tiotropium 18 MCG inhalation capsule Commonly known as:  SPIRIVA Place 18 mcg into inhaler and inhale daily.   XOPENEX HFA 45 MCG/ACT inhaler Generic drug:  levalbuterol Inhale 2 puffs into the lungs every 4 (four) hours as needed for wheezing.         DISCHARGE INSTRUCTIONS:   DIET:  Cardiac diet  DISCHARGE CONDITION:  Stable  ACTIVITY:  Activity as tolerated  OXYGEN:  Home Oxygen: Yes.     Oxygen Delivery: 2 liters/min via [place] at bedtime  DISCHARGE LOCATION:  home   If you experience worsening of your admission symptoms, develop shortness of breath, life threatening emergency, suicidal or homicidal thoughts you must seek medical attention immediately by calling 911 or calling your MD immediately  if symptoms less severe.  You Must read complete instructions/literature along with all the possible adverse reactions/side effects for all the Medicines you take and that have been prescribed to you. Take any new Medicines after you have completely understood and accpet all the possible adverse reactions/side effects.   Please note  You were cared for by a hospitalist during your hospital stay. If you have any questions about your discharge medications or the care you received while you were in the hospital after you are discharged, you can call the unit and asked to speak with the hospitalist on call if the hospitalist that took care of you is not available. Once you are discharged, your primary care physician will handle any further medical issues. Please note that NO REFILLS for any discharge medications will be authorized once you are discharged, as it is imperative that you return to your primary care physician (or establish a relationship with a primary care physician if you do not have one) for your aftercare  needs so that they can reassess your need for medications and monitor your lab values.     Today   Still complaining of some abdominal bloating.  NO N/V or diarrhea. Afebrile.  Hemodynamically stable.   VITAL SIGNS:  Blood pressure 105/73, pulse 75, temperature 97.3 F (36.3 C), temperature source Oral, resp. rate 16, height '5\' 2"'$  (1.575 m), weight 47.8 kg (105 lb 6.4 oz), SpO2 93 %.  I/O:   Intake/Output Summary (Last 24 hours) at 07/21/16 1246 Last data filed at 07/21/16 0900  Gross per 24 hour  Intake              980 ml  Output                0 ml  Net              980 ml    PHYSICAL EXAMINATION:  GENERAL:  80 y.o.-year-old patient lying in the bed with no acute distress.  EYES:  Pupils equal, round, reactive to light and accommodation. No scleral icterus. Extraocular muscles intact.  HEENT: Head atraumatic, normocephalic. Oropharynx and nasopharynx clear.  NECK:  Supple, no jugular venous distention. No thyroid enlargement, no tenderness.  LUNGS: Normal breath sounds bilaterally, no wheezing, rales,rhonchi. No use of accessory muscles of respiration.  CARDIOVASCULAR: S1, S2 normal. No murmurs, rubs, or gallops.  ABDOMEN: Soft, non-tender, non-distended. Bowel sounds present. No organomegaly or mass.  EXTREMITIES: No pedal edema, cyanosis, or clubbing.  NEUROLOGIC: Cranial nerves II through XII are intact. No focal motor or sensory defecits b/l.  PSYCHIATRIC: The patient is alert and oriented x 3. Good affect.  SKIN: No obvious rash, lesion, or ulcer.   DATA REVIEW:   CBC  Recent Labs Lab 07/21/16 0451  WBC 5.3  HGB 13.4  HCT 38.8  PLT 120*    Chemistries   Recent Labs Lab 07/20/16 1159 07/21/16 0451  NA 135 133*  K 4.1 4.3  CL 97* 98*  CO2 26 21*  GLUCOSE 122* 176*  BUN 21* 23*  CREATININE 1.37* 1.65*  CALCIUM 9.6 8.5*  AST 40  --   ALT 19  --   ALKPHOS 123  --   BILITOT 2.0*  --     Cardiac Enzymes  Recent Labs Lab 07/20/16 1159   TROPONINI <0.03    Microbiology Results  Results for orders placed or performed during the hospital encounter of 11/15/15  Culture, blood (routine x 2) Call MD if unable to obtain prior to antibiotics being given     Status: None   Collection Time: 11/15/15 10:17 AM  Result Value Ref Range Status   Specimen Description BLOOD LEFT HAND  Final   Special Requests BOTTLES DRAWN AEROBIC AND ANAEROBIC  1CC  Final   Culture NO GROWTH 5 DAYS  Final   Report Status 11/20/2015 FINAL  Final  Culture, blood (routine x 2) Call MD if unable to obtain prior to antibiotics being given     Status: None   Collection Time: 11/15/15 10:17 AM  Result Value Ref Range Status   Specimen Description BLOOD RIGHT [facility]  Final   Special Requests BOTTLES DRAWN AEROBIC AND ANAEROBIC  10CC  Final   Culture NO GROWTH 5 DAYS  Final   Report Status 11/20/2015 FINAL  Final    RADIOLOGY:  Dg Chest 2 View  Result Date: 07/20/2016 CLINICAL DATA:  Shortness of breath for 2 days, some mid chest pain, history of lung carcinoma the prior right surgeon EXAM: CHEST  2 VIEW COMPARISON:  CT chest of 07/17/2016 and chest x-ray of 11/16/2015 FINDINGS: Pleural and parenchymal opacity at the right lung base persists consistent with scarring secondary to prior right lung surgery for lung carcinoma with volume loss. The left lung is clear. The heart is within upper limits normal and dual lead permanent pacemaker remains. No acute bony abnormality is seen. IMPRESSION: 1. Stable postoperative scarring at the right lung base with volume loss on the right. The left lung is clear. 2. Stable mild cardiomegaly with permanent pacemaker. Electronically Signed   By: Ivar Drape M.D.   On: 07/20/2016 12:57   Ct Abdomen Pelvis W Contrast  Result Date: 07/20/2016 CLINICAL DATA:  80 y/o F; generalized abdominal pain. Hypotension. Shortness of breath. History of lung cancer and celiac disease. EXAM: CT ABDOMEN AND PELVIS WITH CONTRAST TECHNIQUE:  Multidetector CT imaging of the abdomen and pelvis was performed using the standard protocol following bolus administration of intravenous contrast. CONTRAST:  61m  ISOVUE-300 IOPAMIDOL (ISOVUE-300) INJECTION 61% COMPARISON:  07/17/2016 and 11/12/2015 CT chest. FINDINGS: Lower chest: Partially visualized surgical changes with nonspecific septal thickening and ground-glass opacities, and pleural thickening of the right lung base, possibly representing posttreatment changes. Stable granuloma in the left lower lobe posteriorly. 11 mm nodule within left lower lobe basal medial segment, series 2, image 10, increased in size from 2016. Partially visualized AICD leads.  Small pericardial effusion. Hepatobiliary: No hepatic mass identified. Nonspecific thickening of the wall of the gallbladder and minimal perihepatic ascites. No biliary ductal dilatation. Pancreas: No mass, inflammatory changes, or other significant abnormality. Spleen: Within normal limits in size and appearance. Adrenals/Urinary Tract: No masses identified. No evidence of hydronephrosis. Right kidney interpolar cortical scarring. Stomach/Bowel: No evidence of obstruction, inflammatory process, or abnormal fluid collections. Vascular/Lymphatic: No pathologically enlarged lymph nodes. No abdominal aortic aneurysm. Severe concentric calcific atherosclerosis of the abdominal aorta and bilateral common iliac arteries. Reproductive: No mass or other significant abnormality. Other: Small volume of pelvic ascites. Musculoskeletal: Moderate multilevel degenerative changes of the spine and levocurvature of the lumbar spine with apex at L3. Mild degenerative changes of the hip joints bilaterally. No acute osseous abnormality is identified. IMPRESSION: 1. Growing 11 mm nodule within the left lower lobe, given history of lung cancer this is suspicious for metastatic disease. 2. Small volume of perihepatic and pelvic ascites. 3. Small pericardial effusion. 4. Otherwise  no acute process identified. These results will be called to the ordering clinician or representative by the Radiologist Assistant, and communication documented in the PACS or zVision Dashboard. Electronically Signed   By: Kristine Garbe M.D.   On: 07/20/2016 17:20      Management plans discussed with the patient, family and they are in agreement.  CODE STATUS:     Code Status Orders        Start     Ordered   07/20/16 1933  Full code  Continuous     07/20/16 1933    Code Status History    Date Active Date Inactive Code Status Order ID Comments User Context   11/15/2015  9:41 AM 11/17/2015  6:55 PM Full Code [id number]  Saundra Shelling, MD Inpatient   11/12/2015  5:46 AM 11/13/2015  8:12 PM Full Code [id number]  Harrie Foreman, MD ED    Advance Directive Documentation   Flowsheet Row Most Recent Value  Type of Advance Directive  Healthcare Power of Attorney, Living will  Pre-existing out of facility DNR order (yellow form or pink MOST form)  No data  "MOST" Form in Place?  No data      TOTAL TIME TAKING CARE OF THIS PATIENT: 40 minutes.    Henreitta Leber M.D on 07/21/2016 at 12:46 PM  Between 7am to 6pm - Pager - [phone]  After 6pm go to [email or web address] - Proofreader  [facility]  Office  [phone]  CC: Primary care physician; Glendon Axe, MD

## 2016-09-14 ENCOUNTER — Telehealth: Payer: Self-pay | Admitting: Neurology

## 2016-09-14 ENCOUNTER — Ambulatory Visit (INDEPENDENT_AMBULATORY_CARE_PROVIDER_SITE_OTHER): Payer: Medicare Other | Admitting: Neurology

## 2016-09-14 ENCOUNTER — Other Ambulatory Visit: Payer: Self-pay

## 2016-09-14 ENCOUNTER — Encounter: Payer: Self-pay | Admitting: Neurology

## 2016-09-14 VITALS — BP 106/62 | HR 76 | Ht 62.0 in | Wt 110.1 lb

## 2016-09-14 DIAGNOSIS — G609 Hereditary and idiopathic neuropathy, unspecified: Secondary | ICD-10-CM | POA: Diagnosis not present

## 2016-09-14 DIAGNOSIS — R251 Tremor, unspecified: Secondary | ICD-10-CM

## 2016-09-14 DIAGNOSIS — F0390 Unspecified dementia without behavioral disturbance: Secondary | ICD-10-CM

## 2016-09-14 DIAGNOSIS — F03A0 Unspecified dementia, mild, without behavioral disturbance, psychotic disturbance, mood disturbance, and anxiety: Secondary | ICD-10-CM

## 2016-09-14 MED ORDER — GABAPENTIN 300 MG PO CAPS: 1200.0000 mg | ORAL_CAPSULE | Freq: Two times a day (BID) | ORAL | 3 refills | Status: DC

## 2016-09-14 MED ORDER — MEMANTINE HCL 10 MG PO TABS: ORAL_TABLET | ORAL | 3 refills | Status: AC

## 2016-09-14 MED ORDER — DONEPEZIL HCL 10 MG PO TABS: 10.0000 mg | ORAL_TABLET | Freq: Every day | ORAL | 3 refills | Status: AC

## 2016-09-14 MED ORDER — DONEPEZIL HCL 10 MG PO TABS: 10.0000 mg | ORAL_TABLET | Freq: Every day | ORAL | 3 refills | Status: DC

## 2016-09-14 MED ORDER — MEMANTINE HCL 10 MG PO TABS: ORAL_TABLET | ORAL | 3 refills | Status: DC

## 2016-09-14 MED ORDER — GABAPENTIN 300 MG PO CAPS: 1200.0000 mg | ORAL_CAPSULE | Freq: Two times a day (BID) | ORAL | 3 refills | Status: AC

## 2016-09-14 NOTE — Patient Instructions (Signed)
1. Start Namenda '10mg'$ : Take 1 tablet daily for 1 week, then increase to 1 tablet twice a day 2. Continue Aricept '10mg'$  daily 3. Continue Gabapentin '300mg'$ : Take 4 capsules twice a day 4. Continue to monitor mood 5. Control of blood pressure, cholesterol, as well as physical exercise and brain stimulation exercises are important for brain health 6. Follow-up in 3 months, call for any changes

## 2016-09-14 NOTE — Telephone Encounter (Signed)
Contacted patient and [facility]

## 2016-09-14 NOTE — Telephone Encounter (Signed)
PT called and said the prescription was sent to the wrong place/[facility] CB# [phone]

## 2016-09-14 NOTE — Progress Notes (Signed)
NEUROLOGY FOLLOW UP OFFICE NOTE  Cheryl Cannon [id number]  HISTORY OF PRESENT ILLNESS: I had the pleasure of seeing Cheryl Cannon in follow-up in the neurology clinic on 09/14/2016.  The patient was last seen 7 months ago for mild dementia and peripheral neuropathy. She is again accompanied by her husband who helps supplement the history today. MMSE in March 2017 was 26/30. She is taking Aricept '10mg'$  daily without side effects. She feels her memory is worse. She does very minimal driving without getting lost. She continues to arrange her pillbox without difficulties. Her husband has always been in charge of bills. Her husband has noticed a lot of repeating herself and analyzing situations. No personality changes, hallucinations. They have noticed an emotional effect where she gets frustrated and makes everything else worse. Since her last visit, she has noticed a new tremor in either hand, occurring intermittently, sometimes more noticeable when she is writing at night. It does not affect her eating. She is independent with ADLs. She also has neuropathy which is well-controlled on gabapentin '1200mg'$  BID, she denies any pain in her hands or feet today. She denies any headaches, dizziness, diplopia, dysarthria/dysphagia, neck/back pain, bowel/bladder dysfunction. No falls.  HPI: This is a very pleasant 80 yo RH woman with a history of non-small cell lung cancer s/p surgery, chemotherapy and radiation, atrial fibrillation s/p pacemaker placement, celiac disease, with mild dementia and neuropathy.  1. Memory loss. She and her husband started noticing memory changes at the beginning of 2015. She reports that her "memory has never been great," but that it became more noticeable where she would forget names of familiar people or forget recent conversations, repeat questions. She occasionally has word-finding difficulties, and has noticed it more lately. Her husband has always made sure she takes her  medications. She cooks and has not left the stove on. She drove without any difficulties, and after recent neuropsychological evaluation, she underwent a road test with the [facility] and has a restricted license. Her husband has been in charge of the bills since her diagnosis of cancer in 2010. Her husband also noted difficulties with reasoning and problem solving. No personality changes or hallucinations. She had seen neurologist Dr. Carmelina Paddock, head CT with and without contrast done in 03/2014 did not show any acute changes, no evidence of brain metastasis. Routine EEG 02/2014 normal. She underwent Neuropsychological evaluation on 03/28/14, results reviewed, impression of mild dementia most likely secondary to Alzheimer's disease (rule out mixed dementia), with significant deficits in memory, semantic fluency, processing speed, and aspects of executive functioning. There was also evidence that these deficits are beginning to interfere with ability to perform complex tasks. Cognitive profile reflective of both cortical and subcortical involvement. There is evidence of hippocampal consolidation dysfunction, which is highly concerning for Alzheimer's disease. She also has several vascular risk factors which could be contributing to subcortical dysfunction and a mixed dementia. She started Aricept, with no side effects except she now "dreams a lot," affecting her sleep.  2. Neuropathy. She reports numbness and tingling in her fingertips over the past year. She states that symptoms "seemed to progress" after she was diagnosed with neuropathy in 03/2014. She now has paresthesias (tingling) from the bottom of her feet to the top of her head. She can't taste as much due to tingling in her tongue. 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 Needle exam not done due to anticoagulation with Pradaxa. Neuropathy labs available indicate normal SPEP. She is  on B12 replacement. She had  been taking gabapentin '900mg'$  BID for 3 years for shingles in the buttock region. She had tried increasing this to '900mg'$  TID but did not notice much difference, no side effects. She denies any neck or back pain. She has occasional generalized body weakness where "my legs won't work," attributed to shortness of breath. No bowel/bladder dysfunction. Celiac disease is controlled on strict diet.   PAST MEDICAL HISTORY: Past Medical History:  Diagnosis Date  . A-fib ([place])   . Cancer ([place])   . Celiac disease   . COPD (chronic obstructive pulmonary disease) ([place])   . Lung cancer ([place])   . Memory deficit     MEDICATIONS: Current Outpatient Prescriptions on File Prior to Visit  Medication Sig Dispense Refill  . albuterol (PROVENTIL HFA;VENTOLIN HFA) 108 (90 Base) MCG/ACT inhaler Inhale 2 puffs into the lungs every 6 (six) hours as needed for wheezing or shortness of breath. 1 Inhaler 0  . ALPRAZolam (XANAX) 0.25 MG tablet Take 0.25 mg by mouth daily as needed for anxiety.     Marland Kitchen apixaban (ELIQUIS) 5 MG TABS tablet Take 1 tablet (5 mg total) by mouth 2 (two) times daily. 60 tablet 0  . cefUROXime (CEFTIN) 250 MG tablet Take 1 tablet (250 mg total) by mouth 2 (two) times daily with a meal. 10 tablet 0  . cholecalciferol (VITAMIN D) 1000 UNITS tablet Take 2,000 Units by mouth daily.     . cyanocobalamin (,VITAMIN B-12,) 1000 MCG/ML injection Inject 1,000 mcg into the muscle every 30 (thirty) days. Injection once a month    . docusate sodium (COLACE) 100 MG capsule Take 300 mg by mouth daily.     Marland Kitchen donepezil (ARICEPT) 10 MG tablet Take 1 tablet (10 mg total) by mouth at bedtime. 90 tablet 3  . flecainide (TAMBOCOR) 50 MG tablet Take 1.5 mg by mouth 2 (two) times daily.    . furosemide (LASIX) 20 MG tablet Take 1 tablet (20 mg total) by mouth daily. 30 tablet 0  . gabapentin (NEURONTIN) 300 MG capsule Take 1,200 mg by mouth 2 (two) times daily.    Marland Kitchen levalbuterol (XOPENEX HFA) 45 MCG/ACT inhaler Inhale  2 puffs into the lungs every 4 (four) hours as needed for wheezing.    . metoprolol tartrate (LOPRESSOR) 25 MG tablet Take 12.5 mg by mouth at bedtime.     . pantoprazole (PROTONIX) 40 MG tablet Take 40 mg by mouth daily.     . predniSONE (DELTASONE) 10 MG tablet Label  & dispense according to the schedule below. 4 Pills PO for 1 day, 3 Pills PO for 1 day, 2 Pills PO for 1 day, 1 Pill PO for 1 days then STOP. 10 tablet 0  . tiotropium (SPIRIVA) 18 MCG inhalation capsule Place 18 mcg into inhaler and inhale daily.      No current facility-administered medications on file prior to visit.     ALLERGIES: Allergies  Allergen Reactions  . Gluten Meal Nausea And Vomiting  . Gluten Meal Other (See Comments)    GI UPSET  . Parabens Rash    FAMILY HISTORY: Family History  Problem Relation Age of Onset  . Cancer Mother   . Cancer Sister     SOCIAL HISTORY: Social History   Social History  . Marital status: Married    Spouse name: N/A  . Number of children: N/A  . Years of education: N/A   Occupational History  . Not on file.  Social History Main Topics  . Smoking status: Former Smoker    Packs/day: 1.00    Years: 44.00    Quit date: 11/22/1998  . Smokeless tobacco: Never Used  . Alcohol use Yes     Comment: social  . Drug use: No  . Sexual activity: Yes    Birth control/ protection: Other-see comments     Comment: post menopausal   Other Topics Concern  . Not on file   Social History Narrative   ** Merged History Encounter **        REVIEW OF SYSTEMS: Constitutional: No fevers, chills, or sweats, no generalized fatigue, change in appetite Eyes: No visual changes, double vision, eye pain Ear, nose and throat: No hearing loss, ear pain, nasal congestion, sore throat Cardiovascular: No chest pain, palpitations Respiratory:  No shortness of breath at rest or with exertion, wheezes GastrointestinaI: No nausea, vomiting, diarrhea, abdominal pain, fecal  incontinence Genitourinary:  No dysuria, urinary retention or frequency Musculoskeletal:  No neck pain, back pain Integumentary: No rash, pruritus, skin lesions Neurological: as above Psychiatric: No depression, insomnia, anxiety Endocrine: No palpitations, fatigue, diaphoresis, mood swings, change in appetite, change in weight, increased thirst Hematologic/Lymphatic:  No anemia, purpura, petechiae. Allergic/Immunologic: no itchy/runny eyes, nasal congestion, recent allergic reactions, rashes  PHYSICAL EXAM: Vitals:   09/14/16 1107  BP: 106/62  Pulse: 76   General: No acute distress Head:  Normocephalic/atraumatic Neck: supple, no paraspinal tenderness, full range of motion Heart:  Regular rate and rhythm Lungs:  Clear to auscultation bilaterally Back: No paraspinal tenderness Skin/Extremities: No rash, no edema Neurological Exam: alert and oriented to person, place, and time. No aphasia or dysarthria. Fund of knowledge is appropriate.  Remote memory intact. 3/3 delayed recall.  Attention and concentration are normal.    Able to name objects and repeat phrases. CDT 4/5 MMSE - Mini Mental State Exam 09/16/2016 02/10/2016  Orientation to time 2 5  Orientation to Place 3 5  Registration 3 3  Attention/ Calculation 5 4  Recall 0 0  Language- name 2 objects 2 2  Language- repeat 1 1  Language- follow 3 step command 3 3  Language- read & follow direction 1 1  Write a sentence 1 1  Copy design 1 1  Total score 22 26    Cranial nerves: Pupils equal, round, reactive to light.  Extraocular movements intact with no nystagmus. Visual fields full. Facial sensation intact. No facial asymmetry. Tongue, uvula, palate midline.  Motor: Bulk and tone normal, muscle strength 5/5 throughout with no pronator drift.  Sensation to light touch intact.  No extinction to double simultaneous stimulation.  Deep tendon reflexes 2+ throughout except for absent ankle jerks bilaterally, toes downgoing.  Finger  to nose testing intact.  Gait narrow-based and steady, able to tandem walk adequately.  Romberg negative.  IMPRESSION: This is a very pleasant 80 yo RH woman with a history of mild dementia and sensorimotor polyneuropathy, etiology possibly due to chemotherapy. She also has celiac disease which can also cause neuropathy. Celiac disease is under control with diet. She feels memory has worsened, MMSE today 22/30 (26/30 in March 2017), there has been a decline since her last visit while on Aricept. She is agreeable to adding on Namenda '10mg'$  daily for 1 week, then increase to '10mg'$  BID. Side effects were discussed. We also discussed frustration and anxiety, she may benefit from starting an SSRI if this continues, as anxiety can worsen memory. She denies any pain today, continue  gabapentin '1200mg'$  BID. The importance of physical exercise, brain stimulation exercises, and control of vascular risk factors for brain health were again discussed. She is also reporting a tremor, no parkinsonian signs today, no tremor noted in office. This appears to either be enhanced physiologic tremor vs essential tremor, continue to monitor clinically for now. All her questions were answered. She will follow-up in 3 months and knows to call our office for any changes  Thank you for allowing me to participate in her care.  Please do not hesitate to call for any questions or concerns.  The duration of this appointment visit was 25 minutes of face-to-face time with the patient.  Greater than 50% of this time was spent in counseling, explanation of diagnosis, planning of further management, and coordination of care.   Ellouise Newer, M.D.   CC: Dr. Candiss Norse

## 2016-09-15 ENCOUNTER — Encounter: Payer: Self-pay | Admitting: Neurology

## 2016-09-30 ENCOUNTER — Telehealth: Payer: Self-pay | Admitting: Neurology

## 2016-09-30 NOTE — Telephone Encounter (Signed)
Was this message meant for Dr. Delice Lesch?

## 2016-09-30 NOTE — Telephone Encounter (Signed)
She can hold evening dosage.  Then can ask Dr. Delice Lesch tomorrow about further recommendations.

## 2016-09-30 NOTE — Telephone Encounter (Signed)
Notified patient. Will follow up with Dr. Delice Lesch in the morning.

## 2016-09-30 NOTE — Telephone Encounter (Signed)
Contacted patient. She states taking the Namenda once a day was tolerable but she could feel a slight difference. She states she has been taking 1 tab twice a day for a week and it makes her feel very unstable. Wants to know if she can stop medication?

## 2016-09-30 NOTE — Telephone Encounter (Signed)
Cheryl Cannon 07-07-2036.  Cell # [phone] Home# [phone]. She is taking Memantine one tablet but now up to two tablets. She would like to stop taking it. It makes her feel unstable and her head fuzzy. Thank  you

## 2016-09-30 NOTE — Telephone Encounter (Signed)
No. Patient wanted me to see if I could ask another provider today since Dr. Delice Lesch is gone for the evening and she doesn't feel like she should take her evening dose but doesn't want to stop medication without an okay. Thank you.

## 2016-10-01 NOTE — Telephone Encounter (Signed)
Please advise 

## 2016-10-01 NOTE — Telephone Encounter (Signed)
Does she want to try to do 1/2 tablet at night and we will increase very slowly later on?

## 2016-10-01 NOTE — Telephone Encounter (Signed)
Patient notified. She states she will try 1/2 tablet at night and will contact office if this does not work.

## 2016-10-05 ENCOUNTER — Telehealth: Payer: Self-pay | Admitting: Neurology

## 2016-10-05 NOTE — Telephone Encounter (Signed)
PT called in regards to some paperwork that was dropped off to be filled  Out/Dawn CB# [phone] or [phone]

## 2016-10-06 DIAGNOSIS — Z029 Encounter for administrative examinations, unspecified: Secondary | ICD-10-CM

## 2016-10-06 NOTE — Telephone Encounter (Signed)
Patient notified DMV paperwork is completed and ready to pick up.

## 2016-11-01 HISTORY — PX: COLONOSCOPY: SHX174

## 2016-11-02 ENCOUNTER — Encounter: Payer: Self-pay | Admitting: Emergency Medicine

## 2016-11-02 ENCOUNTER — Emergency Department: Payer: Medicare Other

## 2016-11-02 ENCOUNTER — Emergency Department
Admission: EM | Admit: 2016-11-02 | Discharge: 2016-11-03 | Disposition: A | Discharge status: Home / Self Care | Payer: Medicare Other | Attending: Emergency Medicine | Admitting: Emergency Medicine

## 2016-11-02 DIAGNOSIS — I5033 Acute on chronic diastolic (congestive) heart failure: Secondary | ICD-10-CM | POA: Diagnosis not present

## 2016-11-02 DIAGNOSIS — R0602 Shortness of breath: Secondary | ICD-10-CM | POA: Diagnosis not present

## 2016-11-02 DIAGNOSIS — R5383 Other fatigue: Secondary | ICD-10-CM | POA: Diagnosis present

## 2016-11-02 DIAGNOSIS — Z85118 Personal history of other malignant neoplasm of bronchus and lung: Secondary | ICD-10-CM | POA: Diagnosis not present

## 2016-11-02 DIAGNOSIS — F17200 Nicotine dependence, unspecified, uncomplicated: Secondary | ICD-10-CM | POA: Insufficient documentation

## 2016-11-02 DIAGNOSIS — Z950 Presence of cardiac pacemaker: Secondary | ICD-10-CM | POA: Insufficient documentation

## 2016-11-02 DIAGNOSIS — J449 Chronic obstructive pulmonary disease, unspecified: Secondary | ICD-10-CM | POA: Diagnosis not present

## 2016-11-02 DIAGNOSIS — R410 Disorientation, unspecified: Secondary | ICD-10-CM | POA: Insufficient documentation

## 2016-11-02 DIAGNOSIS — I110 Hypertensive heart disease with heart failure: Secondary | ICD-10-CM | POA: Insufficient documentation

## 2016-11-02 DIAGNOSIS — Z79899 Other long term (current) drug therapy: Secondary | ICD-10-CM | POA: Diagnosis not present

## 2016-11-02 LAB — BASIC METABOLIC PANEL
Anion gap: 10 (ref 5–15)
BUN: 13 mg/dL (ref 6–20)
CALCIUM: 9.1 mg/dL (ref 8.9–10.3)
CHLORIDE: 100 mmol/L — AB (ref 101–111)
CO2: 25 mmol/L (ref 22–32)
CREATININE: 1.19 mg/dL — AB (ref 0.44–1.00)
GFR calc Af Amer: 49 mL/min — ABNORMAL LOW (ref >60)
GFR calc non Af Amer: 43 mL/min — ABNORMAL LOW (ref >60)
GLUCOSE: 127 mg/dL — AB (ref 65–99)
Potassium: 3.9 mmol/L (ref 3.5–5.1)
Sodium: 135 mmol/L (ref 135–145)

## 2016-11-02 LAB — INFLUENZA PANEL BY PCR (TYPE A & B)
INFLAPCR: NEGATIVE
INFLBPCR: NEGATIVE

## 2016-11-02 LAB — CBC
HCT: 35.8 % (ref 35.0–47.0)
Hemoglobin: 12.2 g/dL (ref 12.0–16.0)
MCH: 35.1 pg — AB (ref 26.0–34.0)
MCHC: 34.2 g/dL (ref 32.0–36.0)
MCV: 102.6 fL — AB (ref 80.0–100.0)
PLATELETS: 122 10*3/uL — AB (ref 150–440)
RBC: 3.49 MIL/uL — ABNORMAL LOW (ref 3.80–5.20)
RDW: 15.6 % — AB (ref 11.5–14.5)
WBC: 7.9 10*3/uL (ref 3.6–11.0)

## 2016-11-02 LAB — TROPONIN I: Troponin I: <0.03 ng/mL (ref <0.03)

## 2016-11-02 NOTE — ED Provider Notes (Signed)
[facility] Emergency Department Provider Note  ____________________________________________  Time seen: Approximately 10:04 PM  I have reviewed the triage vital signs and the nursing notes.   HISTORY  Chief Complaint Urinary Tract Infection   HPI PURITY IRMEN is a 80 y.o. female with a history of lung cancer in remission, COPD on 2L [place] at bedtime, celiac disease, atrial fibrillation on Eliuiqs and dementia who presents for malaise, fever, chills. Patient underwent a colonoscopy yesterday. According to the husband she was doing well until this afternoon when she started having chills, she felt hot to the touch, she is complaining of exertion and just feeling overall fatigued and and with malaise. Husband also reports the patient is been having harder time expressing herself and has been more confused than normal. Husband says patient has had trouble speaking in the past when she gets sick. No coughing, no abdominal pain. Patient has not had a bowel movement since the colonoscopy. No dysuria or hematuria, no nausea or vomiting, no chest pain.  Past Medical History:  Diagnosis Date  . A-fib ([place])   . Cancer ([place])   . Celiac disease   . COPD (chronic obstructive pulmonary disease) ([place])   . Lung cancer ([place])   . Memory deficit     Patient Active Problem List   Diagnosis Date Noted  . UTI (lower urinary tract infection) 07/20/2016  . Acute on chronic diastolic heart failure ([place]) 01/06/2016  . Pulmonary embolism ([place]) 11/20/2015  . Pneumonia 11/15/2015  . CAP (community acquired pneumonia) 11/12/2015  . Mild chronic obstructive pulmonary disease ([place]) 10/14/2015  . B12 deficiency 10/14/2015  . Pulmonary hypertension 09/25/2015  . Benign essential HTN 09/04/2015  . MI (mitral incompetence) 09/04/2015  . TI (tricuspid incompetence) 09/04/2015  . Can't get food down 08/13/2015  . Insomnia, persistent 07/14/2015  . Mild dementia 07/14/2015  . H/O  gastrointestinal disease 06/09/2015  . Hereditary and idiopathic peripheral neuropathy 05/12/2015  . D (diarrhea) 11/25/2014  . Chronic obstructive pulmonary disease ([place]) 10/20/2014  . Sensory neuropathy ([place]) 10/20/2014  . Neuropathy ([place]) 08/16/2014  . Dementia 08/16/2014  . Lung cancer ([place]) 08/16/2014  . Celiac disease 08/16/2014  . Atrial fibrillation ([place]) 08/16/2014  . Breath shortness 06/13/2012  . Encounter for adjustment and management of other part of cardiac pacemaker 05/10/2012  . Non-small cell carcinoma of lung ([place]) 09/08/2011  . Artificial cardiac pacemaker 09/08/2011  . Malignant pleural effusion 08/20/2010  . Cancer of lung ([place]) 04/24/2010    Past Surgical History:  Procedure Laterality Date  . LUNG CANCER SURGERY    . LUNG LOBECTOMY Right   . PACEMAKER INSERTION    . PACEMAKER PLACEMENT      Prior to Admission medications   Medication Sig Start Date End Date Taking? Authorizing Provider  albuterol (PROVENTIL HFA;VENTOLIN HFA) 108 (90 Base) MCG/ACT inhaler Inhale 2 puffs into the lungs every 6 (six) hours as needed for wheezing or shortness of breath. 07/17/16   Orbie Pyo, MD  ALPRAZolam Duanne Moron) 0.25 MG tablet Take 0.25 mg by mouth daily as needed for anxiety.     Historical Provider, MD  apixaban (ELIQUIS) 5 MG TABS tablet Take 1 tablet (5 mg total) by mouth 2 (two) times daily. 11/20/15   Aldean Jewett, MD  cefUROXime (CEFTIN) 250 MG tablet Take 1 tablet (250 mg total) by mouth 2 (two) times daily with a meal. 07/21/16   Henreitta Leber, MD  cholecalciferol (VITAMIN D) 1000 UNITS tablet Take  2,000 Units by mouth daily.     Historical Provider, MD  cyanocobalamin (,VITAMIN B-12,) 1000 MCG/ML injection Inject 1,000 mcg into the muscle every 30 (thirty) days. Injection once a month    Historical Provider, MD  docusate sodium (COLACE) 100 MG capsule Take 300 mg by mouth daily.     Historical Provider, MD  donepezil (ARICEPT) 10 MG tablet Take 1  tablet (10 mg total) by mouth at bedtime. 09/14/16   Cameron Sprang, MD  flecainide (TAMBOCOR) 50 MG tablet Take 1.5 mg by mouth 2 (two) times daily.    Historical Provider, MD  furosemide (LASIX) 20 MG tablet Take 1 tablet (20 mg total) by mouth daily. 11/17/15   Aldean Jewett, MD  gabapentin (NEURONTIN) 300 MG capsule Take 4 capsules (1,200 mg total) by mouth 2 (two) times daily. 09/14/16   Cameron Sprang, MD  levalbuterol [facility] HFA) 45 MCG/ACT inhaler Inhale 2 puffs into the lungs every 4 (four) hours as needed for wheezing.    Historical Provider, MD  memantine (NAMENDA) 10 MG tablet Take 1 tablet daily for 1 week, then increase to 1 tablet twice a day 09/14/16   Cameron Sprang, MD  metoprolol tartrate (LOPRESSOR) 25 MG tablet Take 12.5 mg by mouth at bedtime.     Historical Provider, MD  pantoprazole (PROTONIX) 40 MG tablet Take 40 mg by mouth daily.     Historical Provider, MD  predniSONE (DELTASONE) 10 MG tablet Label  & dispense according to the schedule below. 4 Pills PO for 1 day, 3 Pills PO for 1 day, 2 Pills PO for 1 day, 1 Pill PO for 1 days then STOP. 07/21/16   Henreitta Leber, MD  tiotropium (SPIRIVA) 18 MCG inhalation capsule Place 18 mcg into inhaler and inhale daily.     Historical Provider, MD    Allergies Gluten meal; Gluten meal; and Parabens  Family History  Problem Relation Age of Onset  . Cancer Mother   . Cancer Sister     Social History Social History  Substance Use Topics  . Smoking status: Current Some Day Smoker    Packs/day: 1.00    Years: 44.00    Last attempt to quit: 11/22/1998  . Smokeless tobacco: Never Used  . Alcohol use Yes     Comment: social    Review of Systems  Constitutional: Negative for fever. Eyes: Negative for visual changes. ENT: Negative for sore throat. Neck: No neck pain  Cardiovascular: Negative for chest pain. Respiratory: + shortness of breath. Gastrointestinal: Negative for abdominal pain, vomiting or  diarrhea. Genitourinary: Negative for dysuria. Musculoskeletal: Negative for back pain. Skin: Negative for rash. Neurological: Negative for headaches, weakness or numbness. Psych: No SI or HI  ____________________________________________   PHYSICAL EXAM:  VITAL SIGNS: ED Triage Vitals  Enc Vitals Group     BP 11/02/16 2101 (!) 132/57     Pulse Rate 11/02/16 2101 84     Resp 11/02/16 2101 (!) 22     Temp 11/02/16 2101 98.7 F (37.1 C)     Temp Source 11/02/16 2101 Oral     SpO2 11/02/16 2101 94 %     Weight 11/02/16 2102 106 lb (48.1 kg)     Height 11/02/16 2102 '5\' 2"'$  (1.575 m)     Head Circumference --      Peak Flow --      Pain Score --      Pain Loc --      Pain  Edu? --      Excl. in [place]? --     Constitutional: Alert and oriented. Well appearing and in no apparent distress. HEENT:      Head: Normocephalic and atraumatic.         Eyes: Conjunctivae are normal. Sclera is non-icteric. EOMI. PERRL      Mouth/Throat: Mucous membranes are dry.       Neck: Supple with no signs of meningismus. Cardiovascular: Regular rate and rhythm. No murmurs, gallops, or rubs. 2+ symmetrical distal pulses are present in all extremities. No JVD. Respiratory: Normal respiratory effort. Lungs are clear to auscultation bilaterally. Decrease in R base. No wheezes, crackles, or rhonchi. Sating 90% on RA Gastrointestinal: Soft, non tender, and non distended with positive bowel sounds. No rebound or guarding. Genitourinary: No CVA tenderness. Musculoskeletal: Asymmetric lower extremities with pitting edema on the LL and normal exam on RL Neurologic: Mild Expressive aphasia. Face is symmetric. Moving all extremities. Normal strength and sensation Skin: Skin is warm, dry and intact. No rash noted. Psychiatric: Mood and affect are normal. Speech and behavior are normal.  ____________________________________________   LABS (all labs ordered are listed, but only abnormal results are  displayed)  Labs Reviewed  URINALYSIS, COMPLETE (UACMP) WITH MICROSCOPIC - Abnormal; Notable for the following:       Result Value   Color, Urine YELLOW (*)    APPearance CLEAR (*)    Protein, ur 30 (*)    Bacteria, UA RARE (*)    Squamous Epithelial / LPF 0-5 (*)    All other components within normal limits  BASIC METABOLIC PANEL - Abnormal; Notable for the following:    Chloride 100 (*)    Glucose, Bld 127 (*)    Creatinine, Ser 1.19 (*)    GFR calc non Af Amer 43 (*)    GFR calc Af Amer 49 (*)    All other components within normal limits  CBC - Abnormal; Notable for the following:    RBC 3.49 (*)    MCV 102.6 (*)    MCH 35.1 (*)    RDW 15.6 (*)    Platelets 122 (*)    All other components within normal limits  TROPONIN I  INFLUENZA PANEL BY PCR (TYPE A & B, H1N1)   ____________________________________________  EKG  ED ECG REPORT I, Rudene Re, the attending physician, personally viewed and interpreted this ECG.  10PM - Atrial paced ventricular sensed rhythm, rate 68, LAD, no STE or depressions  11:58 PM - Failure to capture pacing, rate 65, prolonged QTc, LAD, no STE ____________________________________________  RADIOLOGY  Venous Doppler: Negative for DVT  CXR: No acute findings   Head CT: PND ____________________________________________   PROCEDURES  Procedure(s) performed: None Procedures Critical Care performed:  None ____________________________________________   INITIAL IMPRESSION / ASSESSMENT AND PLAN / ED COURSE  80 y.o. female with a history of lung cancer in remission, COPD on 2L [place] at bedtime, celiac disease, atrial fibrillation on Eliuiqs and dementia who presents for malaise, fever, chills, and worsening confusion since this afternoon. Patient underwent colonoscopy yesterday. Patient here looks dry on exam, she is satting 90% on room air which improves on 2 L, her lungs are clear with decreased breath sounds on the right base however  patient is status post right lower lobe lumpectomy many years ago, no wheezing or crackles, abdomen is soft and nontender. We'll check a chest x-ray to rule out pneumonia, check a flu swab, check basic labs to rule out  electrolyte abnormalities, acute kidney injury. We'll also check a urinalysis to rule out a UTI. Patient was found to have asymmetric lower extremity edema with pitting edema on the left and normal on the right. We'll send patient for ultrasound to rule out a DVT.  Clinical Course    Doppler negative for DVT. Labs WNL, CXR with no acute findings. UA and head CT pending. Second EKG showing failure of pacer capture, 3rd EKG ordered and care transferred to Dr. Owens Shark  Pertinent labs & imaging results that were available during my care of the patient were reviewed by me and considered in my medical decision making (see chart for details).    ____________________________________________   FINAL CLINICAL IMPRESSION(S) / ED DIAGNOSES  Final diagnoses:  Confusion      NEW MEDICATIONS STARTED DURING THIS VISIT:  Discharge Medication List as of 11/03/2016  2:38 AM       Note:  This document was prepared using Dragon voice recognition software and may include unintentional dictation errors.    Rudene Re, MD 11/03/16 1038

## 2016-11-02 NOTE — ED Notes (Signed)
Pt. Returned to tx. room in stable condition with no acute changes since departure from unit for scans.   

## 2016-11-02 NOTE — ED Triage Notes (Signed)
Per acems: pt had colonoscopy yesterday, "not been feeling well since", pt. States poss UTI d/t burning with urination and cloudy urine.

## 2016-11-03 ENCOUNTER — Emergency Department: Payer: Medicare Other

## 2016-11-03 DIAGNOSIS — R410 Disorientation, unspecified: Secondary | ICD-10-CM | POA: Diagnosis not present

## 2016-11-03 LAB — URINALYSIS, COMPLETE (UACMP) WITH MICROSCOPIC
Bilirubin Urine: NEGATIVE
GLUCOSE, UA: NEGATIVE mg/dL
HGB URINE DIPSTICK: NEGATIVE
KETONES UR: NEGATIVE mg/dL
LEUKOCYTES UA: NEGATIVE
NITRITE: NEGATIVE
PH: 5 (ref 5.0–8.0)
PROTEIN: 30 mg/dL — AB
Specific Gravity, Urine: 1.016 (ref 1.005–1.030)

## 2016-11-03 NOTE — ED Notes (Signed)
Pt. Transported to CT at this time.  

## 2016-11-03 NOTE — ED Notes (Signed)
Patient is resting comfortably at this time with no signs of distress present. VS stable. Will continue to monitor.   

## 2016-11-03 NOTE — ED Notes (Signed)
MD at bedside. 

## 2016-11-03 NOTE — ED Notes (Signed)
Pt. Verbalizes understanding of d/c instructions and follow-up. VS stable and pain controlled per pt.  Pt. In NAD at time of d/c and denies further concerns regarding this visit. Pt. Stable at the time of departure from the unit, departing unit by the safest and most appropriate manner per that pt condition and limitations. Pt advised to return to the ED at any time for emergent concerns, or for new/worsening symptoms.   

## 2016-11-03 NOTE — ED Provider Notes (Signed)
I assumed care of the patient at 11:00 PM from Dr. Alfred Levins CT scan of the head revealed No acute pathology  CLINICAL DATA:  80 y/o  F; expressive a aphasia.  EXAM: CT HEAD WITHOUT CONTRAST  TECHNIQUE: Contiguous axial images were obtained from the base of the skull through the vertex without intravenous contrast.  COMPARISON:  None.  FINDINGS: Brain: No evidence of large acute infarct, focal mass effect, or intracranial hemorrhage. No hydrocephalus. Scattered foci of hypoattenuation in subcortical and periventricular white matter are compatible with mild chronic microvascular ischemic changes. Mild parenchymal volume loss. Small lucency in the right anterior limb of internal capsule it is probably age indeterminate lacunar infarct. Chronic lacunar infarct within the left lentiform nucleus.  Vascular: No hyperdense vessel. Calcific atherosclerosis of the carotid siphons.  Skull: Normal. Negative for fracture or focal lesion.  Sinuses/Orbits: No acute finding.  Other: None.  IMPRESSION: 1. No evidence of large acute infarct, focal mass effect, or intracranial hemorrhage. 2. Age-indeterminate probable lacunar infarct in right anterior limb of internal capsule. 3. Mild chronic microvascular ischemic changes and parenchymal volume loss of the brain.   Electronically Signed   By: Kristine Garbe M.D.   On: 11/03/2016 01:08   Laboratory data: Labs Reviewed  URINALYSIS, COMPLETE (UACMP) WITH MICROSCOPIC - Abnormal; Notable for the following:       Result Value   Color, Urine YELLOW (*)    APPearance CLEAR (*)    Protein, ur 30 (*)    Bacteria, UA RARE (*)    Squamous Epithelial / LPF 0-5 (*)    All other components within normal limits  BASIC METABOLIC PANEL - Abnormal; Notable for the following:    Chloride 100 (*)    Glucose, Bld 127 (*)    Creatinine, Ser 1.19 (*)    GFR calc non Af Amer 43 (*)    GFR calc Af Amer 49 (*)    All other  components within normal limits  CBC - Abnormal; Notable for the following:    RBC 3.49 (*)    MCV 102.6 (*)    MCH 35.1 (*)    RDW 15.6 (*)    Platelets 122 (*)    All other components within normal limits  TROPONIN I  INFLUENZA PANEL BY PCR (TYPE A & B, H1N1)   Patient is currently at baseline per husband. Patient denies any confusion at this time. Unclear etiology for the patient's brief episode of confusion      Gregor Hams, MD 11/03/16 [phone]

## 2016-11-05 ENCOUNTER — Encounter: Payer: Self-pay | Admitting: Emergency Medicine

## 2016-11-05 ENCOUNTER — Emergency Department: Payer: Medicare Other

## 2016-11-05 ENCOUNTER — Observation Stay
Admission: EM | Admit: 2016-11-05 | Discharge: 2016-11-06 | Disposition: A | Discharge status: Home / Self Care | Payer: Medicare Other | Attending: Internal Medicine | Admitting: Internal Medicine

## 2016-11-05 DIAGNOSIS — I495 Sick sinus syndrome: Secondary | ICD-10-CM | POA: Diagnosis not present

## 2016-11-05 DIAGNOSIS — Z950 Presence of cardiac pacemaker: Secondary | ICD-10-CM | POA: Insufficient documentation

## 2016-11-05 DIAGNOSIS — E538 Deficiency of other specified B group vitamins: Secondary | ICD-10-CM | POA: Insufficient documentation

## 2016-11-05 DIAGNOSIS — Z79899 Other long term (current) drug therapy: Secondary | ICD-10-CM | POA: Diagnosis not present

## 2016-11-05 DIAGNOSIS — Z792 Long term (current) use of antibiotics: Secondary | ICD-10-CM | POA: Insufficient documentation

## 2016-11-05 DIAGNOSIS — F0390 Unspecified dementia without behavioral disturbance: Secondary | ICD-10-CM | POA: Diagnosis present

## 2016-11-05 DIAGNOSIS — I10 Essential (primary) hypertension: Secondary | ICD-10-CM | POA: Diagnosis present

## 2016-11-05 DIAGNOSIS — I110 Hypertensive heart disease with heart failure: Secondary | ICD-10-CM | POA: Insufficient documentation

## 2016-11-05 DIAGNOSIS — Z86711 Personal history of pulmonary embolism: Secondary | ICD-10-CM | POA: Insufficient documentation

## 2016-11-05 DIAGNOSIS — I248 Other forms of acute ischemic heart disease: Principal | ICD-10-CM | POA: Insufficient documentation

## 2016-11-05 DIAGNOSIS — R778 Other specified abnormalities of plasma proteins: Secondary | ICD-10-CM | POA: Diagnosis present

## 2016-11-05 DIAGNOSIS — E860 Dehydration: Secondary | ICD-10-CM | POA: Diagnosis not present

## 2016-11-05 DIAGNOSIS — I5032 Chronic diastolic (congestive) heart failure: Secondary | ICD-10-CM | POA: Diagnosis not present

## 2016-11-05 DIAGNOSIS — J449 Chronic obstructive pulmonary disease, unspecified: Secondary | ICD-10-CM | POA: Diagnosis present

## 2016-11-05 DIAGNOSIS — G609 Hereditary and idiopathic neuropathy, unspecified: Secondary | ICD-10-CM | POA: Diagnosis not present

## 2016-11-05 DIAGNOSIS — Z7901 Long term (current) use of anticoagulants: Secondary | ICD-10-CM | POA: Diagnosis not present

## 2016-11-05 DIAGNOSIS — I4891 Unspecified atrial fibrillation: Secondary | ICD-10-CM | POA: Diagnosis present

## 2016-11-05 DIAGNOSIS — R531 Weakness: Secondary | ICD-10-CM

## 2016-11-05 DIAGNOSIS — R0602 Shortness of breath: Secondary | ICD-10-CM | POA: Diagnosis present

## 2016-11-05 DIAGNOSIS — I272 Pulmonary hypertension, unspecified: Secondary | ICD-10-CM | POA: Diagnosis not present

## 2016-11-05 DIAGNOSIS — Z85118 Personal history of other malignant neoplasm of bronchus and lung: Secondary | ICD-10-CM | POA: Insufficient documentation

## 2016-11-05 DIAGNOSIS — Z87891 Personal history of nicotine dependence: Secondary | ICD-10-CM | POA: Diagnosis not present

## 2016-11-05 DIAGNOSIS — R7989 Other specified abnormal findings of blood chemistry: Secondary | ICD-10-CM | POA: Diagnosis present

## 2016-11-05 DIAGNOSIS — R748 Abnormal levels of other serum enzymes: Secondary | ICD-10-CM | POA: Diagnosis not present

## 2016-11-05 DIAGNOSIS — K900 Celiac disease: Secondary | ICD-10-CM | POA: Diagnosis present

## 2016-11-05 HISTORY — DX: Heart failure, unspecified: I50.9

## 2016-11-05 HISTORY — DX: Sick sinus syndrome: I49.5

## 2016-11-05 LAB — BASIC METABOLIC PANEL
Anion gap: 9 (ref 5–15)
BUN: 23 mg/dL — AB (ref 6–20)
CALCIUM: 9 mg/dL (ref 8.9–10.3)
CO2: 27 mmol/L (ref 22–32)
CREATININE: 1.21 mg/dL — AB (ref 0.44–1.00)
Chloride: 93 mmol/L — ABNORMAL LOW (ref 101–111)
GFR calc Af Amer: 48 mL/min — ABNORMAL LOW (ref >60)
GFR, EST NON AFRICAN AMERICAN: 42 mL/min — AB (ref >60)
GLUCOSE: 133 mg/dL — AB (ref 65–99)
Potassium: 4 mmol/L (ref 3.5–5.1)
Sodium: 129 mmol/L — ABNORMAL LOW (ref 135–145)

## 2016-11-05 LAB — TROPONIN I
TROPONIN I: 0.08 ng/mL — AB (ref <0.03)
Troponin I: 0.05 ng/mL (ref <0.03)
Troponin I: 0.03 ng/mL (ref <0.03)

## 2016-11-05 LAB — CBC
HCT: 37.8 % (ref 35.0–47.0)
Hemoglobin: 12.8 g/dL (ref 12.0–16.0)
MCH: 35.2 pg — ABNORMAL HIGH (ref 26.0–34.0)
MCHC: 33.9 g/dL (ref 32.0–36.0)
MCV: 104 fL — ABNORMAL HIGH (ref 80.0–100.0)
PLATELETS: 141 10*3/uL — AB (ref 150–440)
RBC: 3.64 MIL/uL — ABNORMAL LOW (ref 3.80–5.20)
RDW: 16.6 % — AB (ref 11.5–14.5)
WBC: 8.7 10*3/uL (ref 3.6–11.0)

## 2016-11-05 MED ORDER — APIXABAN 5 MG PO TABS
5.0000 mg | ORAL_TABLET | Freq: Two times a day (BID) | ORAL | Status: DC
  Administered 2016-11-05 – 2016-11-06 (×2): 5 mg via ORAL
  Filled 2016-11-05 (×2): qty 1

## 2016-11-05 MED ORDER — ACETAMINOPHEN 325 MG PO TABS
650.0000 mg | ORAL_TABLET | Freq: Four times a day (QID) | ORAL | Status: DC | PRN
  Administered 2016-11-06: 650 mg via ORAL
  Filled 2016-11-05: qty 2

## 2016-11-05 MED ORDER — ONDANSETRON HCL 4 MG PO TABS: 4.0000 mg | ORAL_TABLET | Freq: Four times a day (QID) | ORAL | Status: DC | PRN

## 2016-11-05 MED ORDER — TIOTROPIUM BROMIDE MONOHYDRATE 18 MCG IN CAPS
18.0000 ug | ORAL_CAPSULE | Freq: Every day | RESPIRATORY_TRACT | Status: DC
  Administered 2016-11-06: 18 ug via RESPIRATORY_TRACT
  Filled 2016-11-05 (×2): qty 5

## 2016-11-05 MED ORDER — DONEPEZIL HCL 5 MG PO TABS
10.0000 mg | ORAL_TABLET | Freq: Every day | ORAL | Status: DC
  Administered 2016-11-05: 10 mg via ORAL
  Filled 2016-11-05: qty 2

## 2016-11-05 MED ORDER — MEMANTINE HCL 5 MG PO TABS
10.0000 mg | ORAL_TABLET | Freq: Every day | ORAL | Status: DC
  Administered 2016-11-05: 10 mg via ORAL
  Filled 2016-11-05 (×2): qty 2

## 2016-11-05 MED ORDER — PANTOPRAZOLE SODIUM 40 MG PO TBEC
40.0000 mg | DELAYED_RELEASE_TABLET | Freq: Every day | ORAL | Status: DC
  Administered 2016-11-06: 40 mg via ORAL
  Filled 2016-11-05: qty 1

## 2016-11-05 MED ORDER — ACETAMINOPHEN 650 MG RE SUPP: 650.0000 mg | Freq: Four times a day (QID) | RECTAL | Status: DC | PRN

## 2016-11-05 MED ORDER — ONDANSETRON HCL 4 MG/2ML IJ SOLN: 4.0000 mg | Freq: Four times a day (QID) | INTRAMUSCULAR | Status: DC | PRN

## 2016-11-05 MED ORDER — ALBUTEROL SULFATE (2.5 MG/3ML) 0.083% IN NEBU: 2.5000 mg | INHALATION_SOLUTION | Freq: Four times a day (QID) | RESPIRATORY_TRACT | Status: DC | PRN

## 2016-11-05 MED ORDER — SODIUM CHLORIDE 0.9 % IV BOLUS (SEPSIS)
1000.0000 mL | Freq: Once | INTRAVENOUS | Status: AC
  Administered 2016-11-05: 1000 mL via INTRAVENOUS

## 2016-11-05 MED ORDER — LEVALBUTEROL TARTRATE 45 MCG/ACT IN AERO: 2.0000 | INHALATION_SPRAY | RESPIRATORY_TRACT | Status: DC | PRN

## 2016-11-05 MED ORDER — FUROSEMIDE 20 MG PO TABS
20.0000 mg | ORAL_TABLET | Freq: Every day | ORAL | Status: DC
  Administered 2016-11-06: 20 mg via ORAL
  Filled 2016-11-05: qty 1

## 2016-11-05 MED ORDER — ALPRAZOLAM 0.25 MG PO TABS: 0.2500 mg | ORAL_TABLET | Freq: Every day | ORAL | Status: DC | PRN

## 2016-11-05 MED ORDER — GABAPENTIN 400 MG PO CAPS
1200.0000 mg | ORAL_CAPSULE | Freq: Two times a day (BID) | ORAL | Status: DC
  Administered 2016-11-05 – 2016-11-06 (×2): 1200 mg via ORAL
  Filled 2016-11-05 (×2): qty 3

## 2016-11-05 MED ORDER — SODIUM CHLORIDE 0.9% FLUSH
3.0000 mL | Freq: Two times a day (BID) | INTRAVENOUS | Status: DC
  Administered 2016-11-05 – 2016-11-06 (×2): 3 mL via INTRAVENOUS

## 2016-11-05 NOTE — ED Provider Notes (Signed)
[facility] Emergency Department Provider Note  Time seen: 6:22 PM  I have reviewed the triage vital signs and the nursing notes.   HISTORY  Chief Complaint Shortness of Breath and Altered Mental Status    HPI Cheryl Cannon is a 80 y.o. female with a past medical history of atrial fibrillation, pacemaker, COPD, dementia, presents the emergency department for evaluation. According to the husband the patient has a history of dementia and confusion. She had a colonoscopy performed 4 days ago. The husband states since the colonoscopy he has noted increased confusion Tuesday evening, brought her to the emergency department at that time with a largely normal workup. States she had a good day yesterday and most of today but once again noted some confusion. They saw their doctor who was concerned and wanted her evaluated in the emergency department. Here the patient denies any issues currently. States she does not feel confused, husband states she is at her baseline. Patient denies any trouble breathing, she has a history of 2 lobes and surgically removed from the right chest, wears oxygen as needed. Currently 92% on room air which the patient states is normal for her. Denies any cough, congestion or fever. She does state intermittent chills over the past several days. Denies any dysuria.  Past Medical History:  Diagnosis Date  . A-fib ([place])   . Cancer ([place])   . Celiac disease   . COPD (chronic obstructive pulmonary disease) ([place])   . Lung cancer ([place])   . Memory deficit     Patient Active Problem List   Diagnosis Date Noted  . UTI (lower urinary tract infection) 07/20/2016  . Acute on chronic diastolic heart failure ([place]) 01/06/2016  . Pulmonary embolism ([place]) 11/20/2015  . Pneumonia 11/15/2015  . CAP (community acquired pneumonia) 11/12/2015  . Mild chronic obstructive pulmonary disease ([place]) 10/14/2015  . B12 deficiency 10/14/2015  . Pulmonary hypertension  09/25/2015  . Benign essential HTN 09/04/2015  . MI (mitral incompetence) 09/04/2015  . TI (tricuspid incompetence) 09/04/2015  . Can't get food down 08/13/2015  . Insomnia, persistent 07/14/2015  . Mild dementia 07/14/2015  . H/O gastrointestinal disease 06/09/2015  . Hereditary and idiopathic peripheral neuropathy 05/12/2015  . D (diarrhea) 11/25/2014  . Chronic obstructive pulmonary disease ([place]) 10/20/2014  . Sensory neuropathy ([place]) 10/20/2014  . Neuropathy ([place]) 08/16/2014  . Dementia 08/16/2014  . Lung cancer ([place]) 08/16/2014  . Celiac disease 08/16/2014  . Atrial fibrillation ([place]) 08/16/2014  . Breath shortness 06/13/2012  . Encounter for adjustment and management of other part of cardiac pacemaker 05/10/2012  . Non-small cell carcinoma of lung ([place]) 09/08/2011  . Artificial cardiac pacemaker 09/08/2011  . Malignant pleural effusion 08/20/2010  . Cancer of lung ([place]) 04/24/2010    Past Surgical History:  Procedure Laterality Date  . LUNG CANCER SURGERY    . LUNG LOBECTOMY Right   . PACEMAKER INSERTION    . PACEMAKER PLACEMENT      Prior to Admission medications   Medication Sig Start Date End Date Taking? Authorizing Provider  albuterol (PROVENTIL HFA;VENTOLIN HFA) 108 (90 Base) MCG/ACT inhaler Inhale 2 puffs into the lungs every 6 (six) hours as needed for wheezing or shortness of breath. 07/17/16   Orbie Pyo, MD  ALPRAZolam Duanne Moron) 0.25 MG tablet Take 0.25 mg by mouth daily as needed for anxiety.     Historical Provider, MD  apixaban (ELIQUIS) 5 MG TABS tablet Take 1 tablet (5 mg total) by mouth 2 (two)  times daily. 11/20/15   Aldean Jewett, MD  cefUROXime (CEFTIN) 250 MG tablet Take 1 tablet (250 mg total) by mouth 2 (two) times daily with a meal. 07/21/16   Henreitta Leber, MD  cholecalciferol (VITAMIN D) 1000 UNITS tablet Take 2,000 Units by mouth daily.     Historical Provider, MD  cyanocobalamin (,VITAMIN B-12,) 1000 MCG/ML injection Inject  1,000 mcg into the muscle every 30 (thirty) days. Injection once a month    Historical Provider, MD  docusate sodium (COLACE) 100 MG capsule Take 300 mg by mouth daily.     Historical Provider, MD  donepezil (ARICEPT) 10 MG tablet Take 1 tablet (10 mg total) by mouth at bedtime. 09/14/16   Cameron Sprang, MD  flecainide (TAMBOCOR) 50 MG tablet Take 1.5 mg by mouth 2 (two) times daily.    Historical Provider, MD  furosemide (LASIX) 20 MG tablet Take 1 tablet (20 mg total) by mouth daily. 11/17/15   Aldean Jewett, MD  gabapentin (NEURONTIN) 300 MG capsule Take 4 capsules (1,200 mg total) by mouth 2 (two) times daily. 09/14/16   Cameron Sprang, MD  levalbuterol [facility] HFA) 45 MCG/ACT inhaler Inhale 2 puffs into the lungs every 4 (four) hours as needed for wheezing.    Historical Provider, MD  memantine (NAMENDA) 10 MG tablet Take 1 tablet daily for 1 week, then increase to 1 tablet twice a day 09/14/16   Cameron Sprang, MD  metoprolol tartrate (LOPRESSOR) 25 MG tablet Take 12.5 mg by mouth at bedtime.     Historical Provider, MD  pantoprazole (PROTONIX) 40 MG tablet Take 40 mg by mouth daily.     Historical Provider, MD  predniSONE (DELTASONE) 10 MG tablet Label  & dispense according to the schedule below. 4 Pills PO for 1 day, 3 Pills PO for 1 day, 2 Pills PO for 1 day, 1 Pill PO for 1 days then STOP. 07/21/16   Henreitta Leber, MD  tiotropium (SPIRIVA) 18 MCG inhalation capsule Place 18 mcg into inhaler and inhale daily.     Historical Provider, MD    Allergies  Allergen Reactions  . Gluten Meal Nausea And Vomiting  . Gluten Meal Other (See Comments)    GI UPSET  . Parabens Rash    Family History  Problem Relation Age of Onset  . Cancer Mother   . Cancer Sister     Social History Social History  Substance Use Topics  . Smoking status: Former Smoker    Packs/day: 1.00    Years: 44.00    Quit date: 11/22/1998  . Smokeless tobacco: Never Used  . Alcohol use Yes     Comment: social     Review of Systems Constitutional: Negative for fever. Cardiovascular: Negative for chest pain. Respiratory: Negative for shortness of breath. Gastrointestinal: Negative for abdominal pain Genitourinary: Negative for dysuria. Neurological: Negative for headaches, focal weakness or numbness. 10-point ROS otherwise negative.  ____________________________________________   PHYSICAL EXAM:  VITAL SIGNS: ED Triage Vitals  Enc Vitals Group     BP 11/05/16 1610 (!) 124/35     Pulse Rate 11/05/16 1610 79     Resp 11/05/16 1610 20     Temp 11/05/16 1610 98.3 F (36.8 C)     Temp Source 11/05/16 1610 Oral     SpO2 11/05/16 1610 98 %     Weight 11/05/16 1611 106 lb (48.1 kg)     Height 11/05/16 1611 '5\' 2"'$  (1.575 m)  Head Circumference --      Peak Flow --      Pain Score --      Pain Loc --      Pain Edu? --      Excl. in [place]? --     Constitutional: Alert and oriented. Well appearing and in no distress. Eyes: Normal exam ENT   Head: Normocephalic and atraumatic.   Mouth/Throat: Mucous membranes are moist. Cardiovascular: Normal rate, regular rhythm. No murmur Respiratory: Normal respiratory effort without tachypnea nor retractions. Breath sounds are clear Gastrointestinal: Soft and nontender. No distention.   Musculoskeletal: Nontender with normal range of motion in all extremities.  Neurologic:  Normal speech and language. No gross focal neurologic deficits Skin:  Skin is warm, dry and intact.  Psychiatric: Mood and affect are normal.   ____________________________________________    EKG  EKG reviewed and interpreted by myself shows what appears to be a paced rhythm, atrial paced rhythm at 62 bpm, widened QRS with normal axis, nonspecific ST changes. No obvious elevations.  ____________________________________________    RADIOLOGY  Chest x-ray is stable.  ____________________________________________   INITIAL IMPRESSION / ASSESSMENT AND PLAN / ED  COURSE  Pertinent labs & imaging results that were available during my care of the patient were reviewed by me and considered in my medical decision making (see chart for details).  Patient presents the emergency department for medical evaluation. States since her colonoscopy 4 days ago she has been feeling somewhat weak, intermittent confusion although the husband states this is fairly normal for her given her history of dementia. Here the patient denies any complaints at this time. Denies shortness of breath. Denies chest pain. Denies fever at home. Patient's workup was significant for a troponin of 0.05 which was normal 2 days ago. Sodium slightly low at baseline at 129. Patient denies any chest pain. We will IV hydrate, repeat a troponin and closely monitor.  Patient's troponin has elevated to 0.08. The troponin was normal 2 days ago when checks. Given the patient's intermittent shortness of breath with weakness and elevating troponin patient will be admitted to the hospital for close monitoring. Patient and husband agreeable.  ____________________________________________   FINAL CLINICAL IMPRESSION(S) / ED DIAGNOSES  Weakness    Harvest Dark, MD 11/05/16 2005

## 2016-11-05 NOTE — ED Triage Notes (Signed)
Pt comes into the ED via POV where her doctor sent her over due to shortness of breath, desating, and altered mental status after her recent colonoscopy.  Patient presents short of breath and having to wear 2 L nasal cannula.  Patient normally only wears O2 at night.  Patient A&Ox4 at this present time.  Patient presents shaking/trembling but denies any fevers at home.

## 2016-11-05 NOTE — H&P (Signed)
[facility] NAME: Cheryl Cannon    MR#:  [id number]   DATE OF BIRTH:  08-16-1936  DATE OF ADMISSION:  11/05/2016  PRIMARY CARE PHYSICIAN: Glendon Axe, MD   REQUESTING/REFERRING PHYSICIAN: Kerman Passey, MD  CHIEF COMPLAINT:   Chief Complaint  Patient presents with  . Shortness of Breath  . Altered Mental Status    HISTORY OF PRESENT ILLNESS:  Cheryl Cannon  is a 80 y.o. female who presents with Several days waxing and waning episodes of confusion with significant malaise and intermittent episodes of shortness of breath. Patient states this all began after her colonoscopy. She saw her primary care physician for follow-up after colonoscopy this afternoon, and was sent in here to the ED for further evaluation when she described her symptoms. Here in the ED her troponin is mildly elevated, and EKG seems to indicate possible pacemaker dysfunction. Hospitalists were called for admission and further evaluation.  PAST MEDICAL HISTORY:   Past Medical History:  Diagnosis Date  . A-fib ([place])   . Cancer ([place])   . Celiac disease   . CHF (congestive heart failure) ([place])   . COPD (chronic obstructive pulmonary disease) ([place])   . Lung cancer ([place])   . Memory deficit   . SSS (sick sinus syndrome) ([place])     PAST SURGICAL HISTORY:   Past Surgical History:  Procedure Laterality Date  . LUNG CANCER SURGERY    . LUNG LOBECTOMY Right   . PACEMAKER INSERTION    . PACEMAKER PLACEMENT      SOCIAL HISTORY:   Social History  Substance Use Topics  . Smoking status: Former Smoker    Packs/day: 1.00    Years: 44.00    Quit date: 11/22/1998  . Smokeless tobacco: Never Used  . Alcohol use Yes     Comment: social    FAMILY HISTORY:   Family History  Problem Relation Age of Onset  . Cancer Mother   . Cancer Sister     DRUG ALLERGIES:   Allergies  Allergen Reactions  . Gluten Meal Nausea And Vomiting  . Gluten Meal Other (See  Comments)    GI UPSET  . Parabens Rash    MEDICATIONS AT HOME:   Prior to Admission medications   Medication Sig Start Date End Date Taking? Authorizing Provider  albuterol (PROVENTIL HFA;VENTOLIN HFA) 108 (90 Base) MCG/ACT inhaler Inhale 2 puffs into the lungs every 6 (six) hours as needed for wheezing or shortness of breath. 07/17/16  Yes Orbie Pyo, MD  ALPRAZolam Duanne Moron) 0.25 MG tablet Take 0.25 mg by mouth daily as needed for anxiety.    Yes Historical Provider, MD  apixaban (ELIQUIS) 5 MG TABS tablet Take 1 tablet (5 mg total) by mouth 2 (two) times daily. 11/20/15  Yes Aldean Jewett, MD  cholecalciferol (VITAMIN D) 1000 UNITS tablet Take 2,000 Units by mouth daily.    Yes Historical Provider, MD  cyanocobalamin (,VITAMIN B-12,) 1000 MCG/ML injection Inject 1,000 mcg into the muscle every 30 (thirty) days. Injection once a month   Yes Historical Provider, MD  docusate sodium (COLACE) 100 MG capsule Take 100 mg by mouth daily.    Yes Historical Provider, MD  donepezil (ARICEPT) 10 MG tablet Take 1 tablet (10 mg total) by mouth at bedtime. 09/14/16  Yes Cameron Sprang, MD  flecainide (TAMBOCOR) 50 MG tablet Take 1.5 mg by mouth 2 (two) times daily.   Yes Historical Provider, MD  furosemide (  LASIX) 20 MG tablet Take 1 tablet (20 mg total) by mouth daily. 11/17/15  Yes Aldean Jewett, MD  gabapentin (NEURONTIN) 300 MG capsule Take 4 capsules (1,200 mg total) by mouth 2 (two) times daily. 09/14/16  Yes Cameron Sprang, MD  levalbuterol [facility] HFA) 45 MCG/ACT inhaler Inhale 2 puffs into the lungs every 4 (four) hours as needed for wheezing.   Yes Historical Provider, MD  memantine (NAMENDA) 10 MG tablet Take 1 tablet daily for 1 week, then increase to 1 tablet twice a day Patient taking differently: Take 10 mg by mouth daily. Take 1 tablet daily for 1 week, then increase to 1 tablet twice a day 09/14/16  Yes Cameron Sprang, MD  metoprolol tartrate (LOPRESSOR) 25 MG tablet  Take 12.5 mg by mouth at bedtime.    Yes Historical Provider, MD  pantoprazole (PROTONIX) 40 MG tablet Take 40 mg by mouth daily.    Yes Historical Provider, MD  tiotropium (SPIRIVA) 18 MCG inhalation capsule Place 18 mcg into inhaler and inhale daily.    Yes Historical Provider, MD  cefUROXime (CEFTIN) 250 MG tablet Take 1 tablet (250 mg total) by mouth 2 (two) times daily with a meal. Patient not taking: Reported on 11/05/2016 07/21/16   Henreitta Leber, MD  predniSONE (DELTASONE) 10 MG tablet Label  & dispense according to the schedule below. 4 Pills PO for 1 day, 3 Pills PO for 1 day, 2 Pills PO for 1 day, 1 Pill PO for 1 days then STOP. Patient not taking: Reported on 11/05/2016 07/21/16   Henreitta Leber, MD    REVIEW OF SYSTEMS:  Review of Systems  Constitutional: Positive for malaise/fatigue. Negative for chills, fever and weight loss.  HENT: Negative for ear pain, hearing loss and tinnitus.   Eyes: Negative for blurred vision, double vision, pain and redness.  Respiratory: Positive for shortness of breath. Negative for cough and hemoptysis.   Cardiovascular: Negative for chest pain, palpitations, orthopnea and leg swelling.  Gastrointestinal: Negative for abdominal pain, constipation, diarrhea, nausea and vomiting.  Genitourinary: Negative for dysuria, frequency and hematuria.  Musculoskeletal: Negative for back pain, joint pain and neck pain.  Skin:       No acne, rash, or lesions  Neurological: Positive for weakness. Negative for dizziness, tremors and focal weakness.  Endo/Heme/Allergies: Negative for polydipsia. Does not bruise/bleed easily.  Psychiatric/Behavioral: Negative for depression. The patient is not nervous/anxious and does not have insomnia.      VITAL SIGNS:   Vitals:   11/05/16 1615 11/05/16 1830 11/05/16 1900 11/05/16 2000  BP:  96/85 (!) 105/51 (!) 108/55  Pulse:  (!) 57 61 60  Resp:  '14 16 16  '$ Temp:      TempSrc:      SpO2: (!) 88% 92%  97%  Weight:       Height:       Wt Readings from Last 3 Encounters:  11/05/16 48.1 kg (106 lb)  11/02/16 48.1 kg (106 lb)  09/14/16 50 kg (110 lb 2 oz)    PHYSICAL EXAMINATION:  Physical Exam  Vitals reviewed. Constitutional: She is oriented to person, place, and time. She appears well-developed and well-nourished. No distress.  HENT:  Head: Normocephalic and atraumatic.  Mouth/Throat: Oropharynx is clear and moist.  Eyes: Conjunctivae and EOM are normal. Pupils are equal, round, and reactive to light. No scleral icterus.  Neck: Normal range of motion. Neck supple. No JVD present. No thyromegaly present.  Cardiovascular: Normal rate,  regular rhythm and intact distal pulses.  Exam reveals no gallop and no friction rub.   Murmur (2/6 pansystolic murmur) heard. Respiratory: Effort normal and breath sounds normal. No respiratory distress. She has no wheezes. She has no rales.  GI: Soft. Bowel sounds are normal. She exhibits no distension. There is no tenderness.  Musculoskeletal: Normal range of motion. She exhibits no edema.  No arthritis, no gout  Lymphadenopathy:    She has no cervical adenopathy.  Neurological: She is alert and oriented to person, place, and time. No cranial nerve deficit.  No dysarthria, no aphasia  Skin: Skin is warm and dry. No rash noted. No erythema.  Psychiatric: She has a normal mood and affect. Her behavior is normal. Judgment and thought content normal.    LABORATORY PANEL:   CBC  Recent Labs Lab 11/05/16 1612  WBC 8.7  HGB 12.8  HCT 37.8  PLT 141*   ------------------------------------------------------------------------------------------------------------------  Chemistries   Recent Labs Lab 11/05/16 1612  NA 129*  K 4.0  CL 93*  CO2 27  GLUCOSE 133*  BUN 23*  CREATININE 1.21*  CALCIUM 9.0   ------------------------------------------------------------------------------------------------------------------  Cardiac Enzymes  Recent Labs Lab  11/05/16 1906  TROPONINI 0.08*   ------------------------------------------------------------------------------------------------------------------  RADIOLOGY:  Dg Chest 2 View  Result Date: 11/05/2016 CLINICAL DATA:  Shortness of breath. EXAM: CHEST  2 VIEW COMPARISON:  11/02/2016.  Chest CT dated 07/17/2016. FINDINGS: The cardiac silhouette remains borderline enlarged. Pleural density and patchy opacity on the right have not changed significantly. Right basilar surgical staples. The left lung remains clear. Stable left subclavian pacemaker leads. Mild thoracic spine degenerative changes. Right mid abdominal surgical clips. IMPRESSION: 1. Stable postsurgical changes on the right. 2. No acute abnormality Electronically Signed   By: Claudie Revering M.D.   On: 11/05/2016 16:52    EKG:   Orders placed or performed during the hospital encounter of 11/05/16  . ED EKG  . ED EKG    IMPRESSION AND PLAN:  Principal Problem:   Elevated troponin - unclear etiology at this time. Patient denies any episodes of chest pain. She has had these intermittent bouts of acute onset shortness of breath which self resolve. We will trend her troponin tonight get a cardiology consult in the morning. Active Problems:   Atrial fibrillation (HCC) - EKG seems indicated that she currently is in a junctional rhythm. She does have a history of pacemaker insertion, and it is unclear to me if perhaps she had an AV nodal ablation at that time. However, her pacemaker does not seem to be firing appropriately, or the very least she does not seem to be capturing very well as her EKG shows pacer spike mismatch to her QRS complexes. Cardiology consult as above. Continue anticoagulation at home dose.   Benign essential HTN - continue home meds, except for those which might further lower her heart rate.   Chronic obstructive pulmonary disease (HCC) - continue home inhalers   SSS (sick sinus syndrome) ([place]) - likely needs pacemaker  interrogation, cardiology consult as above   Dementia - continue home meds   Celiac disease - gluten-free diet  All the records are reviewed and case discussed with ED provider. Management plans discussed with the patient and/or family.  DVT PROPHYLAXIS: Systemic anticoagulation  GI PROPHYLAXIS: PPI  ADMISSION STATUS: Observation  CODE STATUS: Full Code Status History    Date Active Date Inactive Code Status Order ID Comments User Context   07/20/2016  7:33 PM 07/21/2016  4:40  PM Full Code [id number]  Hillary Bow, MD ED   11/15/2015  9:41 AM 11/17/2015  6:55 PM Full Code [id number]  Saundra Shelling, MD Inpatient   11/12/2015  5:46 AM 11/13/2015  8:12 PM Full Code [id number]  Harrie Foreman, MD ED    Advance Directive Documentation   Flowsheet Row Most Recent Value  Type of Advance Directive  Healthcare Power of Attorney, Living will  Pre-existing out of facility DNR order (yellow form or pink MOST form)  No data  "MOST" Form in Place?  No data      TOTAL TIME TAKING CARE OF THIS PATIENT: 40 minutes.    Leovanni Bjorkman [place] 11/05/2016, 8:33 PM  [facility]  Office  [phone]  CC: Primary care physician; Glendon Axe, MD

## 2016-11-06 DIAGNOSIS — I248 Other forms of acute ischemic heart disease: Secondary | ICD-10-CM | POA: Diagnosis not present

## 2016-11-06 LAB — CBC
HEMATOCRIT: 30.1 % — AB (ref 35.0–47.0)
Hemoglobin: 10.3 g/dL — ABNORMAL LOW (ref 12.0–16.0)
MCH: 34.9 pg — ABNORMAL HIGH (ref 26.0–34.0)
MCHC: 34.2 g/dL (ref 32.0–36.0)
MCV: 102.1 fL — ABNORMAL HIGH (ref 80.0–100.0)
PLATELETS: 102 10*3/uL — AB (ref 150–440)
RBC: 2.94 MIL/uL — ABNORMAL LOW (ref 3.80–5.20)
RDW: 16.1 % — AB (ref 11.5–14.5)
WBC: 5.1 10*3/uL (ref 3.6–11.0)

## 2016-11-06 LAB — BASIC METABOLIC PANEL
Anion gap: 7 (ref 5–15)
BUN: 19 mg/dL (ref 6–20)
CO2: 25 mmol/L (ref 22–32)
Calcium: 8.5 mg/dL — ABNORMAL LOW (ref 8.9–10.3)
Chloride: 100 mmol/L — ABNORMAL LOW (ref 101–111)
Creatinine, Ser: 0.79 mg/dL (ref 0.44–1.00)
Glucose, Bld: 113 mg/dL — ABNORMAL HIGH (ref 65–99)
POTASSIUM: 3.8 mmol/L (ref 3.5–5.1)
SODIUM: 132 mmol/L — AB (ref 135–145)

## 2016-11-06 LAB — TROPONIN I
TROPONIN I: 0.03 ng/mL — AB (ref <0.03)
Troponin I: 0.04 ng/mL (ref <0.03)

## 2016-11-06 MED ORDER — DOCUSATE SODIUM 100 MG PO CAPS
100.0000 mg | ORAL_CAPSULE | Freq: Every day | ORAL | Status: DC
  Administered 2016-11-06: 100 mg via ORAL
  Filled 2016-11-06: qty 1

## 2016-11-06 MED ORDER — FLECAINIDE ACETATE 50 MG PO TABS
75.0000 mg | ORAL_TABLET | Freq: Two times a day (BID) | ORAL | Status: DC
  Administered 2016-11-06: 75 mg via ORAL
  Filled 2016-11-06 (×3): qty 2

## 2016-11-06 MED ORDER — SIMETHICONE 80 MG PO CHEW
80.0000 mg | CHEWABLE_TABLET | Freq: Three times a day (TID) | ORAL | Status: DC
  Administered 2016-11-06: 80 mg via ORAL
  Filled 2016-11-06 (×3): qty 1

## 2016-11-06 NOTE — Progress Notes (Signed)
Pt arrived from ED alert and oriented to person placeand time. Husband at bedside. Pt sometimes disoriented to situation. Telemetry box verified with Thayer Headings NT . Skin verified with Vincente Liberty RN , no issues noted. No c/o pain, no SOB, pt on 2L O2. VS stable, no concerns offered.

## 2016-11-06 NOTE — Progress Notes (Addendum)
Cheryl Cannon at [facility]                                                                                                                                                                                  Patient Demographics   Cheryl Cannon, is a 80 y.o. female, DOB - 09/15/1936, ZYS:[id number]  Admit date - 11/05/2016   Admitting Physician Cheryl Coon, MD  Outpatient Primary MD for the patient is Singh,Jasmine, MD   LOS - 0  Subjective: Patient admited with sob and intermittent confusion, she is currently doing better, no cp or sob     Review of Systems:   CONSTITUTIONAL: No documented fever. No fatigue, weakness. No weight gain, no weight loss.  EYES: No blurry or double vision.  ENT: No tinnitus. No postnasal drip. No redness of the oropharynx.  RESPIRATORY: No cough, no wheeze, no hemoptysis. No dyspnea.  CARDIOVASCULAR: No chest pain. No orthopnea. No palpitations. No syncope.  GASTROINTESTINAL: No nausea, no vomiting or diarrhea. No abdominal pain. No melena or hematochezia.  GENITOURINARY: No dysuria or hematuria.  ENDOCRINE: No polyuria or nocturia. No heat or cold intolerance.  HEMATOLOGY: No anemia. No bruising. No bleeding.  INTEGUMENTARY: No rashes. No lesions.  MUSCULOSKELETAL: No arthritis. No swelling. No gout.  NEUROLOGIC: No numbness, tingling, or ataxia. No seizure-type activity.  PSYCHIATRIC: No anxiety. No insomnia. No ADD.    Vitals:   Vitals:   11/06/16 0521 11/06/16 0922 11/06/16 0926 11/06/16 1131  BP: (!) 108/58 (!) 114/55  (!) 109/42  Pulse: 64 67 60 63  Resp: 18 (!) 24  19  Temp: 97.5 F (36.4 C)   97.5 F (36.4 C)  TempSrc: Oral   Oral  SpO2: 98% 97% 97% 93%  Weight:      Height:        Wt Readings from Last 3 Encounters:  11/06/16 109 lb 14.4 oz (49.9 kg)  11/02/16 106 lb (48.1 kg)  09/14/16 110 lb 2 oz (50 kg)     Intake/Output Summary (Last 24 hours) at 11/06/16 1316 Last data filed at 11/06/16 1131  Gross  per 24 hour  Intake             1243 ml  Output               25 ml  Net             1218 ml    Physical Exam:   GENERAL: Pleasant-appearing in no apparent distress.  HEAD, EYES, EARS, NOSE AND THROAT: Atraumatic, normocephalic. Extraocular muscles are intact. Pupils equal and reactive to light. Sclerae anicteric. No conjunctival injection. No oro-pharyngeal erythema.  NECK: Supple. There is no jugular venous distention. No bruits, no lymphadenopathy, no thyromegaly.  HEART: Regular rate and rhythm,. No murmurs, no rubs, no clicks.  LUNGS: Clear to auscultation bilaterally. No rales or rhonchi. No wheezes.  ABDOMEN: Soft, flat, nontender, nondistended. Has good bowel sounds. No hepatosplenomegaly appreciated.  EXTREMITIES: No evidence of any cyanosis, clubbing, or peripheral edema.  +2 pedal and radial pulses bilaterally.  NEUROLOGIC: The patient is alert, awake, and oriented x3 with no focal motor or sensory deficits appreciated bilaterally.  SKIN: Moist and warm with no rashes appreciated.  Psych: Not anxious, depressed LN: No inguinal LN enlargement    Antibiotics   Anti-infectives    None      Medications   Scheduled Meds: . apixaban  5 mg Oral BID  . docusate sodium  100 mg Oral Daily  . donepezil  10 mg Oral QHS  . flecainide  75 mg Oral BID  . furosemide  20 mg Oral Daily  . gabapentin  1,200 mg Oral BID  . memantine  10 mg Oral Daily  . pantoprazole  40 mg Oral Daily  . simethicone  80 mg Oral TID  . sodium chloride flush  3 mL Intravenous Q12H  . tiotropium  18 mcg Inhalation Daily   Continuous Infusions: PRN Meds:.acetaminophen **OR** acetaminophen, albuterol, ALPRAZolam, ondansetron **OR** ondansetron (ZOFRAN) IV   Data Review:   Micro Results No results found for this or any previous visit (from the past 240 hour(s)).  Radiology Reports Dg Chest 2 View  Result Date: 11/05/2016 CLINICAL DATA:  Shortness of breath. EXAM: CHEST  2 VIEW COMPARISON:   11/02/2016.  Chest CT dated 07/17/2016. FINDINGS: The cardiac silhouette remains borderline enlarged. Pleural density and patchy opacity on the right have not changed significantly. Right basilar surgical staples. The left lung remains clear. Stable left subclavian pacemaker leads. Mild thoracic spine degenerative changes. Right mid abdominal surgical clips. IMPRESSION: 1. Stable postsurgical changes on the right. 2. No acute abnormality Electronically Signed   By: Claudie Revering M.D.   On: 11/05/2016 16:52   Dg Chest 2 View  Result Date: 11/02/2016 CLINICAL DATA:  80 y/o F; shortness of breath. History of lung cancer status post right lower and middle lobe resection. EXAM: CHEST  2 VIEW COMPARISON:  07/20/2016 chest radiograph FINDINGS: Status post right middle and lower lobectomy with stable pleural thickening/effusion and rightward mediastinal shift from volume loss. Clear left lung. Stable enlarged cardiac silhouette. Surgical sutures are present in the right mid lung zone. Moderate levocurvature of the lumbar spine. Two lead pacemaker. IMPRESSION: Stable cardiac silhouette and postsurgical changes of right lung. Clear left lung. Electronically Signed   By: Kristine Garbe M.D.   On: 11/02/2016 22:34   Ct Head Wo Contrast  Result Date: 11/03/2016 CLINICAL DATA:  80 y/o  F; expressive a aphasia. EXAM: CT HEAD WITHOUT CONTRAST TECHNIQUE: Contiguous axial images were obtained from the base of the skull through the vertex without intravenous contrast. COMPARISON:  None. FINDINGS: Brain: No evidence of large acute infarct, focal mass effect, or intracranial hemorrhage. No hydrocephalus. Scattered foci of hypoattenuation in subcortical and periventricular white matter are compatible with mild chronic microvascular ischemic changes. Mild parenchymal volume loss. Small lucency in the right anterior limb of internal capsule it is probably age indeterminate lacunar infarct. Chronic lacunar infarct within  the left lentiform nucleus. Vascular: No hyperdense vessel. Calcific atherosclerosis of the carotid siphons. Skull: Normal. Negative for fracture or focal lesion. Sinuses/Orbits: No acute finding.  Other: None. IMPRESSION: 1. No evidence of large acute infarct, focal mass effect, or intracranial hemorrhage. 2. Age-indeterminate probable lacunar infarct in right anterior limb of internal capsule. 3. Mild chronic microvascular ischemic changes and parenchymal volume loss of the brain. Electronically Signed   By: Kristine Garbe M.D.   On: 11/03/2016 01:08   US Venous Img Lower Unilateral Left  Result Date: 11/02/2016 CLINICAL DATA:  Initial evaluation for asymmetric swelling, edema. EXAM: Left LOWER EXTREMITY VENOUS DOPPLER ULTRASOUND TECHNIQUE: Gray-scale sonography with graded compression, as well as color Doppler and duplex ultrasound were performed to evaluate the lower extremity deep venous systems from the level of the common femoral vein and including the common femoral, femoral, profunda femoral, popliteal and calf veins including the posterior tibial, peroneal and gastrocnemius veins when visible. The superficial great saphenous vein was also interrogated. Spectral Doppler was utilized to evaluate flow at rest and with distal augmentation maneuvers in the common femoral, femoral and popliteal veins. COMPARISON:  None available. FINDINGS: Contralateral Common Femoral Vein: Respiratory phasicity is normal and symmetric with the symptomatic side. No evidence of thrombus. Normal compressibility. Common Femoral Vein: No evidence of thrombus. Normal compressibility, respiratory phasicity and response to augmentation. Saphenofemoral Junction: No evidence of thrombus. Normal compressibility and flow on color Doppler imaging. Profunda Femoral Vein: No evidence of thrombus. Normal compressibility and flow on color Doppler imaging. Femoral Vein: No evidence of thrombus. Normal compressibility, respiratory  phasicity and response to augmentation. Popliteal Vein: No evidence of thrombus. Normal compressibility, respiratory phasicity and response to augmentation. Calf Veins: No evidence of thrombus. Normal compressibility and flow on color Doppler imaging. Superficial Great Saphenous Vein: No evidence of thrombus. Normal compressibility and flow on color Doppler imaging. Venous Reflux:  None. Other Findings: Incidental note made of a cyst within the left popliteal fossa measuring 3.6 x 0.9 x 2.8 cm. IMPRESSION: 1. No evidence of deep venous thrombosis. 2. 3.6 x 0.9 x 2.8 cm cysts within the left popliteal fossa, likely a Baker cyst. Electronically Signed   By: Jeannine Boga M.D.   On: 11/02/2016 23:27     CBC  Recent Labs Lab 11/02/16 2145 11/05/16 1612 11/06/16 0427  WBC 7.9 8.7 5.1  HGB 12.2 12.8 10.3*  HCT 35.8 37.8 30.1*  PLT 122* 141* 102*  MCV 102.6* 104.0* 102.1*  MCH 35.1* 35.2* 34.9*  MCHC 34.2 33.9 34.2  RDW 15.6* 16.6* 16.1*    Chemistries   Recent Labs Lab 11/02/16 2145 11/05/16 1612 11/06/16 0427  NA 135 129* 132*  K 3.9 4.0 3.8  CL 100* 93* 100*  CO2 '25 27 25  '$ GLUCOSE 127* 133* 113*  BUN 13 23* 19  CREATININE 1.19* 1.21* 0.79  CALCIUM 9.1 9.0 8.5*   ------------------------------------------------------------------------------------------------------------------ estimated creatinine clearance is 45.7 mL/min (by C-G formula based on SCr of 0.79 mg/dL). ------------------------------------------------------------------------------------------------------------------ No results for input(s): HGBA1C in the last 72 hours. ------------------------------------------------------------------------------------------------------------------ No results for input(s): CHOL, HDL, LDLCALC, TRIG, CHOLHDL, LDLDIRECT in the last 72 hours. ------------------------------------------------------------------------------------------------------------------ No results for input(s):  TSH, T4TOTAL, T3FREE, THYROIDAB in the last 72 hours.  Invalid input(s): FREET3 ------------------------------------------------------------------------------------------------------------------ No results for input(s): VITAMINB12, FOLATE, FERRITIN, TIBC, IRON, RETICCTPCT in the last 72 hours.  Coagulation profile No results for input(s): INR, PROTIME in the last 168 hours.  No results for input(s): DDIMER in the last 72 hours.  Cardiac Enzymes  Recent Labs Lab 11/05/16 2308 11/06/16 0427 11/06/16 0948  TROPONINI 0.03* 0.04* 0.03*   ------------------------------------------------------------------------------------------------------------------ Invalid input(s): POCBNP  Assessment & Plan    * Elevated troponin  Due to demand ischemia patient currently asymptomatic troponin is 0.04 no further workup needed  *  Atrial fibrillation ([place]) -  There was  concern with pacemaker capturing have her pacemaker interrogated Resume her flecainide [place] anticoagulated   * Benign essential HTN - continue home meds  * Chronic obstructive pulmonary disease ([place]) - continue home inhalers no evidence of exasperation *  Dementia - continue home meds patient has had some intermittent confusion * Celiac disease - gluten-free diet     Code Status Orders        Start     Ordered   11/05/16 2153  Full code  Continuous     11/05/16 2153    Code Status History    Date Active Date Inactive Code Status Order ID Comments User Context   07/20/2016  7:33 PM 07/21/2016  4:40 PM Full Code [id number]  Hillary Bow, MD ED   11/15/2015  9:41 AM 11/17/2015  6:55 PM Full Code [id number]  Saundra Shelling, MD Inpatient   11/12/2015  5:46 AM 11/13/2015  8:12 PM Full Code [id number]  Harrie Foreman, MD ED    Advance Directive Documentation   Flowsheet Row Most Recent Value  Type of Advance Directive  Healthcare Power of Attorney  Pre-existing out of facility DNR order (yellow form or pink MOST form)  No  data  "MOST" Form in Place?  No data           Consults Cardiology DVT Prophylaxis  Continue ELIQUIS  Lab Results  Component Value Date   PLT 102 (L) 11/06/2016     Time Spent in minutes  35 MIN Greater than 50% of time spent in care coordination and counseling patient regarding the condition and plan of care.   Dustin Flock M.D on 11/06/2016 at 1:16 PM  Between 7am to 6pm - Pager - [phone]  After 6pm go to [email or web address] - password [email or web address] [facility]   Office  [phone]

## 2016-11-06 NOTE — Consult Note (Signed)
Reason for Consult:AFIB AMS SOB Referring Physician: Dr Lavetta Nielsen hospitalist,   Cheryl Cannon is an 80 y.o. female.  HPI: Pt has a hx of multiple medical problem but was post op from colonoscopy and had AMS with weakness . EKG appear slightly abnoral with erratic pacer spikes. Pt denies palp but has a hx of AFIB SSS with PPM. She feel much better today . She has a hx of Celiacx disease which cause abdominal discomfort. NO CP RECENTLY AND HER WEAKESS HAS IMPROVED.No F/C/S no urinary symptoms no bleeding   Past Medical History:  Diagnosis Date  . A-fib ([place])   . Cancer ([place])   . Celiac disease   . CHF (congestive heart failure) ([place])   . COPD (chronic obstructive pulmonary disease) ([place])   . Lung cancer ([place])   . Memory deficit   . SSS (sick sinus syndrome) [facility])     Past Surgical History:  Procedure Laterality Date  . LUNG CANCER SURGERY    . LUNG LOBECTOMY Right   . PACEMAKER INSERTION    . PACEMAKER PLACEMENT      Family History  Problem Relation Age of Onset  . Cancer Mother   . Cancer Sister     Social History:  reports that she quit smoking about 17 years ago. She has a 44.00 pack-year smoking history. She has never used smokeless tobacco. She reports that she drinks alcohol. She reports that she does not use drugs.  Allergies:  Allergies  Allergen Reactions  . Gluten Meal Nausea And Vomiting  . Gluten Meal Other (See Comments)    GI UPSET  . Parabens Rash    Medications: I have reviewed the patient's current medications.  Results for orders placed or performed during the hospital encounter of 11/05/16 (from the past 48 hour(s))  Basic metabolic panel     Status: Abnormal   Collection Time: 11/05/16  4:12 PM  Result Value Ref Range   Sodium 129 (L) 135 - 145 mmol/L   Potassium 4.0 3.5 - 5.1 mmol/L   Chloride 93 (L) 101 - 111 mmol/L   CO2 27 22 - 32 mmol/L   Glucose, Bld 133 (H) 65 - 99 mg/dL   BUN 23 (H) 6 - 20 mg/dL   Creatinine, Ser 1.21 (H) 0.44 - 1.00 mg/dL    Calcium 9.0 8.9 - 10.3 mg/dL   GFR calc non Af Amer 42 (L) >60 mL/min   GFR calc Af Amer 48 (L) >60 mL/min    Comment: (NOTE) The eGFR has been calculated using the CKD EPI equation. This calculation has not been validated in all clinical situations. eGFR's persistently <60 mL/min signify possible Chronic Kidney Disease.    Anion gap 9 5 - 15  CBC     Status: Abnormal   Collection Time: 11/05/16  4:12 PM  Result Value Ref Range   WBC 8.7 3.6 - 11.0 K/uL   RBC 3.64 (L) 3.80 - 5.20 MIL/uL   Hemoglobin 12.8 12.0 - 16.0 g/dL   HCT 37.8 35.0 - 47.0 %   MCV 104.0 (H) 80.0 - 100.0 fL   MCH 35.2 (H) 26.0 - 34.0 pg   MCHC 33.9 32.0 - 36.0 g/dL   RDW 16.6 (H) 11.5 - 14.5 %   Platelets 141 (L) 150 - 440 K/uL  Troponin I     Status: Abnormal   Collection Time: 11/05/16  4:12 PM  Result Value Ref Range   Troponin I 0.05 (HH) <0.03 ng/mL    Comment:  CRITICAL RESULT CALLED TO, READ BACK BY AND VERIFIED WITH CALLED KASEY PIERCE AT 1649 ON 11/05/16 BY [facility]   Troponin I     Status: Abnormal   Collection Time: 11/05/16  7:06 PM  Result Value Ref Range   Troponin I 0.08 (HH) <0.03 ng/mL    Comment: CRITICAL VALUE NOTED. VALUE IS CONSISTENT WITH PREVIOUSLY REPORTED/CALLED VALUE [facility]  Troponin I     Status: Abnormal   Collection Time: 11/05/16 11:08 PM  Result Value Ref Range   Troponin I 0.03 (HH) <0.03 ng/mL    Comment: CRITICAL VALUE NOTED. VALUE IS CONSISTENT WITH PREVIOUSLY REPORTED/CALLED VALUE.PMH  Troponin I     Status: Abnormal   Collection Time: 11/06/16  4:27 AM  Result Value Ref Range   Troponin I 0.04 (HH) <0.03 ng/mL    Comment: CRITICAL VALUE NOTED. VALUE IS CONSISTENT WITH PREVIOUSLY REPORTED/CALLED VALUE.PMH  Basic metabolic panel     Status: Abnormal   Collection Time: 11/06/16  4:27 AM  Result Value Ref Range   Sodium 132 (L) 135 - 145 mmol/L   Potassium 3.8 3.5 - 5.1 mmol/L   Chloride 100 (L) 101 - 111 mmol/L   CO2 25 22 - 32 mmol/L   Glucose, Bld 113 (H) 65 - 99  mg/dL   BUN 19 6 - 20 mg/dL   Creatinine, Ser 0.79 0.44 - 1.00 mg/dL   Calcium 8.5 (L) 8.9 - 10.3 mg/dL   GFR calc non Af Amer >60 >60 mL/min   GFR calc Af Amer >60 >60 mL/min    Comment: (NOTE) The eGFR has been calculated using the CKD EPI equation. This calculation has not been validated in all clinical situations. eGFR's persistently <60 mL/min signify possible Chronic Kidney Disease.    Anion gap 7 5 - 15  CBC     Status: Abnormal   Collection Time: 11/06/16  4:27 AM  Result Value Ref Range   WBC 5.1 3.6 - 11.0 K/uL   RBC 2.94 (L) 3.80 - 5.20 MIL/uL   Hemoglobin 10.3 (L) 12.0 - 16.0 g/dL   HCT 30.1 (L) 35.0 - 47.0 %   MCV 102.1 (H) 80.0 - 100.0 fL   MCH 34.9 (H) 26.0 - 34.0 pg   MCHC 34.2 32.0 - 36.0 g/dL   RDW 16.1 (H) 11.5 - 14.5 %   Platelets 102 (L) 150 - 440 K/uL  Troponin I     Status: Abnormal   Collection Time: 11/06/16  9:48 AM  Result Value Ref Range   Troponin I 0.03 (HH) <0.03 ng/mL    Comment: CRITICAL VALUE NOTED. VALUE IS CONSISTENT WITH PREVIOUSLY REPORTED/CALLED VALUE. SGD    Dg Chest 2 View  Result Date: 11/05/2016 CLINICAL DATA:  Shortness of breath. EXAM: CHEST  2 VIEW COMPARISON:  11/02/2016.  Chest CT dated 07/17/2016. FINDINGS: The cardiac silhouette remains borderline enlarged. Pleural density and patchy opacity on the right have not changed significantly. Right basilar surgical staples. The left lung remains clear. Stable left subclavian pacemaker leads. Mild thoracic spine degenerative changes. Right mid abdominal surgical clips. IMPRESSION: 1. Stable postsurgical changes on the right. 2. No acute abnormality Electronically Signed   By: Claudie Revering M.D.   On: 11/05/2016 16:52    Review of Systems  Constitutional: Positive for malaise/fatigue.  HENT: Negative.   Eyes: Negative.   Respiratory: Positive for shortness of breath.   Cardiovascular: Positive for palpitations.  Gastrointestinal: Positive for abdominal pain and heartburn.   Genitourinary: Negative.   Musculoskeletal: Positive  for myalgias.  Skin: Negative.   Neurological: Positive for dizziness and speech change.  Endo/Heme/Allergies: Negative.   Psychiatric/Behavioral: Negative.    Blood pressure (!) 109/42, pulse 63, temperature 97.5 F (36.4 C), temperature source Oral, resp. rate 19, height 5' 2"  (1.575 m), weight 49.9 kg (109 lb 14.4 oz), SpO2 93 %. Physical Exam  Assessment/Plan: AMS Weakness Dehydration Abdominal Pain AFIB PHTN SSS PPM TR COPD Non small cell Lung Ca Dementia . PLAN Agree with ROMI Agree with tele and ekgs Continue inhalrs Gentle hydration Supplimental 02 Continue anticoug for AFIB Eliquis GERD therapy with protonix Pacer interrogation as In/Out pt? F/U with cardiology 1-2 weeks    Breshay Ilg D Cacie Gaskins 11/06/2016, 7:21 PM

## 2016-11-06 NOTE — Progress Notes (Signed)
Discharged to home with husband and children.  Will be seen as an outpatient to adjust the pacemaker.

## 2016-11-07 NOTE — Discharge Summary (Signed)
[place] at [facility], 80 y.o., DOB [birth year], MRN [id number]. Admission date: 11/05/2016 Discharge Date 11/07/2016 Primary MD Glendon Axe, MD Admitting Physician Lance Coon, MD  Admission Diagnosis  Weakness [R53.1]  Discharge Diagnosis   Principal Problem:   Elevated troponin due to demand ischemia   Dementia   Celiac disease   Atrial fibrillation (HCC)   Benign essential HTN   Chronic obstructive pulmonary disease (HCC)   SSS (sick sinus syndrome) ([place]) Will need a pacemaker check         Hospital Course  Cheryl Cannon  is a 80 y.o. female who presents with Several days waxing and waning episodes of confusion with significant malaise and intermittent episodes of shortness of breath. Patient states this all began after her colonoscopy. She saw her primary care physician for follow-up after colonoscopy this afternoon, and was sent in here to the ED for further evaluation when she described her symptoms. Here in the ED her troponin is mildly elevated, and EKG seems to indicate possible pacemaker dysfunction. Hospitalists were called for admission and further evaluation.Pt troponin was borderline and was seen by Dr. Clayborn Bigness who reviewed her telemetry and her heart attack enzyme patient did not have any chest pain he recommended outpatient follow-up. With no further intervention. Patient currently doing well and is stable for discharge. She will need her pacemaker check as outpatient.              Consults  cardiology  Significant Tests:  See full reports for all details     Dg Chest 2 View  Result Date: 11/05/2016 CLINICAL DATA:  Shortness of breath. EXAM: CHEST  2 VIEW COMPARISON:  11/02/2016.  Chest CT dated 07/17/2016. FINDINGS: The cardiac silhouette remains borderline enlarged. Pleural density and patchy opacity on the right have not changed significantly. Right basilar surgical staples. The left lung remains clear.  Stable left subclavian pacemaker leads. Mild thoracic spine degenerative changes. Right mid abdominal surgical clips. IMPRESSION: 1. Stable postsurgical changes on the right. 2. No acute abnormality Electronically Signed   By: Claudie Revering M.D.   On: 11/05/2016 16:52   Dg Chest 2 View  Result Date: 11/02/2016 CLINICAL DATA:  80 y/o F; shortness of breath. History of lung cancer status post right lower and middle lobe resection. EXAM: CHEST  2 VIEW COMPARISON:  07/20/2016 chest radiograph FINDINGS: Status post right middle and lower lobectomy with stable pleural thickening/effusion and rightward mediastinal shift from volume loss. Clear left lung. Stable enlarged cardiac silhouette. Surgical sutures are present in the right mid lung zone. Moderate levocurvature of the lumbar spine. Two lead pacemaker. IMPRESSION: Stable cardiac silhouette and postsurgical changes of right lung. Clear left lung. Electronically Signed   By: Kristine Garbe M.D.   On: 11/02/2016 22:34   Ct Head Wo Contrast  Result Date: 11/03/2016 CLINICAL DATA:  80 y/o  F; expressive a aphasia. EXAM: CT HEAD WITHOUT CONTRAST TECHNIQUE: Contiguous axial images were obtained from the base of the skull through the vertex without intravenous contrast. COMPARISON:  None. FINDINGS: Brain: No evidence of large acute infarct, focal mass effect, or intracranial hemorrhage. No hydrocephalus. Scattered foci of hypoattenuation in subcortical and periventricular white matter are compatible with mild chronic microvascular ischemic changes. Mild parenchymal volume loss. Small lucency in the right anterior limb of internal capsule it is probably age indeterminate lacunar infarct. Chronic lacunar infarct within the left lentiform nucleus. Vascular: No hyperdense vessel. Calcific atherosclerosis of the carotid siphons.  Skull: Normal. Negative for fracture or focal lesion. Sinuses/Orbits: No acute finding. Other: None. IMPRESSION: 1. No evidence of  large acute infarct, focal mass effect, or intracranial hemorrhage. 2. Age-indeterminate probable lacunar infarct in right anterior limb of internal capsule. 3. Mild chronic microvascular ischemic changes and parenchymal volume loss of the brain. Electronically Signed   By: Kristine Garbe M.D.   On: 11/03/2016 01:08   US Venous Img Lower Unilateral Left  Result Date: 11/02/2016 CLINICAL DATA:  Initial evaluation for asymmetric swelling, edema. EXAM: Left LOWER EXTREMITY VENOUS DOPPLER ULTRASOUND TECHNIQUE: Gray-scale sonography with graded compression, as well as color Doppler and duplex ultrasound were performed to evaluate the lower extremity deep venous systems from the level of the common femoral vein and including the common femoral, femoral, profunda femoral, popliteal and calf veins including the posterior tibial, peroneal and gastrocnemius veins when visible. The superficial great saphenous vein was also interrogated. Spectral Doppler was utilized to evaluate flow at rest and with distal augmentation maneuvers in the common femoral, femoral and popliteal veins. COMPARISON:  None available. FINDINGS: Contralateral Common Femoral Vein: Respiratory phasicity is normal and symmetric with the symptomatic side. No evidence of thrombus. Normal compressibility. Common Femoral Vein: No evidence of thrombus. Normal compressibility, respiratory phasicity and response to augmentation. Saphenofemoral Junction: No evidence of thrombus. Normal compressibility and flow on color Doppler imaging. Profunda Femoral Vein: No evidence of thrombus. Normal compressibility and flow on color Doppler imaging. Femoral Vein: No evidence of thrombus. Normal compressibility, respiratory phasicity and response to augmentation. Popliteal Vein: No evidence of thrombus. Normal compressibility, respiratory phasicity and response to augmentation. Calf Veins: No evidence of thrombus. Normal compressibility and flow on color  Doppler imaging. Superficial Great Saphenous Vein: No evidence of thrombus. Normal compressibility and flow on color Doppler imaging. Venous Reflux:  None. Other Findings: Incidental note made of a cyst within the left popliteal fossa measuring 3.6 x 0.9 x 2.8 cm. IMPRESSION: 1. No evidence of deep venous thrombosis. 2. 3.6 x 0.9 x 2.8 cm cysts within the left popliteal fossa, likely a Baker cyst. Electronically Signed   By: Jeannine Boga M.D.   On: 11/02/2016 23:27       Today   Subjective:   Cheryl Cannon  Pt feeling well back to baseline  Objective:   Blood pressure (!) 109/42, pulse 63, temperature 97.5 F (36.4 C), temperature source Oral, resp. rate 19, height '5\' 2"'$  (1.575 m), weight 109 lb 14.4 oz (49.9 kg), SpO2 93 %.  . No intake or output data in the 24 hours ending 11/07/16 1727  Exam VITAL SIGNS: Blood pressure (!) 109/42, pulse 63, temperature 97.5 F (36.4 C), temperature source Oral, resp. rate 19, height '5\' 2"'$  (1.575 m), weight 109 lb 14.4 oz (49.9 kg), SpO2 93 %.  GENERAL:  79 y.o.-year-old patient lying in the bed with no acute distress.  EYES: Pupils equal, round, reactive to light and accommodation. No scleral icterus. Extraocular muscles intact.  HEENT: Head atraumatic, normocephalic. Oropharynx and nasopharynx clear.  NECK:  Supple, no jugular venous distention. No thyroid enlargement, no tenderness.  LUNGS: Normal breath sounds bilaterally, no wheezing, rales,rhonchi or crepitation. No use of accessory muscles of respiration.  CARDIOVASCULAR: S1, S2 normal. No murmurs, rubs, or gallops.  ABDOMEN: Soft, nontender, nondistended. Bowel sounds present. No organomegaly or mass.  EXTREMITIES: No pedal edema, cyanosis, or clubbing.  NEUROLOGIC: Cranial nerves II through XII are intact. Muscle strength 5/5 in all extremities. Sensation intact. Gait not checked.  PSYCHIATRIC:  The patient is alert and oriented x 3.  SKIN: No obvious rash, lesion, or ulcer.   Data  Review     CBC w Diff:  Lab Results  Component Value Date   WBC 5.1 11/06/2016   HGB 10.3 (L) 11/06/2016   HCT 30.1 (L) 11/06/2016   PLT 102 (L) 11/06/2016   LYMPHOPCT 5 11/15/2015   MONOPCT 6 11/15/2015   EOSPCT 1 11/15/2015   BASOPCT 0 11/15/2015   CMP:  Lab Results  Component Value Date   NA 132 (L) 11/06/2016   K 3.8 11/06/2016   CL 100 (L) 11/06/2016   CO2 25 11/06/2016   BUN 19 11/06/2016   CREATININE 0.79 11/06/2016   PROT 7.7 07/20/2016   ALBUMIN 4.8 07/20/2016   BILITOT 2.0 (H) 07/20/2016   ALKPHOS 123 07/20/2016   AST 40 07/20/2016   ALT 19 07/20/2016  .  Micro Results No results found for this or any previous visit (from the past 240 hour(s)).   Code Status History    Date Active Date Inactive Code Status Order ID Comments User Context   11/05/2016  9:53 PM 11/06/2016  9:03 PM Full Code [id number]  Lance Coon, MD Inpatient   07/20/2016  7:33 PM 07/21/2016  4:40 PM Full Code [id number]  Hillary Bow, MD ED   11/15/2015  9:41 AM 11/17/2015  6:55 PM Full Code [id number]  Saundra Shelling, MD Inpatient   11/12/2015  5:46 AM 11/13/2015  8:12 PM Full Code [id number]  Harrie Foreman, MD ED    Advance Directive Documentation   Flowsheet Row Most Recent Value  Type of Advance Directive  Healthcare Power of Attorney  Pre-existing out of facility DNR order (yellow form or pink MOST form)  No data  "MOST" Form in Place?  No data          Follow-up Information    Corey Skains, MD Follow up in 5 day(s).   Specialty:  Cardiology Contact information: [facility] [place] [address] [phone]        Singh,Jasmine, MD Follow up in 1 week(s).   Specialty:  Internal Medicine Contact information: [place] [address] [phone]           Discharge Medications   Allergies as of 11/06/2016      Reactions   Gluten Meal Nausea And Vomiting   Gluten Meal  Other (See Comments)   GI UPSET   Parabens Rash      Medication List    STOP taking these medications   cefUROXime 250 MG tablet Commonly known as:  CEFTIN   predniSONE 10 MG tablet Commonly known as:  DELTASONE     TAKE these medications   albuterol 108 (90 Base) MCG/ACT inhaler Commonly known as:  PROVENTIL HFA;VENTOLIN HFA Inhale 2 puffs into the lungs every 6 (six) hours as needed for wheezing or shortness of breath.   ALPRAZolam 0.25 MG tablet Commonly known as:  XANAX Take 0.25 mg by mouth daily as needed for anxiety.   apixaban 5 MG Tabs tablet Commonly known as:  ELIQUIS Take 1 tablet (5 mg total) by mouth 2 (two) times daily.   cholecalciferol 1000 units tablet Commonly known as:  VITAMIN D Take 2,000 Units by mouth daily.   cyanocobalamin 1000 MCG/ML injection Commonly known as:  (VITAMIN B-12) Inject 1,000 mcg into the muscle every 30 (thirty) days. Injection once a month   docusate sodium 100  MG capsule Commonly known as:  COLACE Take 100 mg by mouth daily.   donepezil 10 MG tablet Commonly known as:  ARICEPT Take 1 tablet (10 mg total) by mouth at bedtime.   flecainide 50 MG tablet Commonly known as:  TAMBOCOR Take 75 mg by mouth 2 (two) times daily.   furosemide 20 MG tablet Commonly known as:  LASIX Take 1 tablet (20 mg total) by mouth daily.   gabapentin 300 MG capsule Commonly known as:  NEURONTIN Take 4 capsules (1,200 mg total) by mouth 2 (two) times daily.   memantine 10 MG tablet Commonly known as:  NAMENDA Take 1 tablet daily for 1 week, then increase to 1 tablet twice a day What changed:  how much to take  how to take this  when to take this  additional instructions   metoprolol tartrate 25 MG tablet Commonly known as:  LOPRESSOR Take 12.5 mg by mouth at bedtime.   pantoprazole 40 MG tablet Commonly known as:  PROTONIX Take 40 mg by mouth daily.   tiotropium 18 MCG inhalation capsule Commonly known as:   SPIRIVA Place 18 mcg into inhaler and inhale daily.   XOPENEX HFA 45 MCG/ACT inhaler Generic drug:  levalbuterol Inhale 2 puffs into the lungs every 4 (four) hours as needed for wheezing.          Total Time in preparing paper work, data evaluation and todays exam - 35 minutes  Dustin Flock M.D on 11/07/2016 at [place] PM  [place]  [phone]

## 2016-11-10 ENCOUNTER — Encounter: Payer: Self-pay | Admitting: *Deleted

## 2016-11-18 ENCOUNTER — Ambulatory Visit: Payer: Self-pay | Admitting: Neurology

## 2016-11-23 IMAGING — CR DG CHEST 2V
2 series · 2 of 2 positions shown · non-contrast
Comparison: None.

CLINICAL DATA: Acute onset of worsening shortness of breath.
Initial encounter.

EXAM:
CHEST  2 VIEW

[chest pa]
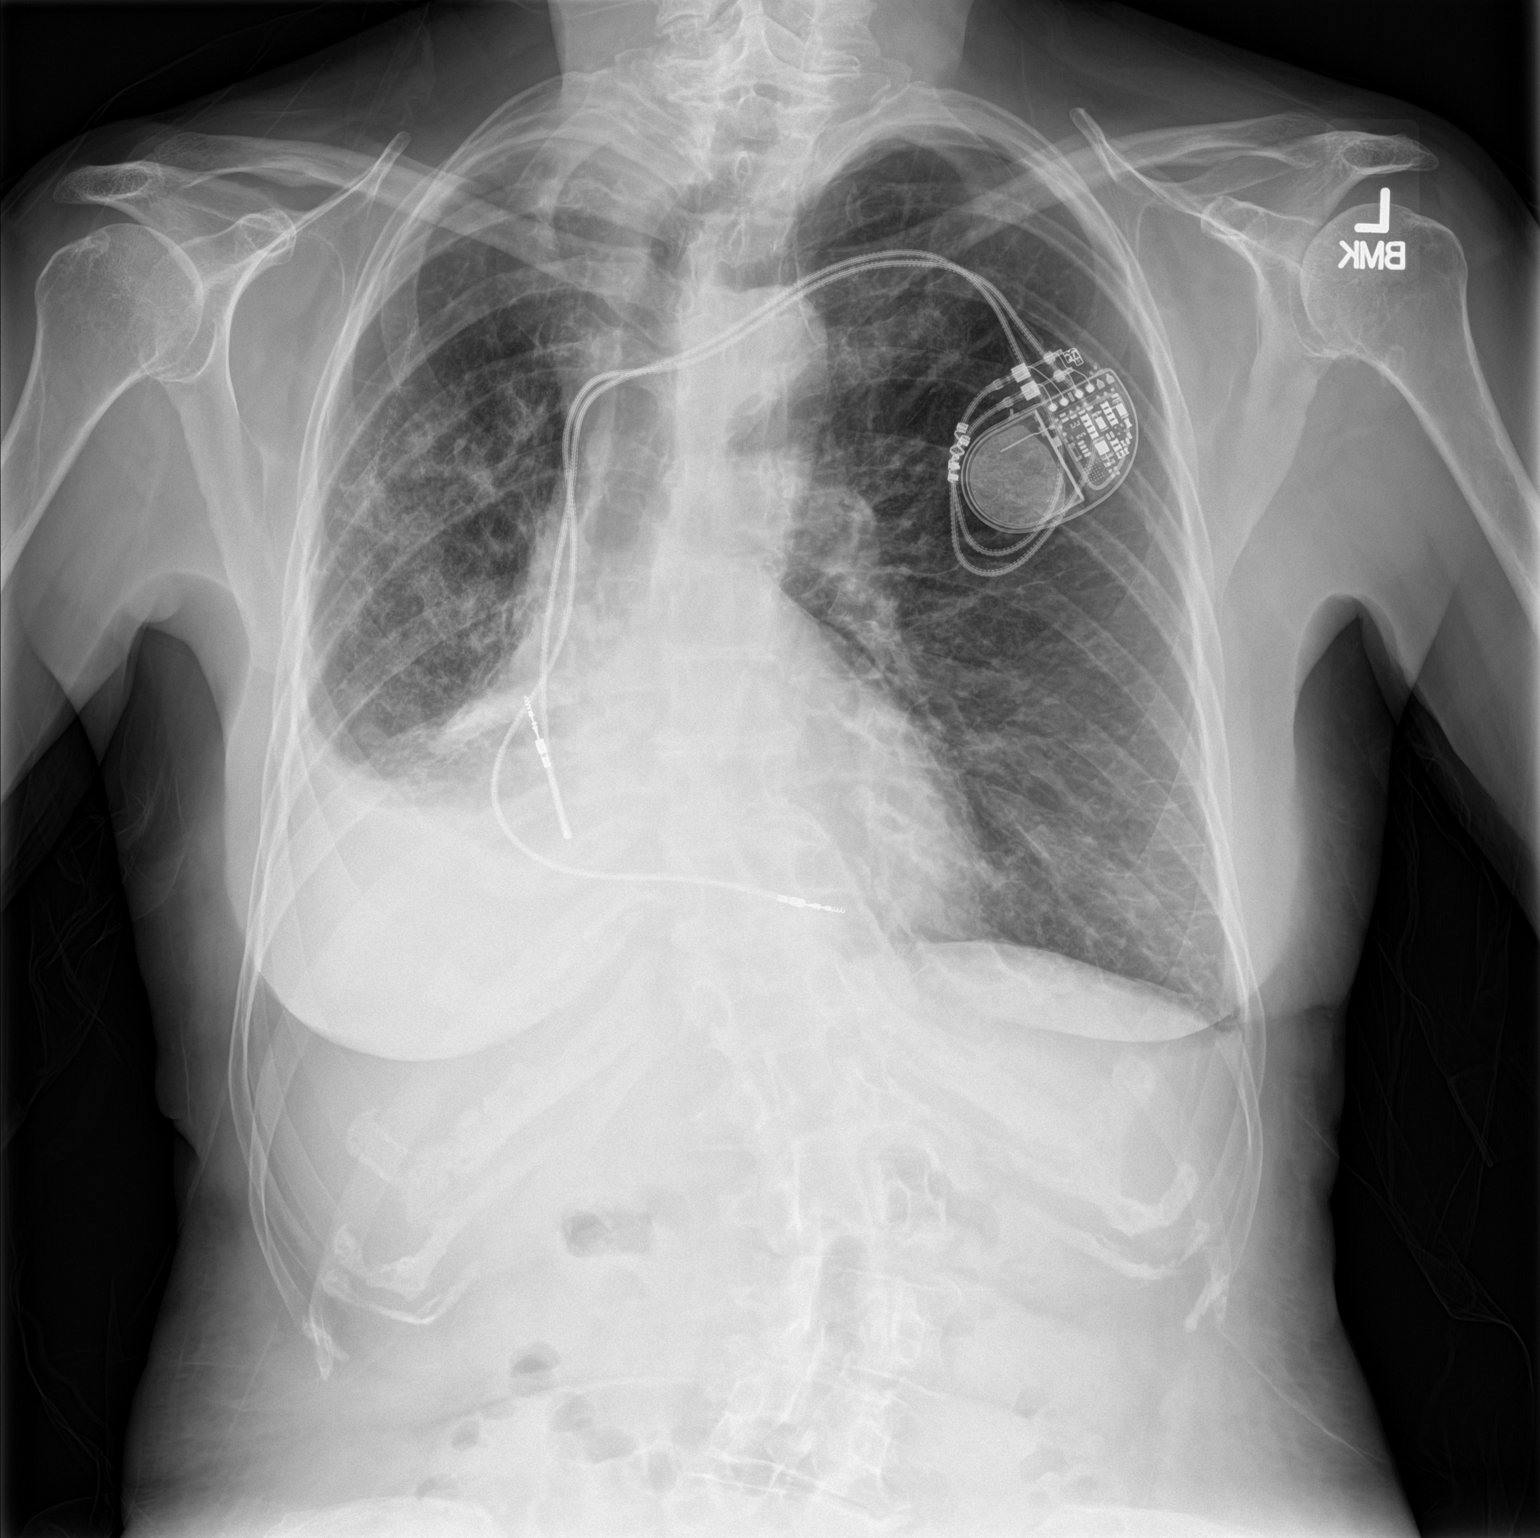

[chest lat]
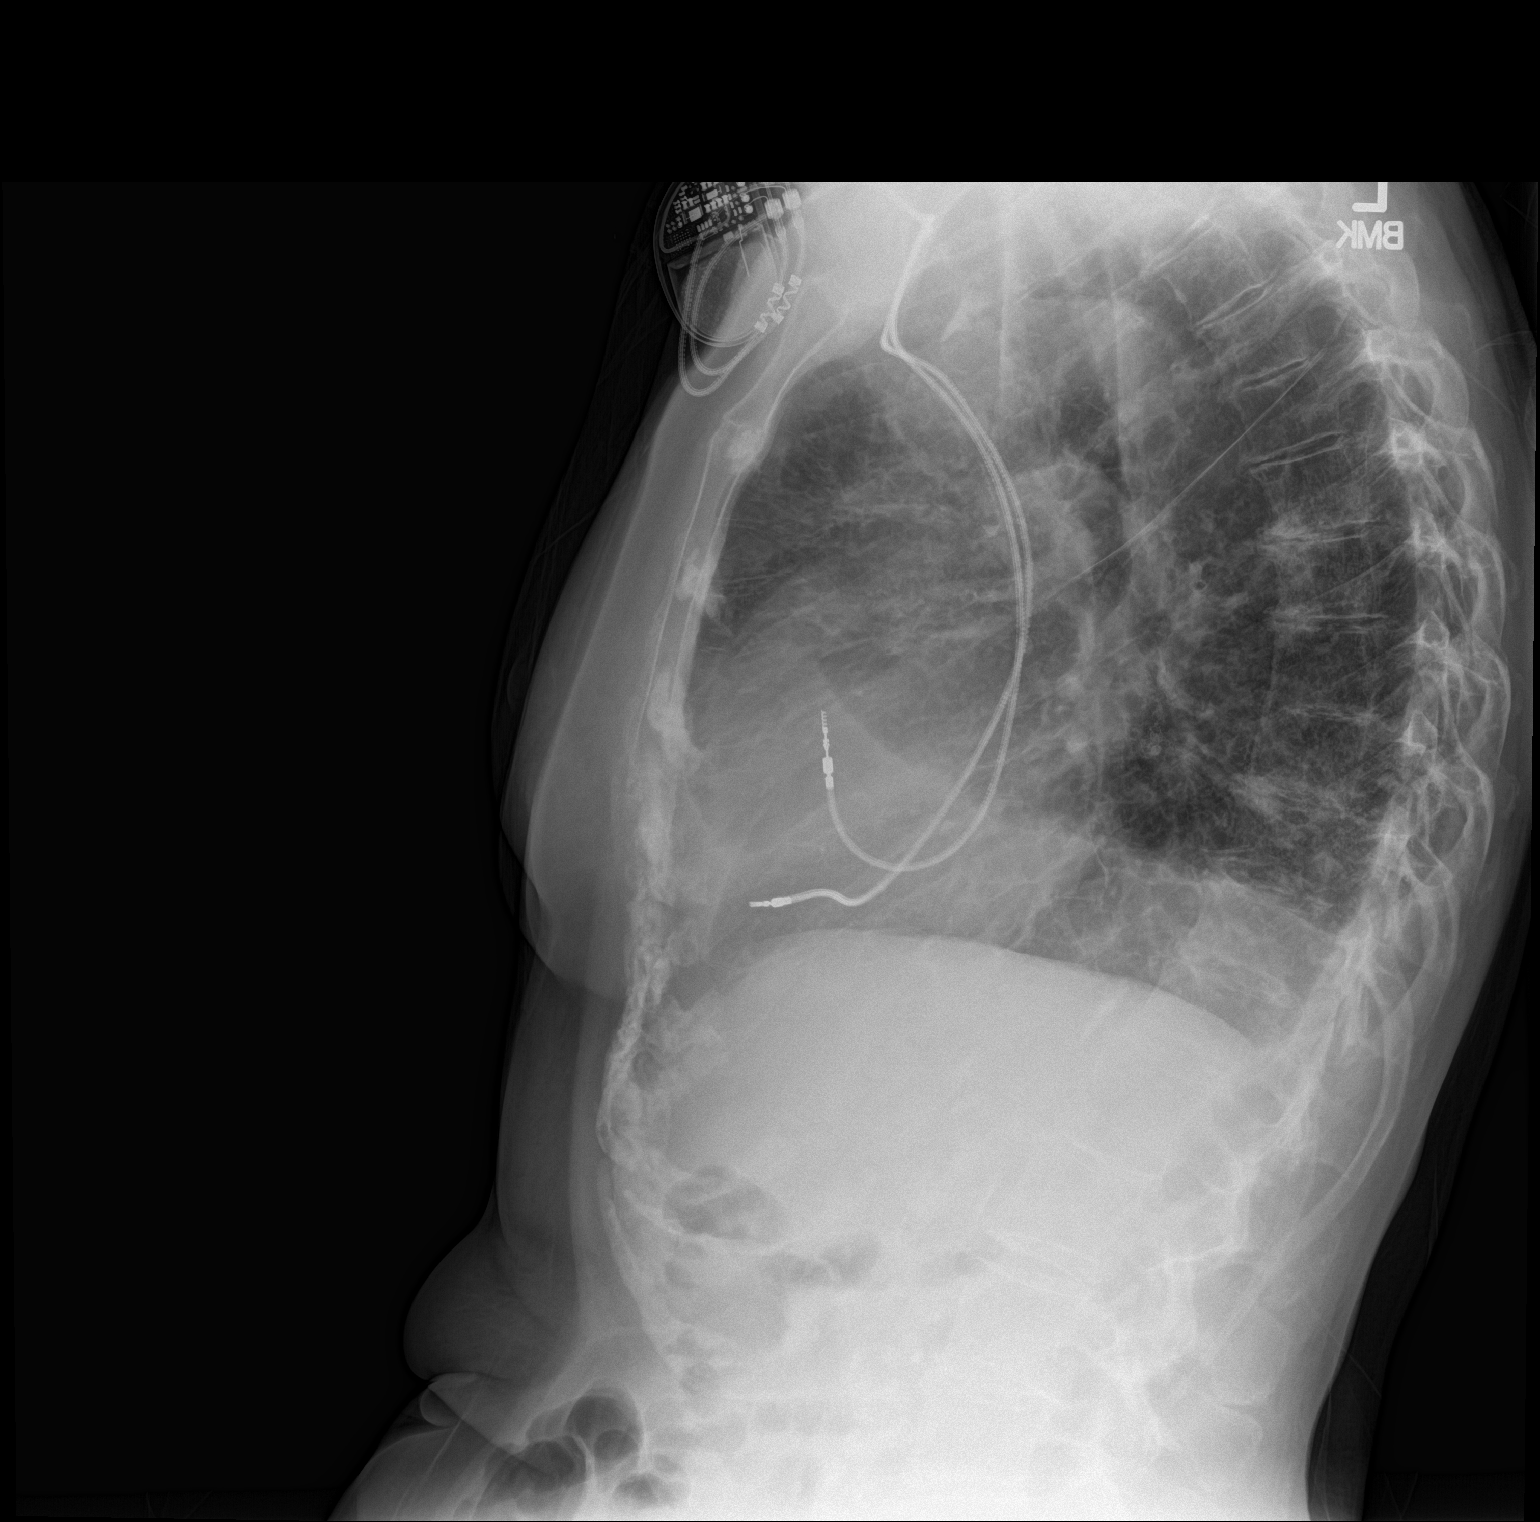

[2 of 2 positions shown; findings below may reference images not displayed]

FINDINGS: A small right pleural effusion is noted. Increased interstitial
markings may reflect mildly asymmetric interstitial edema or
possibly pneumonia. No pneumothorax is seen.

The cardiomediastinal silhouette is borderline normal in size. No
acute osseous abnormalities are identified. A pacemaker is noted at
the left chest wall, with leads ending at the right atrium and right
ventricle. Left convex lumbar scoliosis is noted.
IMPRESSION: Small right pleural effusion noted. Increased interstitial markings
may reflect mildly asymmetric interstitial edema or possibly
pneumonia.

## 2016-11-25 ENCOUNTER — Ambulatory Visit (INDEPENDENT_AMBULATORY_CARE_PROVIDER_SITE_OTHER): Payer: Medicare Other | Admitting: General Surgery

## 2016-11-25 ENCOUNTER — Encounter: Payer: Self-pay | Admitting: General Surgery

## 2016-11-25 VITALS — BP 110/70 | HR 68 | Resp 12 | Ht 60.0 in | Wt 106.0 lb

## 2016-11-25 DIAGNOSIS — C182 Malignant neoplasm of ascending colon: Secondary | ICD-10-CM

## 2016-11-25 MED ORDER — POLYETHYLENE GLYCOL 3350 17 GM/SCOOP PO POWD: ORAL | 0 refills | Status: DC

## 2016-11-25 MED ORDER — METRONIDAZOLE 500 MG PO TABS: ORAL_TABLET | ORAL | 0 refills | Status: AC

## 2016-11-25 MED ORDER — NEOMYCIN SULFATE 500 MG PO TABS: ORAL_TABLET | ORAL | 0 refills | Status: DC

## 2016-11-25 NOTE — Patient Instructions (Signed)
The patient is aware to call back for any questions or concerns.  

## 2016-11-25 NOTE — Progress Notes (Signed)
Patient ID: Cheryl Cannon, female   DOB: 06/17/1936, 81 y.o.   MRN: [id number]  Chief Complaint  Patient presents with  . Mass    colon    HPI Cheryl Cannon is a 81 y.o. female here today for a evaluation of a colon mass. Colonoscopy was 11-01-16 that showed 2 lesions in the ascending colon, the larger with high-grade dysplasia, the smaller with frank malignancy.  She does admit to lower abdominal pain with bloating that is random in nature. Her bowel habits are irregular. She is here today with her husband, Camika Marsico of 30 years.   HPI  Past Medical History:  Diagnosis Date  . A-fib ([place])   . Cancer ([place])   . Celiac disease   . CHF (congestive heart failure) ([place])   . COPD (chronic obstructive pulmonary disease) ([place])   . Dementia   . Lung cancer ([place]) 2010   [facility] with chemo/rad/surgery  . Memory deficit   . Neuropathy ([place])   . Pneumonia 2017  . SSS (sick sinus syndrome) [facility])     Past Surgical History:  Procedure Laterality Date  . COLONOSCOPY  11/01/2016   in [place] a Dr Gaspar Garbe  . EYE SURGERY     cataract  . LUNG CANCER SURGERY    . LUNG LOBECTOMY Right 09/21/2010  . PACEMAKER INSERTION    . PACEMAKER PLACEMENT  02/16/2011    Family History  Problem Relation Age of Onset  . Throat cancer Mother   . Cancer Sister     Social History Social History  Substance Use Topics  . Smoking status: Former Smoker    Packs/day: 1.00    Years: 44.00    Quit date: 11/22/1998  . Smokeless tobacco: Never Used  . Alcohol use Yes     Comment: social    Allergies  Allergen Reactions  . Gluten Meal Other (See Comments)    GI UPSET/ Celiac disease.  . Parabens Rash    Current Outpatient Prescriptions  Medication Sig Dispense Refill  . albuterol (PROVENTIL HFA;VENTOLIN HFA) 108 (90 Base) MCG/ACT inhaler Inhale 2 puffs into the lungs every 6 (six) hours as needed for wheezing or shortness of breath. 1 Inhaler 0  . ALPRAZolam (XANAX) 0.25 MG tablet Take 0.25 mg by  mouth daily as needed for anxiety.     Marland Kitchen apixaban (ELIQUIS) 5 MG TABS tablet Take 1 tablet (5 mg total) by mouth 2 (two) times daily. 60 tablet 0  . cyanocobalamin (,VITAMIN B-12,) 1000 MCG/ML injection Inject 1,000 mcg into the muscle every 30 (thirty) days. Injection once a month  Next is due 12-20-2016    . docusate sodium (COLACE) 100 MG capsule Take 100 mg by mouth daily.     Marland Kitchen donepezil (ARICEPT) 10 MG tablet Take 1 tablet (10 mg total) by mouth at bedtime. 90 tablet 3  . flecainide (TAMBOCOR) 50 MG tablet Take 75 mg by mouth 2 (two) times daily. Takes 1.5 tablets    . furosemide (LASIX) 20 MG tablet Take 1 tablet (20 mg total) by mouth daily. 30 tablet 0  . gabapentin (NEURONTIN) 300 MG capsule Take 4 capsules (1,200 mg total) by mouth 2 (two) times daily. 720 capsule 3  . memantine (NAMENDA) 10 MG tablet Take 1 tablet daily for 1 week, then increase to 1 tablet twice a day (Patient taking differently: Take 10 mg by mouth daily. ) 180 tablet 3  . metoprolol tartrate (LOPRESSOR) 25 MG tablet Take 12.5 mg by mouth at  bedtime.     . pantoprazole (PROTONIX) 40 MG tablet Take 40 mg by mouth daily.     . polyethylene glycol (MIRALAX / GLYCOLAX) packet Take 17 g by mouth daily as needed for mild constipation.     Marland Kitchen tiotropium (SPIRIVA) 18 MCG inhalation capsule Place 18 mcg into inhaler and inhale daily.     . Cholecalciferol (VITAMIN D) 2000 units CAPS Take 2,000 Units by mouth daily.    . Memantine HCl-Donepezil HCl (NAMZARIC) 7 & 14 & 21 &28 -10 MG C4PK Take 7 mg by mouth daily. 28 each 0  . [START ON 12/01/2016] metroNIDAZOLE (FLAGYL) 500 MG tablet Take one (1) tablet at 6 pm and one (1) tablet at 11 pm the evening prior to surgery. 2 tablet 0  . [START ON 12/01/2016] neomycin (MYCIFRADIN) 500 MG tablet Take 2 tablets at 6 pm and 2 tablets at 11 pm the evening prior to surgery. 4 tablet 0  . polyethylene glycol powder (GLYCOLAX/MIRALAX) powder 255 grams one bottle for bowel prep 255 g 0   No  current facility-administered medications for this visit.     Review of Systems Review of Systems  Constitutional: Negative.   Respiratory: Negative.   Cardiovascular: Negative.   Gastrointestinal: Positive for abdominal pain and diarrhea.    Blood pressure 110/70, pulse 68, resp. rate 12, height 5' (1.524 m), weight 106 lb (48.1 kg).  Physical Exam Physical Exam  Constitutional: She is oriented to person, place, and time. She appears well-developed and well-nourished.  HENT:  Mouth/Throat: Oropharynx is clear and moist.  Eyes: Conjunctivae are normal. No scleral icterus.  Neck: Neck supple.  Cardiovascular: Normal rate and regular rhythm.   Murmur heard.  Systolic murmur is present with a grade of 2/6  Mild lower leg edema  Pulmonary/Chest: Effort normal and breath sounds normal.  Abdominal: Soft. Normal appearance and bowel sounds are normal. There is no tenderness.  Lymphadenopathy:    She has no cervical adenopathy.  Neurological: She is alert and oriented to person, place, and time.  Skin: Skin is warm and dry.  Psychiatric: Her behavior is normal.    Data Reviewed Faxed copy of the colonoscopy dated 11/01/2016 10. The per endoscopist described a fungating, infiltrative and polypoid small mass in the cecum. Sedated 10 mm. This was biopsied. A 25 mm polyp in the ascending colon was identified as well and a fairly extensive resection undertaken including clip application 5.  Pathology reported a invasive, moderately differentiated adenocarcinoma. Testing for micro- satellite instability was pending at the time this report was completed.  The larger lesion was a tubular adenoma with high-grade dysplasia.  Cardiology evaluation of 11/29/2016 reviewed and discussed by phone with  Dr. Nehemiah Massed.  Discharge summary from December 2017 reviewed. Admitted with confusion. Elevated troponin secondary to demand ischemia.  CBC dated 11/02/2016 showed a hemoglobin of 12.2. Repeat on  December 16 shown that this had fallen to 10.3.  Basic metabolic panel from 13/06/6577 showed a minimally depressed chloride at a creatinine of 1.19., This was one day post colonoscopy. At the time of admission with confusion she showed mild hyponatremia with a sodium of 132.  Assessment    Cancer the ascending colon.  New onset anemia likely secondary to extensive polypectomy.  Long-standing coronary disease, atrial fibrillation.    Plan     Indications for surgical intervention were reviewed. She is at higher risk due to her multiple medical comorbidities, and also due to her early dementia which can impair  recovery.  Importance of early ambulation was discussed with both the patient and her husband.  She's been encouraged to bring gum to the hospital to help encourage early bowel function.  The risks associated with surgery including bleeding, infection, blood clots, etc. have been reviewed. In light of the identification of malignancy, we really don't have much option in that regard.  Patient's surgery has been scheduled for 12-02-16 at [facility]. This patient has been asked to complete a bowel prep with antibiotics. She will discontinue Eliquis 2 days prior to surgery.     I discussed with Dr. Nehemiah Massed whether the patient would require bridging therapy with Lovenox. He did not feel that this was necessary with the patient's primary indication for anticoagulation being her atrial fibrillation route than the episode of pulmonary embolus that occurred in December 2016.   This information has been scribed by Karie Fetch RN, BSN,BC.   Robert Bellow 11/30/2016, 10:55 AM

## 2016-11-26 ENCOUNTER — Telehealth: Payer: Self-pay

## 2016-11-26 ENCOUNTER — Ambulatory Visit (INDEPENDENT_AMBULATORY_CARE_PROVIDER_SITE_OTHER): Payer: Medicare Other | Admitting: Neurology

## 2016-11-26 ENCOUNTER — Encounter: Payer: Self-pay | Admitting: Neurology

## 2016-11-26 VITALS — BP 108/70 | HR 70 | Ht 60.0 in | Wt 108.2 lb

## 2016-11-26 DIAGNOSIS — F0390 Unspecified dementia without behavioral disturbance: Secondary | ICD-10-CM | POA: Diagnosis not present

## 2016-11-26 DIAGNOSIS — G609 Hereditary and idiopathic neuropathy, unspecified: Secondary | ICD-10-CM

## 2016-11-26 DIAGNOSIS — F03A0 Unspecified dementia, mild, without behavioral disturbance, psychotic disturbance, mood disturbance, and anxiety: Secondary | ICD-10-CM

## 2016-11-26 MED ORDER — MEMANTINE HCL-DONEPEZIL HCL 7 & 14 & 21 &28 -10 MG PO C4PK: 7.0000 mg | EXTENDED_RELEASE_CAPSULE | Freq: Every day | ORAL | 0 refills | Status: DC

## 2016-11-26 NOTE — Telephone Encounter (Signed)
-----   Message from Dominga Ferry, [place] sent at 11/25/2016  4:11 PM EST ----- Please contact patient with pre-admit date and time once [facility] schedules. Thanks.

## 2016-11-26 NOTE — Patient Instructions (Signed)
1. Once things have settled down with your surgery, start the Namzaric starter pack as instructed. When you start the medication, stop the Aricept and Namenda pills (these are both in the capsules of Namzaric). 2. Wishing you well with your surgery 3. Follow-up as scheduled in March

## 2016-11-26 NOTE — Progress Notes (Signed)
NEUROLOGY FOLLOW UP OFFICE NOTE  Cheryl Cannon [id number]  HISTORY OF PRESENT ILLNESS: I had the pleasure of seeing Cheryl Cannon in follow-up in the neurology clinic on 11/26/2016.  The patient was last seen 2 months ago for mild dementia and peripheral neuropathy. She is again accompanied by her husband who helps supplement the history today. MMSE in October 2017 was 22/30, a decline within a 8-monthperiod from 26/30 in March 2017. Namenda was added to Aricept. She did not tolerate BID dosing of Namenda, and only takes '10mg'$  at night. On BID dosing, it made her "silly, a little crazy." She felt confused. Taking the medication at night helps, she feels clear in the morning. She is scheduled for colon surgery next week to remove cancerous polyps. She reports neuropathy is mostly okay, she is taking gabapentin '1200mg'$  BID. She denies any headaches, dizziness, diplopia, dysarthria/dysphagia, neck/back pain. No falls.  HPI: This is a very pleasant 81yo RH woman with a history of non-small cell lung cancer s/p surgery, chemotherapy and radiation, atrial fibrillation s/p pacemaker placement, celiac disease, with mild dementia and neuropathy.  1. Memory loss. She and her husband started noticing memory changes at the beginning of 2015. She reports that her "memory has never been great," but that it became more noticeable where she would forget names of familiar people or forget recent conversations, repeat questions. She occasionally has word-finding difficulties, and has noticed it more lately. Her husband has always made sure she takes her medications. She cooks and has not left the stove on. She drove without any difficulties, and after recent neuropsychological evaluation, she [facility] a [facility]. Her husband has been in charge of the bills since her diagnosis of cancer in 2010. Her husband also noted difficulties with reasoning and problem solving. No  personality changes or hallucinations. She had seen neurologist Dr. CCarmelina Paddock head CT with and without contrast done in 03/2014 did not show any acute changes, no evidence of brain metastasis. Routine EEG 02/2014 normal. She underwent Neuropsychological evaluation on 03/28/14, results reviewed, impression of mild dementia most likely secondary to Alzheimer's disease (rule out mixed dementia), with significant deficits in memory, semantic fluency, processing speed, and aspects of executive functioning. There was also evidence that these deficits are beginning to interfere with ability to perform complex tasks. Cognitive profile reflective of both cortical and subcortical involvement. There is evidence of hippocampal consolidation dysfunction, which is highly concerning for Alzheimer's disease. She also has several vascular risk factors which could be contributing to subcortical dysfunction and a mixed dementia. She started Aricept, with no side effects except she now "dreams a lot," affecting her sleep.  2. Neuropathy. She reports numbness and tingling in her fingertips over the past year. She states that symptoms "seemed to progress" after she was diagnosed with neuropathy in 03/2014. She now has paresthesias (tingling) from the bottom of her feet to the top of her head. She can't taste as much due to tingling in her tongue. 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 Needle exam not done due to anticoagulation with Pradaxa. Neuropathy labs available indicate normal SPEP. She is on B12 replacement. She had been taking gabapentin '900mg'$  BID for 3 years for shingles in the buttock region. She had tried increasing this to '900mg'$  TID but did not notice much difference, no side effects. She denies any neck or back pain. She has occasional generalized body weakness where "  my legs won't work," attributed to shortness of breath. No bowel/bladder dysfunction. Celiac disease is  controlled on strict diet.   PAST MEDICAL HISTORY: Past Medical History:  Diagnosis Date  . A-fib ([place])   . Cancer ([place])   . Celiac disease   . CHF (congestive heart failure) ([place])   . COPD (chronic obstructive pulmonary disease) ([place])   . Lung cancer ([place]) 2010   [facility] with chemo/rad/surgery  . Memory deficit   . Neuropathy ([place])   . Pneumonia 2017  . SSS (sick sinus syndrome) [facility])     MEDICATIONS: Current Outpatient Prescriptions on File Prior to Visit  Medication Sig Dispense Refill  . albuterol (PROVENTIL HFA;VENTOLIN HFA) 108 (90 Base) MCG/ACT inhaler Inhale 2 puffs into the lungs every 6 (six) hours as needed for wheezing or shortness of breath. 1 Inhaler 0  . ALPRAZolam (XANAX) 0.25 MG tablet Take 0.25 mg by mouth daily as needed for anxiety.     Marland Kitchen apixaban (ELIQUIS) 5 MG TABS tablet Take 1 tablet (5 mg total) by mouth 2 (two) times daily. 60 tablet 0  . Cholecalciferol (VITAMIN D) 2000 units CAPS Take 2,000 Units by mouth daily.    . cyanocobalamin (,VITAMIN B-12,) 1000 MCG/ML injection Inject 1,000 mcg into the muscle every 30 (thirty) days. Injection once a month  Next is due 12-20-2016    . docusate sodium (COLACE) 100 MG capsule Take 100 mg by mouth daily.     Marland Kitchen donepezil (ARICEPT) 10 MG tablet Take 1 tablet (10 mg total) by mouth at bedtime. 90 tablet 3  . flecainide (TAMBOCOR) 50 MG tablet Take 75 mg by mouth 2 (two) times daily. Takes 1.5 tablets    . furosemide (LASIX) 20 MG tablet Take 1 tablet (20 mg total) by mouth daily. 30 tablet 0  . gabapentin (NEURONTIN) 300 MG capsule Take 4 capsules (1,200 mg total) by mouth 2 (two) times daily. 720 capsule 3  . levalbuterol (XOPENEX HFA) 45 MCG/ACT inhaler Inhale 2 puffs into the lungs every 4 (four) hours as needed for wheezing.    . memantine (NAMENDA) 10 MG tablet Take 1 tablet daily for 1 week, then increase to 1 tablet twice a day (Patient taking differently: Take 10 mg by mouth daily. ) 180 tablet 3  . metoprolol  tartrate (LOPRESSOR) 25 MG tablet Take 12.5 mg by mouth at bedtime.     Derrill Memo ON 12/01/2016] metroNIDAZOLE (FLAGYL) 500 MG tablet Take one (1) tablet at 6 pm and one (1) tablet at 11 pm the evening prior to surgery. (Patient not taking: Reported on 11/26/2016) 2 tablet 0  . [START ON 12/01/2016] neomycin (MYCIFRADIN) 500 MG tablet Take 2 tablets at 6 pm and 2 tablets at 11 pm the evening prior to surgery. (Patient not taking: Reported on 11/26/2016) 4 tablet 0  . pantoprazole (PROTONIX) 40 MG tablet Take 40 mg by mouth daily.     . polyethylene glycol (MIRALAX / GLYCOLAX) packet Take 17 g by mouth daily as needed for mild constipation.     . polyethylene glycol powder (GLYCOLAX/MIRALAX) powder 255 grams one bottle for bowel prep (Patient not taking: Reported on 11/26/2016) 255 g 0  . tiotropium (SPIRIVA) 18 MCG inhalation capsule Place 18 mcg into inhaler and inhale daily.      No current facility-administered medications on file prior to visit.     ALLERGIES: Allergies  Allergen Reactions  . Gluten Meal Nausea And Vomiting  . Gluten Meal Other (See Comments)  GI UPSET  . Parabens Rash    FAMILY HISTORY: Family History  Problem Relation Age of Onset  . Throat cancer Mother   . Cancer Sister     SOCIAL HISTORY: Social History   Social History  . Marital status: Married    Spouse name: N/A  . Number of children: N/A  . Years of education: N/A   Occupational History  . Not on file.   Social History Main Topics  . Smoking status: Former Smoker    Packs/day: 1.00    Years: 44.00    Quit date: 11/22/1998  . Smokeless tobacco: Never Used  . Alcohol use Yes     Comment: social  . Drug use: No  . Sexual activity: Yes    Birth control/ protection: Other-see comments     Comment: post menopausal   Other Topics Concern  . Not on file   Social History Narrative   ** Merged History Encounter **        REVIEW OF SYSTEMS: Constitutional: No fevers, chills, or sweats, no  generalized fatigue, change in appetite Eyes: No visual changes, double vision, eye pain Ear, nose and throat: No hearing loss, ear pain, nasal congestion, sore throat Cardiovascular: No chest pain, palpitations Respiratory:  No shortness of breath at rest or with exertion, wheezes GastrointestinaI: No nausea, vomiting, diarrhea, abdominal pain, fecal incontinence Genitourinary:  No dysuria, urinary retention or frequency Musculoskeletal:  No neck pain, back pain Integumentary: No rash, pruritus, skin lesions Neurological: as above Psychiatric: No depression, insomnia, anxiety Endocrine: No palpitations, fatigue, diaphoresis, mood swings, change in appetite, change in weight, increased thirst Hematologic/Lymphatic:  No anemia, purpura, petechiae. Allergic/Immunologic: no itchy/runny eyes, nasal congestion, recent allergic reactions, rashes  PHYSICAL EXAM: Vitals:   11/26/16 1118  BP: 108/70  Pulse: 70   General: No acute distress Head:  Normocephalic/atraumatic Neck: supple, no paraspinal tenderness, full range of motion Heart:  Regular rate and rhythm Lungs:  Clear to auscultation bilaterally Back: No paraspinal tenderness Skin/Extremities: No rash, no edema Neurological Exam: alert and oriented to person, place, and date, did not know day of week. No aphasia or dysarthria. Fund of knowledge is appropriate.  Remote memory intact. 2/3 delayed recall.  Attention and concentration are normal. Missed one letter spelling WORLD backward.  Able to name objects and repeat phrases. Cranial nerves: Pupils equal, round, reactive to light.  Extraocular movements intact with no nystagmus. Visual fields full. Facial sensation intact. No facial asymmetry. Tongue, uvula, palate midline.  Motor: Bulk and tone normal, muscle strength 5/5 throughout with no pronator drift.  Sensation to light touch intact.  No extinction to double simultaneous stimulation.  Deep tendon reflexes 2+ throughout except for  absent ankle jerks bilaterally, toes downgoing.  Finger to nose testing intact.  Gait narrow-based and steady, able to tandem walk adequately.  Romberg slight sway.  IMPRESSION: This is a very pleasant 81 yo RH woman with a history of mild dementia and sensorimotor polyneuropathy, etiology possibly due to chemotherapy. She also has celiac disease which can also cause neuropathy. Celiac disease is under control with diet. On her last visit, she reported worsening memory, and a decline was seen with MMSE testing from 26/30 to 22/30 in a 63-monthperiod. Namenda was added to Aricept, but she is not tolerating BID dosing and only takes '10mg'$  at night of Namenda. She will try Namzaric, she was given a titration packet. She is scheduled for colon surgery next week and will start the Namzaric once  she is back to baseline and feeling her usual self. Side effects were discussed, she knows to call for any changes. She already has a follow-up scheduled in March and will keep appointment.   Thank you for allowing me to participate in her care.  Please do not hesitate to call for any questions or concerns.  The duration of this appointment visit was 15 minutes of face-to-face time with the patient.  Greater than 50% of this time was spent in counseling, explanation of diagnosis, planning of further management, and coordination of care.   Ellouise Newer, M.D.   CC: Dr. Candiss Norse

## 2016-11-26 NOTE — Telephone Encounter (Signed)
Patient notified that pre admit testing appointment at [facility] is on 11/29/16 at 2:30 pm. The patient is aware of date, time, and instructions.

## 2016-11-29 ENCOUNTER — Other Ambulatory Visit: Payer: Self-pay | Admitting: General Surgery

## 2016-11-29 ENCOUNTER — Encounter
Admission: RE | Admit: 2016-11-29 | Discharge: 2016-11-29 | Disposition: A | Discharge status: Home / Self Care | Payer: Medicare Other | Source: Ambulatory Visit | Attending: General Surgery | Admitting: General Surgery

## 2016-11-29 DIAGNOSIS — Z85118 Personal history of other malignant neoplasm of bronchus and lung: Secondary | ICD-10-CM | POA: Insufficient documentation

## 2016-11-29 DIAGNOSIS — Z01812 Encounter for preprocedural laboratory examination: Secondary | ICD-10-CM | POA: Insufficient documentation

## 2016-11-29 DIAGNOSIS — C182 Malignant neoplasm of ascending colon: Secondary | ICD-10-CM

## 2016-11-29 HISTORY — DX: Unspecified dementia, unspecified severity, without behavioral disturbance, psychotic disturbance, mood disturbance, and anxiety: F03.90

## 2016-11-29 LAB — SURGICAL PCR SCREEN
MRSA, PCR: NEGATIVE
Staphylococcus aureus: NEGATIVE

## 2016-11-29 NOTE — Patient Instructions (Signed)
  Your procedure is scheduled on: Thurs. 12/02/16 Report to Day Surgery. To find out your arrival time please call [phone] between [place] on Wed. 12/01/16.  Remember: Instructions that are not followed completely may result in serious medical risk, up to and including death, or upon the discretion of your surgeon and anesthesiologist your surgery may need to be rescheduled.    __x__ 1. Do not eat food or drink liquids after midnight. No gum chewing or hard candies.     __x__ 2. No Alcohol for 24 hours before or after surgery.   ____ 3. Do Not Smoke For 24 Hours Prior to Your Surgery.   ____ 4. Bring all medications with you on the day of surgery if instructed.    __x__ 5. Notify your doctor if there is any change in your medical condition     (cold, fever, infections).       Do not wear jewelry, make-up, hairpins, clips or nail polish.  Do not wear lotions, powders, or perfumes. You may wear deodorant.  Do not shave 48 hours prior to surgery. Men may shave face and neck2.  Do not bring valuables to the hospital.    [facility] is not responsible for any belongings or valuables.               Contacts, dentures or bridgework may not be worn into surgery.  Leave your suitcase in the car. After surgery it may be brought to your room.  For patients admitted to the hospital, discharge time is determined by your                treatment team.   Patients discharged the day of surgery will not be allowed to drive home.   Please read over the following fact sheets that you were given:      _x___ Take these medicines the morning of surgery with A SIP OF WATER:    1. flecainide (TAMBOCOR) 50 MG tablet  2. gabapentin (NEURONTIN) 300 MG capsule  3. pantoprazole (PROTONIX) 40 MG tablet  4.  5.  6.  ____ Fleet Enema (as directed)   __x__ Use CHG Soap as directed  __x__ Use inhalers on the day of surgerytiotropium (SPIRIVA) 18 MCG inhalation capsule, albuterol (PROVENTIL  HFA;VENTOLIN HFA) 108 (90 Base) MCG/ACT inhaler and bring with you to the hospital  ____ Stop metformin 2 days prior to surgery    ____ Take 1/2 of usual insulin dose the night before surgery and none on the morning of surgery.   __x__ Stop Eloquis tonight last dose  __x__ Stop Anti-inflammatories on may take tylenol only   ____ Stop supplements until after surgery.    ____ Bring C-Pap to the hospital.

## 2016-11-30 DIAGNOSIS — C182 Malignant neoplasm of ascending colon: Secondary | ICD-10-CM | POA: Insufficient documentation

## 2016-11-30 LAB — CEA: CEA: 2.2 ng/mL (ref 0.0–4.7)

## 2016-12-01 MED ORDER — SODIUM CHLORIDE 0.9 % IV SOLN
1.0000 g | INTRAVENOUS | Status: AC
  Administered 2016-12-02: 1 g via INTRAVENOUS
  Filled 2016-12-01: qty 1

## 2016-12-02 ENCOUNTER — Ambulatory Visit: Payer: Medicare Other | Admitting: Anesthesiology

## 2016-12-02 ENCOUNTER — Encounter: Payer: Self-pay | Admitting: *Deleted

## 2016-12-02 ENCOUNTER — Encounter
Admission: RE | Disposition: A | Discharge status: Skilled Nursing Facility | Payer: Self-pay | Source: Ambulatory Visit | Attending: General Surgery

## 2016-12-02 ENCOUNTER — Inpatient Hospital Stay
Admission: RE | Admit: 2016-12-02 | Discharge: 2016-12-10 | DRG: 330 | Disposition: A | Discharge status: Skilled Nursing Facility | Payer: Medicare Other | Source: Ambulatory Visit | Attending: General Surgery | Admitting: General Surgery

## 2016-12-02 DIAGNOSIS — E871 Hypo-osmolality and hyponatremia: Secondary | ICD-10-CM | POA: Diagnosis present

## 2016-12-02 DIAGNOSIS — K645 Perianal venous thrombosis: Secondary | ICD-10-CM | POA: Diagnosis present

## 2016-12-02 DIAGNOSIS — I5042 Chronic combined systolic (congestive) and diastolic (congestive) heart failure: Secondary | ICD-10-CM | POA: Diagnosis present

## 2016-12-02 DIAGNOSIS — I429 Cardiomyopathy, unspecified: Secondary | ICD-10-CM | POA: Diagnosis present

## 2016-12-02 DIAGNOSIS — R2681 Unsteadiness on feet: Secondary | ICD-10-CM

## 2016-12-02 DIAGNOSIS — R627 Adult failure to thrive: Secondary | ICD-10-CM | POA: Diagnosis present

## 2016-12-02 DIAGNOSIS — E440 Moderate protein-calorie malnutrition: Secondary | ICD-10-CM | POA: Insufficient documentation

## 2016-12-02 DIAGNOSIS — C182 Malignant neoplasm of ascending colon: Secondary | ICD-10-CM

## 2016-12-02 DIAGNOSIS — I130 Hypertensive heart and chronic kidney disease with heart failure and stage 1 through stage 4 chronic kidney disease, or unspecified chronic kidney disease: Secondary | ICD-10-CM | POA: Diagnosis present

## 2016-12-02 DIAGNOSIS — K625 Hemorrhage of anus and rectum: Secondary | ICD-10-CM | POA: Diagnosis not present

## 2016-12-02 DIAGNOSIS — I071 Rheumatic tricuspid insufficiency: Secondary | ICD-10-CM | POA: Diagnosis present

## 2016-12-02 DIAGNOSIS — Z923 Personal history of irradiation: Secondary | ICD-10-CM

## 2016-12-02 DIAGNOSIS — Z7901 Long term (current) use of anticoagulants: Secondary | ICD-10-CM | POA: Diagnosis not present

## 2016-12-02 DIAGNOSIS — C180 Malignant neoplasm of cecum: Principal | ICD-10-CM | POA: Diagnosis present

## 2016-12-02 DIAGNOSIS — Z9221 Personal history of antineoplastic chemotherapy: Secondary | ICD-10-CM

## 2016-12-02 DIAGNOSIS — R001 Bradycardia, unspecified: Secondary | ICD-10-CM | POA: Diagnosis not present

## 2016-12-02 DIAGNOSIS — Z86711 Personal history of pulmonary embolism: Secondary | ICD-10-CM

## 2016-12-02 DIAGNOSIS — F0390 Unspecified dementia without behavioral disturbance: Secondary | ICD-10-CM | POA: Diagnosis present

## 2016-12-02 DIAGNOSIS — N183 Chronic kidney disease, stage 3 (moderate): Secondary | ICD-10-CM | POA: Diagnosis present

## 2016-12-02 DIAGNOSIS — Z85118 Personal history of other malignant neoplasm of bronchus and lung: Secondary | ICD-10-CM

## 2016-12-02 DIAGNOSIS — M6281 Muscle weakness (generalized): Secondary | ICD-10-CM

## 2016-12-02 DIAGNOSIS — I480 Paroxysmal atrial fibrillation: Secondary | ICD-10-CM | POA: Diagnosis present

## 2016-12-02 DIAGNOSIS — Z950 Presence of cardiac pacemaker: Secondary | ICD-10-CM | POA: Diagnosis not present

## 2016-12-02 DIAGNOSIS — J449 Chronic obstructive pulmonary disease, unspecified: Secondary | ICD-10-CM | POA: Diagnosis present

## 2016-12-02 DIAGNOSIS — C189 Malignant neoplasm of colon, unspecified: Secondary | ICD-10-CM | POA: Diagnosis present

## 2016-12-02 DIAGNOSIS — Z87891 Personal history of nicotine dependence: Secondary | ICD-10-CM

## 2016-12-02 DIAGNOSIS — R0602 Shortness of breath: Secondary | ICD-10-CM

## 2016-12-02 HISTORY — PX: COLON RESECTION: SHX5231

## 2016-12-02 SURGERY — LAPAROSCOPIC RIGHT COLON RESECTION
Anesthesia: General | Laterality: Right | Wound class: Clean Contaminated

## 2016-12-02 MED ORDER — PHENYLEPHRINE 40 MCG/ML (10ML) SYRINGE FOR IV PUSH (FOR BLOOD PRESSURE SUPPORT)
PREFILLED_SYRINGE | INTRAVENOUS | Status: AC
  Filled 2016-12-02: qty 10

## 2016-12-02 MED ORDER — TIOTROPIUM BROMIDE MONOHYDRATE 18 MCG IN CAPS
18.0000 ug | ORAL_CAPSULE | Freq: Every day | RESPIRATORY_TRACT | Status: DC
  Administered 2016-12-03 – 2016-12-10 (×7): 18 ug via RESPIRATORY_TRACT
  Filled 2016-12-02 (×2): qty 5

## 2016-12-02 MED ORDER — ACETAMINOPHEN 10 MG/ML IV SOLN
INTRAVENOUS | Status: DC | PRN
  Administered 2016-12-02: 1000 mg via INTRAVENOUS

## 2016-12-02 MED ORDER — BUPIVACAINE HCL (PF) 0.5 % IJ SOLN
INTRAMUSCULAR | Status: DC | PRN
  Administered 2016-12-02: 30 mL

## 2016-12-02 MED ORDER — ALVIMOPAN 12 MG PO CAPS
ORAL_CAPSULE | ORAL | Status: AC
  Administered 2016-12-02: 12 mg via ORAL
  Filled 2016-12-02: qty 1

## 2016-12-02 MED ORDER — ONDANSETRON HCL 4 MG/2ML IJ SOLN
INTRAMUSCULAR | Status: AC
  Filled 2016-12-02: qty 2

## 2016-12-02 MED ORDER — ALBUTEROL SULFATE (2.5 MG/3ML) 0.083% IN NEBU: 3.0000 mL | INHALATION_SOLUTION | Freq: Four times a day (QID) | RESPIRATORY_TRACT | Status: DC | PRN

## 2016-12-02 MED ORDER — ROCURONIUM BROMIDE 100 MG/10ML IV SOLN
INTRAVENOUS | Status: DC | PRN
  Administered 2016-12-02: 10 mg via INTRAVENOUS
  Administered 2016-12-02: 40 mg via INTRAVENOUS

## 2016-12-02 MED ORDER — LACTATED RINGERS IV SOLN
INTRAVENOUS | Status: DC
  Administered 2016-12-02 (×2): via INTRAVENOUS

## 2016-12-02 MED ORDER — ARTIFICIAL TEARS OP OINT
TOPICAL_OINTMENT | OPHTHALMIC | Status: AC
  Filled 2016-12-02: qty 3.5

## 2016-12-02 MED ORDER — ROCURONIUM BROMIDE 50 MG/5ML IV SOSY
PREFILLED_SYRINGE | INTRAVENOUS | Status: AC
  Filled 2016-12-02: qty 5

## 2016-12-02 MED ORDER — IBUPROFEN 400 MG PO TABS
200.0000 mg | ORAL_TABLET | ORAL | Status: DC | PRN
  Administered 2016-12-03: 200 mg via ORAL
  Filled 2016-12-02: qty 1

## 2016-12-02 MED ORDER — MEMANTINE HCL 5 MG PO TABS
10.0000 mg | ORAL_TABLET | Freq: Every day | ORAL | Status: DC
  Administered 2016-12-02 – 2016-12-10 (×9): 10 mg via ORAL
  Filled 2016-12-02: qty 1
  Filled 2016-12-02 (×9): qty 2

## 2016-12-02 MED ORDER — OXYCODONE HCL 5 MG PO TABS
2.5000 mg | ORAL_TABLET | ORAL | Status: DC | PRN
  Administered 2016-12-04: 2.5 mg via ORAL
  Filled 2016-12-02: qty 1

## 2016-12-02 MED ORDER — ONDANSETRON HCL 4 MG/2ML IJ SOLN: 4.0000 mg | Freq: Four times a day (QID) | INTRAMUSCULAR | Status: DC | PRN

## 2016-12-02 MED ORDER — FENTANYL CITRATE (PF) 100 MCG/2ML IJ SOLN
25.0000 ug | INTRAMUSCULAR | Status: DC | PRN
  Administered 2016-12-02 (×4): 25 ug via INTRAVENOUS

## 2016-12-02 MED ORDER — DEXAMETHASONE SODIUM PHOSPHATE 10 MG/ML IJ SOLN
INTRAMUSCULAR | Status: AC
  Filled 2016-12-02: qty 1

## 2016-12-02 MED ORDER — ALVIMOPAN 12 MG PO CAPS
12.0000 mg | ORAL_CAPSULE | Freq: Two times a day (BID) | ORAL | Status: DC
  Administered 2016-12-02 – 2016-12-03 (×3): 12 mg via ORAL
  Filled 2016-12-02 (×3): qty 1

## 2016-12-02 MED ORDER — ACETAMINOPHEN 10 MG/ML IV SOLN
INTRAVENOUS | Status: AC
  Filled 2016-12-02: qty 100

## 2016-12-02 MED ORDER — LIDOCAINE 2% (20 MG/ML) 5 ML SYRINGE
INTRAMUSCULAR | Status: AC
Start: 2016-12-02 — End: 2016-12-02
  Filled 2016-12-02: qty 5

## 2016-12-02 MED ORDER — PANTOPRAZOLE SODIUM 40 MG PO TBEC
40.0000 mg | DELAYED_RELEASE_TABLET | Freq: Every day | ORAL | Status: DC
  Administered 2016-12-03 – 2016-12-10 (×8): 40 mg via ORAL
  Filled 2016-12-02 (×8): qty 1

## 2016-12-02 MED ORDER — DEXTROSE IN LACTATED RINGERS 5 % IV SOLN
INTRAVENOUS | Status: DC
  Administered 2016-12-02 – 2016-12-05 (×5): via INTRAVENOUS

## 2016-12-02 MED ORDER — ONDANSETRON HCL 4 MG/2ML IJ SOLN: 4.0000 mg | Freq: Once | INTRAMUSCULAR | Status: DC | PRN

## 2016-12-02 MED ORDER — ALVIMOPAN 12 MG PO CAPS
12.0000 mg | ORAL_CAPSULE | Freq: Once | ORAL | Status: AC
  Administered 2016-12-02: 12 mg via ORAL

## 2016-12-02 MED ORDER — MEMANTINE HCL-DONEPEZIL HCL 7 & 14 & 21 &28 -10 MG PO C4PK: 7.0000 mg | EXTENDED_RELEASE_CAPSULE | Freq: Every day | ORAL | Status: DC

## 2016-12-02 MED ORDER — FLECAINIDE ACETATE 50 MG PO TABS
75.0000 mg | ORAL_TABLET | Freq: Two times a day (BID) | ORAL | Status: DC
  Administered 2016-12-02 – 2016-12-10 (×16): 75 mg via ORAL
  Filled 2016-12-02 (×14): qty 2
  Filled 2016-12-02: qty 1
  Filled 2016-12-02 (×2): qty 2

## 2016-12-02 MED ORDER — FENTANYL CITRATE (PF) 100 MCG/2ML IJ SOLN
INTRAMUSCULAR | Status: AC
  Administered 2016-12-02: 25 ug via INTRAVENOUS
  Filled 2016-12-02: qty 2

## 2016-12-02 MED ORDER — BUPIVACAINE HCL (PF) 0.5 % IJ SOLN
INTRAMUSCULAR | Status: AC
  Filled 2016-12-02: qty 30

## 2016-12-02 MED ORDER — GABAPENTIN 400 MG PO CAPS
1200.0000 mg | ORAL_CAPSULE | Freq: Two times a day (BID) | ORAL | Status: DC
  Administered 2016-12-02 – 2016-12-10 (×16): 1200 mg via ORAL
  Filled 2016-12-02 (×16): qty 3

## 2016-12-02 MED ORDER — SUCCINYLCHOLINE CHLORIDE 200 MG/10ML IV SOSY
PREFILLED_SYRINGE | INTRAVENOUS | Status: AC
  Filled 2016-12-02: qty 10

## 2016-12-02 MED ORDER — ONDANSETRON HCL 4 MG/2ML IJ SOLN
INTRAMUSCULAR | Status: DC | PRN
  Administered 2016-12-02: 4 mg via INTRAVENOUS

## 2016-12-02 MED ORDER — EPHEDRINE SULFATE 50 MG/ML IJ SOLN
INTRAMUSCULAR | Status: DC | PRN
  Administered 2016-12-02 (×5): 10 mg via INTRAVENOUS

## 2016-12-02 MED ORDER — SUGAMMADEX SODIUM 200 MG/2ML IV SOLN
INTRAVENOUS | Status: DC | PRN
  Administered 2016-12-02: 98 mg via INTRAVENOUS

## 2016-12-02 MED ORDER — SUGAMMADEX SODIUM 200 MG/2ML IV SOLN
INTRAVENOUS | Status: AC
  Filled 2016-12-02: qty 2

## 2016-12-02 MED ORDER — METOPROLOL TARTRATE 25 MG PO TABS
12.5000 mg | ORAL_TABLET | Freq: Every day | ORAL | Status: DC
  Administered 2016-12-02 – 2016-12-05 (×4): 12.5 mg via ORAL
  Filled 2016-12-02 (×4): qty 1

## 2016-12-02 MED ORDER — DEXAMETHASONE SODIUM PHOSPHATE 10 MG/ML IJ SOLN
INTRAMUSCULAR | Status: DC | PRN
  Administered 2016-12-02: 4 mg via INTRAVENOUS

## 2016-12-02 MED ORDER — PROPOFOL 10 MG/ML IV BOLUS
INTRAVENOUS | Status: DC | PRN
  Administered 2016-12-02: 150 mg via INTRAVENOUS

## 2016-12-02 MED ORDER — MORPHINE SULFATE (PF) 4 MG/ML IV SOLN
2.0000 mg | INTRAVENOUS | Status: DC | PRN
Start: 2016-12-02 — End: 2016-12-03

## 2016-12-02 MED ORDER — FENTANYL CITRATE (PF) 100 MCG/2ML IJ SOLN
INTRAMUSCULAR | Status: AC
  Filled 2016-12-02: qty 2

## 2016-12-02 MED ORDER — ONDANSETRON 4 MG PO TBDP: 4.0000 mg | ORAL_TABLET | Freq: Four times a day (QID) | ORAL | Status: DC | PRN

## 2016-12-02 MED ORDER — ENOXAPARIN SODIUM 40 MG/0.4ML ~~LOC~~ SOLN
40.0000 mg | SUBCUTANEOUS | Status: DC
  Administered 2016-12-03: 40 mg via SUBCUTANEOUS
  Filled 2016-12-02: qty 0.4

## 2016-12-02 MED ORDER — ALPRAZOLAM 0.25 MG PO TABS
0.2500 mg | ORAL_TABLET | Freq: Every day | ORAL | Status: DC | PRN
  Administered 2016-12-05 – 2016-12-06 (×2): 0.25 mg via ORAL
  Filled 2016-12-02 (×2): qty 1

## 2016-12-02 MED ORDER — ACETAMINOPHEN 10 MG/ML IV SOLN
1000.0000 mg | Freq: Four times a day (QID) | INTRAVENOUS | Status: DC
  Administered 2016-12-02 – 2016-12-03 (×3): 1000 mg via INTRAVENOUS
  Filled 2016-12-02 (×4): qty 100

## 2016-12-02 MED ORDER — FENTANYL CITRATE (PF) 100 MCG/2ML IJ SOLN
INTRAMUSCULAR | Status: DC | PRN
  Administered 2016-12-02: 100 ug via INTRAVENOUS
  Administered 2016-12-02 (×2): 25 ug via INTRAVENOUS
  Administered 2016-12-02: 50 ug via INTRAVENOUS

## 2016-12-02 MED ORDER — GLYCOPYRROLATE 0.2 MG/ML IJ SOLN
INTRAMUSCULAR | Status: AC
  Filled 2016-12-02: qty 1

## 2016-12-02 MED ORDER — PHENYLEPHRINE HCL 10 MG/ML IJ SOLN
INTRAMUSCULAR | Status: DC | PRN
  Administered 2016-12-02: 500 ug via INTRAVENOUS
  Administered 2016-12-02: 40 ug via INTRAVENOUS
  Administered 2016-12-02: 100 ug via INTRAVENOUS

## 2016-12-02 MED ORDER — SUCCINYLCHOLINE CHLORIDE 20 MG/ML IJ SOLN
INTRAMUSCULAR | Status: DC | PRN
  Administered 2016-12-02: 100 mg via INTRAVENOUS

## 2016-12-02 MED ORDER — DONEPEZIL HCL 5 MG PO TABS
10.0000 mg | ORAL_TABLET | Freq: Every day | ORAL | Status: DC
  Administered 2016-12-02 – 2016-12-09 (×8): 10 mg via ORAL
  Filled 2016-12-02 (×8): qty 2

## 2016-12-02 MED ORDER — PROPOFOL 10 MG/ML IV BOLUS
INTRAVENOUS | Status: AC
  Filled 2016-12-02: qty 40

## 2016-12-02 MED ORDER — EPHEDRINE 5 MG/ML INJ
INTRAVENOUS | Status: AC
  Filled 2016-12-02: qty 10

## 2016-12-02 SURGICAL SUPPLY — 78 items
APPLIER CLIP ROT 10 11.4 M/L (STAPLE)
BLADE SURG 10 STRL SS SAFETY (BLADE) ×2 IMPLANT
BLADE SURG 11 STRL SS SAFETY (MISCELLANEOUS) ×2 IMPLANT
CANISTER SUCT 1200ML W/VALVE (MISCELLANEOUS) ×2 IMPLANT
CANNULA DILATOR 10 W/SLV (CANNULA) ×2 IMPLANT
CATH TRAY 16F METER LATEX (MISCELLANEOUS) IMPLANT
CHLORAPREP W/TINT 26ML (MISCELLANEOUS) ×2 IMPLANT
CLEANER CAUTERY TIP 5X5 PAD (MISCELLANEOUS) IMPLANT
CLIP APPLIE ROT 10 11.4 M/L (STAPLE) IMPLANT
COVER CLAMP SIL LG PBX B (MISCELLANEOUS) IMPLANT
DECANTER SPIKE VIAL GLASS SM (MISCELLANEOUS) ×2 IMPLANT
DEVICE HAND ACCESS DEXTUS (MISCELLANEOUS) IMPLANT
DRAPE LAP W/FLUID (DRAPES) IMPLANT
DRAPE UNDER BUTTOCK W/FLU (DRAPES) IMPLANT
DRESSING TELFA 4X3 1S ST N-ADH (GAUZE/BANDAGES/DRESSINGS) ×2 IMPLANT
DRSG OPSITE POSTOP 4X10 (GAUZE/BANDAGES/DRESSINGS) IMPLANT
DRSG OPSITE POSTOP 4X8 (GAUZE/BANDAGES/DRESSINGS) ×4 IMPLANT
DRSG TEGADERM 2-3/8X2-3/4 SM (GAUZE/BANDAGES/DRESSINGS) ×4 IMPLANT
DRSG TEGADERM 4X4.75 (GAUZE/BANDAGES/DRESSINGS) IMPLANT
DRSG TELFA 3X8 NADH (GAUZE/BANDAGES/DRESSINGS) IMPLANT
ELECT BLADE 6.5 EXT (BLADE) ×2 IMPLANT
ELECT CAUTERY BLADE 6.4 (BLADE) ×2 IMPLANT
ELECT REM PT RETURN 9FT ADLT (ELECTROSURGICAL) ×2
ELECTRODE REM PT RTRN 9FT ADLT (ELECTROSURGICAL) ×1 IMPLANT
FILTER LAP SMOKE EVAC STRL (MISCELLANEOUS) ×2 IMPLANT
GLOVE BIO SURGEON STRL SZ7 (GLOVE) ×10 IMPLANT
GLOVE BIO SURGEON STRL SZ7.5 (GLOVE) ×4 IMPLANT
GLOVE INDICATOR 8.0 STRL GRN (GLOVE) ×10 IMPLANT
GOWN STRL REUS W/ TWL LRG LVL3 (GOWN DISPOSABLE) ×8 IMPLANT
GOWN STRL REUS W/TWL LRG LVL3 (GOWN DISPOSABLE) ×8
IRRIGATION STRYKERFLOW (MISCELLANEOUS) IMPLANT
IRRIGATOR STRYKERFLOW (MISCELLANEOUS)
IV LACTATED RINGERS 1000ML (IV SOLUTION) IMPLANT
KIT PINK PAD W/HEAD ARE REST (MISCELLANEOUS) ×2
KIT PINK PAD W/HEAD ARM REST (MISCELLANEOUS) ×1 IMPLANT
KIT RM TURNOVER CYSTO AR (KITS) ×2 IMPLANT
LABEL OR SOLS (LABEL) ×2 IMPLANT
NDL INSUFF ACCESS 14 VERSASTEP (NEEDLE) ×2 IMPLANT
NEEDLE HYPO 22GX1.5 SAFETY (NEEDLE) ×2 IMPLANT
NS IRRIG 500ML POUR BTL (IV SOLUTION) ×2 IMPLANT
PACK COLON CLEAN CLOSURE (MISCELLANEOUS) ×2 IMPLANT
PACK LAP CHOLECYSTECTOMY (MISCELLANEOUS) ×2 IMPLANT
PAD CLEANER CAUTERY TIP 5X5 (MISCELLANEOUS)
PAD PREP 24X41 OB/GYN DISP (PERSONAL CARE ITEMS) ×2 IMPLANT
PENCIL ELECTRO HAND CTR (MISCELLANEOUS) ×4 IMPLANT
PROT DEXTUS HAND ACCESS (MISCELLANEOUS)
RELOAD PROXIMATE 75MM BLUE (ENDOMECHANICALS) ×2 IMPLANT
RETAINER VISCERA MED (MISCELLANEOUS) IMPLANT
RETRACTOR FIXED LENGTH SML (MISCELLANEOUS) IMPLANT
RETRACTOR WND ALEXIS-O 25 LRG (MISCELLANEOUS) IMPLANT
RETRACTOR WOUND ALXS 18CM MED (MISCELLANEOUS) ×1 IMPLANT
RTRCTR WOUND ALEXIS O 18CM MED (MISCELLANEOUS) ×2
RTRCTR WOUND ALEXIS O 25CM LRG (MISCELLANEOUS)
SCISSORS METZENBAUM CVD 33 (INSTRUMENTS) IMPLANT
SEAL FOR SCOPE WARMER C3101 (MISCELLANEOUS) ×2 IMPLANT
SET YANKAUER POOLE SUCT (MISCELLANEOUS) ×2 IMPLANT
SHEARS HARMONIC ACE PLUS 36CM (ENDOMECHANICALS) ×2 IMPLANT
SPONGE LAP 18X18 5 PK (GAUZE/BANDAGES/DRESSINGS) ×2 IMPLANT
STAPLER PROXIMATE 75MM BLUE (STAPLE) ×2 IMPLANT
STRIP CLOSURE SKIN 1/2X4 (GAUZE/BANDAGES/DRESSINGS) ×2 IMPLANT
SUT PROLENE 0 CT 1 30 (SUTURE) ×2 IMPLANT
SUT SILK 2 0 (SUTURE) ×1
SUT SILK 2-0 18XBRD TIE 12 (SUTURE) ×1 IMPLANT
SUT SILK 2-0 30XBRD TIE 12 (SUTURE) IMPLANT
SUT SILK 3-0 (SUTURE) ×4 IMPLANT
SUT VIC AB 2-0 BRD 54 (SUTURE) IMPLANT
SUT VIC AB 2-0 CT1 27 (SUTURE) ×1
SUT VIC AB 2-0 CT1 TAPERPNT 27 (SUTURE) ×1 IMPLANT
SUT VIC AB 3-0 54X BRD REEL (SUTURE) ×1 IMPLANT
SUT VIC AB 3-0 BRD 54 (SUTURE) ×1
SUT VIC AB 3-0 SH 27 (SUTURE) ×2
SUT VIC AB 3-0 SH 27X BRD (SUTURE) ×2 IMPLANT
SUT VIC AB 4-0 FS2 27 (SUTURE) ×2 IMPLANT
SYR BULB IRRIG 60ML STRL (SYRINGE) ×2 IMPLANT
TROCAR XCEL NON-BLD 11X100MML (ENDOMECHANICALS) ×2 IMPLANT
TROCAR XCEL UNIV SLVE 11M 100M (ENDOMECHANICALS) ×2 IMPLANT
TUBING INSUFFLATOR HEATED (MISCELLANEOUS) ×2 IMPLANT
WATER STERILE IRR 1000ML POUR (IV SOLUTION) IMPLANT

## 2016-12-02 NOTE — Anesthesia Procedure Notes (Signed)
Procedure Name: Intubation Date/Time: 12/02/2016 7:50 AM Performed by: Jennette Bill Pre-anesthesia Checklist: Patient identified, Emergency Drugs available, Suction available, Patient being monitored and Timeout performed Patient Re-evaluated:Patient Re-evaluated prior to inductionOxygen Delivery Method: Circle system utilized Preoxygenation: Pre-oxygenation with 100% oxygen Intubation Type: IV induction Ventilation: Mask ventilation without difficulty Laryngoscope Size: Mac and 3 Grade View: Grade II Tube type: Oral Tube size: 7.0 mm Airway Equipment and Method: Stylet Placement Confirmation: ETT inserted through vocal cords under direct vision,  positive ETCO2 and breath sounds checked- equal and bilateral Secured at: 21 cm Tube secured with: Tape Dental Injury: Teeth and Oropharynx as per pre-operative assessment

## 2016-12-02 NOTE — Anesthesia Preprocedure Evaluation (Signed)
Anesthesia Evaluation  Patient identified by MRN, date of birth, ID band Patient awake    Reviewed: Allergy & Precautions, NPO status , Patient's Chart, lab work & pertinent test results  History of Anesthesia Complications Negative for: history of anesthetic complications  Airway Mallampati: II       Dental   Pulmonary COPD,  COPD inhaler, former smoker,           Cardiovascular hypertension, Pt. on home beta blockers and Pt. on medications +CHF       Neuro/Psych Mild dementia, short-term memory    GI/Hepatic negative GI ROS, Neg liver ROS,   Endo/Other  negative endocrine ROS  Renal/GU negative Renal ROS     Musculoskeletal   Abdominal   Peds  Hematology   Anesthesia Other Findings   Reproductive/Obstetrics                            Anesthesia Physical Anesthesia Plan  ASA: III  Anesthesia Plan: General   Post-op Pain Management:    Induction: Intravenous  Airway Management Planned: Oral ETT  Additional Equipment:   Intra-op Plan:   Post-operative Plan:   Informed Consent: I have reviewed the patients History and Physical, chart, labs and discussed the procedure including the risks, benefits and alternatives for the proposed anesthesia with the patient or authorized representative who has indicated his/her understanding and acceptance.     Plan Discussed with:   Anesthesia Plan Comments:         Anesthesia Quick Evaluation

## 2016-12-02 NOTE — Transfer of Care (Signed)
Immediate Anesthesia Transfer of Care Note  Patient: Cheryl Cannon  Procedure(s) Performed: Procedure(s): LAPAROSCOPIC RIGHT COLON RESECTION (Right)  Patient Location: PACU  Anesthesia Type:General  Level of Consciousness: awake, alert  and oriented  Airway & Oxygen Therapy: Patient Spontanous Breathing and Patient connected to face mask oxygen  Post-op Assessment: Report given to RN and Post -op Vital signs reviewed and stable  Post vital signs: Reviewed and stable  Last Vitals:  Vitals:   12/02/16 0609  BP: 132/61  Pulse: 69  Resp: 16  Temp: (!) 35.7 C    Last Pain:  Vitals:   12/02/16 0609  TempSrc: Tympanic  PainSc: 3          Complications: No apparent anesthesia complications

## 2016-12-02 NOTE — Op Note (Signed)
Preoperative diagnosis: Carcinoma the cecum.  Postoperative diagnosis: Same.  Operative procedure: Laparoscopically assisted right hemicolectomy with ileotransverse colostomy.  Operating surgeon: Ollen Bowl, M.D.  Assistant: Ellwood Sayers, M.D.  Anesthesia: Gen. endotracheal, Marcaine 0.5% plain, 30 mL.  Estimated blood loss 50-75 mL.  Clinical note: This 81 year old woman underwent screening colonoscopy was noted to have what appeared to be cancer in the cecum as well as a large tubulovillous polyp in the ascending colon. The latter was endoscopically resected. High-grade dysplasia was noted. She is admitted for elective colon resection because the identification of cancer.  Preoperative cardiology consultation had been obtained. Bridging therapy with anticoagulants was not felt indicated.  Operative note: The patient underwent general endotracheal anesthesia without difficulty. The abdomen was prepped with ChloraPrep and draped. An Trendelenburg position a varies needle was placed which an umbilical incision. After assuring intra-abdominal location with the hanging drop test the abdomen was insufflated with CO2 a 10 mmHg pressure. A 10 mm Step port was expanded and inspection showed no evidence of injury from initial port placement. An 11 mm XL port was placed in the hypogastrium and second 11 mm XL port placed in the left upper quadrant just lateral to the falciform ligament. The patient was rolled to the left. There was a little band of scarring near the cecal area but no evidence of tumor involvement. The transverse colon was identified and the mid transverse colon to the right was mobilized. The omentum was taken down with Harmonic scalpel dissection. The hepatic flexure was taken down and rolled medially. The terminal ileum and appendix were freed and the entire specimen could be rolled past the midline. The duodenum was visualized and protected. There was noted to be a.m. 30-75 mL of dark  tinged fluid in the abdominal cavity on initial insertion of the scope without clear source identified.  A midline incision approximately 7 cm in length was made extending primarily below the umbilicus due to its high location. The skin was incised sharply and the remaining dissection completed with thermal cautery. The palpation of the liver showed a fine granular texture but no nodularity. Both ovaries were identified and were atrophic. No peritoneal masses were noted. The wound protector was placed and the right colon mobilized. The mesentery was scored and the terminal ileum was divided approximate 4 inches proximal to its junction with the colon and the transverse colon divided just lateral to the colic vessel. The mesentery was controlled with hemostasis with the harmonic scalpel as well as 2-0 Vicryl and 2-0 silk ties. No palpable adenopathy. A side-to-side functional and end-to-end anastomosis was completed with 2 firings of the GIA 75 mm stapler. Good hemostasis was noted after the first firing. A 3 fingerbreadth palpable anastomosis was noted after second firing. The edges of the anastomosis were made hemostatic with cautery and the corners reinforced with 3-0 Vicryl figure-of-eight sutures. The mesentery defect was closed with a running 3-0 Vicryl suture. A branch of the middle colic vessel was ligated with 3-0 Vicryl for hemostasis. A lap placed in the right upper quadrant showed no evidence of bleeding. The area was irrigated and good hemostasis was noted. The anastomosis was returned to the abdominal cavity at which time surgeon's gowns gloves and the surgical site was redraped. Final hemostasis was assured and the fashion the midline closed with interrupted 0 Prolene figure-of-eight sutures in a single layer. The adipose tissue was approximated with a running 2-0 Vicryl suture and the skin closed with a running 4-0 Vicryl subcuticular  suture. The trocar sites were closed with 4-0 Vicryl subcuticular  sutures. Telfa and Tegaderm dressings were applied after Steri-Strips.  The patient tolerated the procedure well was taken to recovery in stable condition.  The surgical specimen was opened and the tumor at cecum as well as the site of previous clip application was identified.

## 2016-12-02 NOTE — OR Nursing (Signed)
Patient resting comfortably on left side. She denies only slight soreness. Hospital room 201 is ready for her. Patient transferred to rm 201.

## 2016-12-02 NOTE — Anesthesia Postprocedure Evaluation (Signed)
Anesthesia Post Note  Patient: Cheryl Cannon  Procedure(s) Performed: Procedure(s) (LRB): LAPAROSCOPIC RIGHT COLON RESECTION (Right)  Patient location during evaluation: PACU Anesthesia Type: General Level of consciousness: awake and alert Pain management: pain level controlled Vital Signs Assessment: post-procedure vital signs reviewed and stable Respiratory status: spontaneous breathing and respiratory function stable Cardiovascular status: stable Anesthetic complications: no     Last Vitals:  Vitals:   12/02/16 0609 12/02/16 0948  BP: 132/61 122/68  Pulse: 69   Resp: 16   Temp: (!) 35.7 C 36.7 C    Last Pain:  Vitals:   12/02/16 0948  TempSrc:   PainSc: 7                  Nour Scalise K

## 2016-12-02 NOTE — H&P (Signed)
No change in clinical exam or history.  For right colon resection.

## 2016-12-03 LAB — CBC
HEMATOCRIT: 31.2 % — AB (ref 35.0–47.0)
Hemoglobin: 10.6 g/dL — ABNORMAL LOW (ref 12.0–16.0)
MCH: 35.1 pg — ABNORMAL HIGH (ref 26.0–34.0)
MCHC: 33.8 g/dL (ref 32.0–36.0)
MCV: 103.9 fL — AB (ref 80.0–100.0)
PLATELETS: 108 10*3/uL — AB (ref 150–440)
RBC: 3.01 MIL/uL — AB (ref 3.80–5.20)
RDW: 16.3 % — ABNORMAL HIGH (ref 11.5–14.5)
WBC: 7.6 10*3/uL (ref 3.6–11.0)

## 2016-12-03 LAB — CREATININE, SERUM
Creatinine, Ser: 0.92 mg/dL (ref 0.44–1.00)
GFR calc non Af Amer: 58 mL/min — ABNORMAL LOW (ref >60)

## 2016-12-03 MED ORDER — ACETAMINOPHEN 325 MG PO TABS
650.0000 mg | ORAL_TABLET | ORAL | Status: DC | PRN
  Administered 2016-12-03 – 2016-12-10 (×12): 650 mg via ORAL
  Filled 2016-12-03 (×13): qty 2

## 2016-12-03 MED ORDER — MORPHINE SULFATE (PF) 2 MG/ML IV SOLN: 2.0000 mg | INTRAVENOUS | Status: DC | PRN

## 2016-12-03 NOTE — Progress Notes (Signed)
Voiding, watery dark brown stool mixed-in X 2 episodes; c/o mild abdominal pain with ambulation to BR; Barbaraann Faster, RN 5:30 AM 12/03/2016

## 2016-12-03 NOTE — Progress Notes (Signed)
Afebrile. BP modestly low overnight. Better this AM. Has ambulated around the circle by her report. Lungs: Clear. Cardio: RR. ABD: soft. Wound: Small amount of old blood.  CBC: No fall in HGB. Platelet at 108K. Normal WBC.  IMP:  Doing well. Plan: Advance diet.

## 2016-12-03 NOTE — Plan of Care (Signed)
Problem: Bowel/Gastric: Goal: Gastrointestinal status for postoperative course will improve Outcome: Progressing Watery stool overnight X 2.

## 2016-12-03 NOTE — Care Management Important Message (Signed)
Important Message  Patient Details  Name: Cheryl Cannon MRN: [id number] Date of Birth: [birth year]   Medicare Important Message Given:  Yes    Beverly Sessions, RN 12/03/2016, 3:05 PM

## 2016-12-04 LAB — CBC
HEMATOCRIT: 29.8 % — AB (ref 35.0–47.0)
HEMOGLOBIN: 10.1 g/dL — AB (ref 12.0–16.0)
MCH: 34.4 pg — AB (ref 26.0–34.0)
MCHC: 33.9 g/dL (ref 32.0–36.0)
MCV: 101.5 fL — AB (ref 80.0–100.0)
Platelets: 111 10*3/uL — ABNORMAL LOW (ref 150–440)
RBC: 2.94 MIL/uL — ABNORMAL LOW (ref 3.80–5.20)
RDW: 16.4 % — ABNORMAL HIGH (ref 11.5–14.5)
WBC: 6.6 10*3/uL (ref 3.6–11.0)

## 2016-12-04 MED ORDER — ENOXAPARIN SODIUM 30 MG/0.3ML ~~LOC~~ SOLN
30.0000 mg | SUBCUTANEOUS | Status: DC
Start: 2016-12-04 — End: 2016-12-06
  Administered 2016-12-04 – 2016-12-06 (×3): 30 mg via SUBCUTANEOUS
  Filled 2016-12-04 (×3): qty 0.3

## 2016-12-04 NOTE — Progress Notes (Signed)
AVSS. BP trending up. Minimal flatus, small BM by patient report.  Troubled day yesterday, ran out of steam mid afternoon. More abdominal soreness, some improvement with Tylenol. Wants to avoid MS as if affects her thinking. Reminded she has an oral pain pill available. Lungs: Clear. Inspirex at 1000. Cardio: RR ABD: Soft, mild RLQ tenderness.  Small hematoma at local injection site right side of abdomen. Will d/c entereg as she is using minimal narcotics. Will decrease Lovenox as based on weight she is receiving an almost full anticoagulation dose. Encouraged ambulation.

## 2016-12-04 NOTE — Progress Notes (Signed)
Pt. Ambulate from room to nurses station and became SOB nurse ask pt. How she feels pt. States "winded" pt. Assisted to chair O2 sat 89% room air, assisted back to room O2 [place] applied 2L Sats 94%. Pt. In no distress. Family at bedside

## 2016-12-05 MED ORDER — HYDROCORTISONE 2.5 % RE CREA
TOPICAL_CREAM | Freq: Three times a day (TID) | RECTAL | Status: DC | PRN
  Filled 2016-12-05: qty 28.35

## 2016-12-05 MED ORDER — HYDROCODONE-ACETAMINOPHEN 5-325 MG PO TABS
1.0000 | ORAL_TABLET | ORAL | Status: DC | PRN
  Filled 2016-12-05: qty 1

## 2016-12-05 NOTE — Progress Notes (Signed)
AVSS. Sats 99%. Winded with ambulation yesterday. Reports she saw a small amount of dark blood and had some burning around the anus in the last 24 hours. Denies SOB/ CP at this time. Lungs: Clear. Cardio: RR. ABD: Minimal distension, BS+, soft. Wound: Dressings removed, no fresh drainage. Oxycodone, 2.5 mg " Too strong".  Will try Norco. Will d/c IV fluids, advance diet and see how she does w/ ambulation today.  Assess in AM whether she gets to go home or whether short term rehab is in order.

## 2016-12-05 NOTE — Plan of Care (Signed)
Problem: Education: Goal: Understanding of discharge needs will improve Outcome: Progressing Education progressing but pt will need reinforcement due to memory impairment from dementia.  Dola Argyle, RN

## 2016-12-06 ENCOUNTER — Inpatient Hospital Stay: Payer: Medicare Other

## 2016-12-06 LAB — CBC
HEMATOCRIT: 30.7 % — AB (ref 35.0–47.0)
HEMOGLOBIN: 10.7 g/dL — AB (ref 12.0–16.0)
MCH: 35.7 pg — ABNORMAL HIGH (ref 26.0–34.0)
MCHC: 35 g/dL (ref 32.0–36.0)
MCV: 102.2 fL — ABNORMAL HIGH (ref 80.0–100.0)
Platelets: 136 10*3/uL — ABNORMAL LOW (ref 150–440)
RBC: 3.01 MIL/uL — AB (ref 3.80–5.20)
RDW: 16 % — ABNORMAL HIGH (ref 11.5–14.5)
WBC: 5.8 10*3/uL (ref 3.6–11.0)

## 2016-12-06 LAB — CREATININE, SERUM
Creatinine, Ser: 1.01 mg/dL — ABNORMAL HIGH (ref 0.44–1.00)
GFR, EST NON AFRICAN AMERICAN: 52 mL/min — AB (ref >60)

## 2016-12-06 MED ORDER — APIXABAN 5 MG PO TABS
5.0000 mg | ORAL_TABLET | Freq: Two times a day (BID) | ORAL | Status: DC
  Administered 2016-12-06 – 2016-12-08 (×6): 5 mg via ORAL
  Filled 2016-12-06 (×6): qty 1

## 2016-12-06 MED ORDER — FUROSEMIDE 20 MG PO TABS
20.0000 mg | ORAL_TABLET | Freq: Every day | ORAL | Status: DC
  Administered 2016-12-06 – 2016-12-10 (×5): 20 mg via ORAL
  Filled 2016-12-06 (×7): qty 1

## 2016-12-06 MED ORDER — IOPAMIDOL (ISOVUE-370) INJECTION 76%
75.0000 mL | Freq: Once | INTRAVENOUS | Status: AC | PRN
  Administered 2016-12-06: 75 mL via INTRAVENOUS

## 2016-12-06 NOTE — Consult Note (Signed)
[facility] Cardiology  CARDIOLOGY CONSULT NOTE  Patient ID: Cheryl Cannon MRN: [id number] DOB/AGE: [birth year] 81 y.o.  Admit date: 12/02/2016 Referring Physician Tollie Pizza Primary Physician Candiss Norse Primary Cardiologist Nehemiah Massed Reason for Consultation Dyspnea on exertion  HPI: 81 year old female referred for dyspnea on exertion. Patient has a history of sick sinus syndrome, status post dual-chamber pacemaker in 2012, paroxysmal atrial fibrillation, severe tricuspid regurgitation, COPD on nocturnal oxygen therapy, lung cancer status post right lobectomy, and pulmonary embolism while on Pradaxa in 10/2015. Following failure of Pradaxa, patient was switched to Eliquis, which she has been on until 2 days prior to this operation. Patient was admitted on 12/02/2016 for laproscopic right hemicolectomy. Patient states that she has a history of dyspnea on exertion, but it has been worse post-operatively. She states that she does not feel fluid-overloaded. Admission labs notable for stable hemoglobin and hematocrit, and serum creatinine 1.01. Echocardiogram on 11/26/2016 revealed LV EF of 40% with severe tricuspid regurgitation and moderate aortic regurgitation, which was unchanged from echo from 2016. Currently, she is lying supine in bed in no acute distress, and denies chest pain, orthopnea, palpitations, peripheral edema, or shortness of breath on 2L nasal cannula.  Review of systems complete and found to be negative unless listed above     Past Medical History:  Diagnosis Date  . A-fib ([place])   . Cancer ([place])   . Celiac disease   . CHF (congestive heart failure) ([place])   . COPD (chronic obstructive pulmonary disease) ([place])   . Dementia   . Lung cancer ([place]) 2010   [facility] with chemo/rad/surgery  . Memory deficit   . Neuropathy ([place])   . Pneumonia 2017  . SSS (sick sinus syndrome) [facility])     Past Surgical History:  Procedure Laterality Date  . COLON RESECTION Right 12/02/2016   Procedure: LAPAROSCOPIC RIGHT  COLON RESECTION;  Surgeon: Robert Bellow, MD;  Location: [facility] ORS;  Service: General;  Laterality: Right;  . COLONOSCOPY  11/01/2016   in [place] a Dr Gaspar Garbe  . EYE SURGERY     cataract  . LUNG CANCER SURGERY    . LUNG LOBECTOMY Right 09/21/2010  . PACEMAKER INSERTION    . PACEMAKER PLACEMENT  02/16/2011    Prescriptions Prior to Admission  Medication Sig Dispense Refill Last Dose  . albuterol (PROVENTIL HFA;VENTOLIN HFA) 108 (90 Base) MCG/ACT inhaler Inhale 2 puffs into the lungs every 6 (six) hours as needed for wheezing or shortness of breath. 1 Inhaler 0 12/02/2016 at Unknown time  . ALPRAZolam (XANAX) 0.25 MG tablet Take 0.25 mg by mouth daily as needed for anxiety.    11/30/2016  . apixaban (ELIQUIS) 5 MG TABS tablet Take 1 tablet (5 mg total) by mouth 2 (two) times daily. 60 tablet 0 11/29/2016  . Cholecalciferol (VITAMIN D) 2000 units CAPS Take 2,000 Units by mouth daily.   12/01/2016 at Unknown time  . cyanocobalamin (,VITAMIN B-12,) 1000 MCG/ML injection Inject 1,000 mcg into the muscle every 30 (thirty) days. Injection once a month  Next is due 12-20-2016   11/19/2016  . docusate sodium (COLACE) 100 MG capsule Take 100 mg by mouth daily.    12/01/2016 at Unknown time  . donepezil (ARICEPT) 10 MG tablet Take 1 tablet (10 mg total) by mouth at bedtime. 90 tablet 3 12/01/2016 at Unknown time  . flecainide (TAMBOCOR) 50 MG tablet Take 75 mg by mouth 2 (two) times daily. Takes 1.5 tablets   12/02/2016 at Unknown time  . furosemide (LASIX) 20  MG tablet Take 1 tablet (20 mg total) by mouth daily. 30 tablet 0 12/01/2016 at Unknown time  . gabapentin (NEURONTIN) 300 MG capsule Take 4 capsules (1,200 mg total) by mouth 2 (two) times daily. 720 capsule 3 12/02/2016 at Unknown time  . memantine (NAMENDA) 10 MG tablet Take 1 tablet daily for 1 week, then increase to 1 tablet twice a day (Patient taking differently: Take 10 mg by mouth daily. ) 180 tablet 3 12/01/2016 at Unknown time  . metoprolol  tartrate (LOPRESSOR) 25 MG tablet Take 12.5 mg by mouth at bedtime.    12/01/2016 at Unknown time  . pantoprazole (PROTONIX) 40 MG tablet Take 40 mg by mouth daily.    12/02/2016 at Unknown time  . polyethylene glycol (MIRALAX / GLYCOLAX) packet Take 17 g by mouth daily as needed for mild constipation.    12/01/2016 at Unknown time  . tiotropium (SPIRIVA) 18 MCG inhalation capsule Place 18 mcg into inhaler and inhale daily.    12/02/2016 at Unknown time  . Memantine HCl-Donepezil HCl (NAMZARIC) 7 & 14 & 21 &28 -10 MG C4PK Take 7 mg by mouth daily. 28 each 0   . neomycin (MYCIFRADIN) 500 MG tablet Take 2 tablets at 6 pm and 2 tablets at 11 pm the evening prior to surgery. 4 tablet 0 Taking  . polyethylene glycol powder (GLYCOLAX/MIRALAX) powder 255 grams one bottle for bowel prep 255 g 0 Taking   Social History   Social History  . Marital status: Married    Spouse name: N/A  . Number of children: N/A  . Years of education: N/A   Occupational History  . Not on file.   Social History Main Topics  . Smoking status: Former Smoker    Packs/day: 1.00    Years: 44.00    Quit date: 11/22/1998  . Smokeless tobacco: Never Used  . Alcohol use Yes     Comment: social  . Drug use: No  . Sexual activity: Yes    Birth control/ protection: Other-see comments     Comment: post menopausal   Other Topics Concern  . Not on file   Social History Narrative   ** Merged History Encounter **        Family History  Problem Relation Age of Onset  . Throat cancer Mother   . Cancer Sister       Review of systems complete and found to be negative unless listed above      PHYSICAL EXAM  General: Well developed, well nourished, in no acute distress HEENT:  Normocephalic and atramatic Neck:  No JVD.  Lungs: Clear bilaterally to auscultation Heart: HRRR . Normal S1 and S2. 3/6 systolic murmur Abdomen: Non-distended, soft, tender Msk:  Patient supine in bed Extremities: No clubbing, cyanosis. Trace  bilateral lower extremity edema.   Neuro: Alert and oriented X 3. Psych:  Good affect, responds appropriately  Labs:   Lab Results  Component Value Date   WBC 5.8 12/06/2016   HGB 10.7 (L) 12/06/2016   HCT 30.7 (L) 12/06/2016   MCV 102.2 (H) 12/06/2016   PLT 136 (L) 12/06/2016    Recent Labs Lab 12/06/16 0435  CREATININE 1.01*   Lab Results  Component Value Date   TROPONINI 0.03 ([place]) 11/06/2016   No results found for: CHOL No results found for: HDL No results found for: [place] No results found for: TRIG No results found for: CHOLHDL No results found for: LDLDIRECT    Radiology: Dg Chest [facility]  1 View  Result Date: 12/06/2016 CLINICAL DATA:  Shortness of breath.  History of lung cancer. EXAM: PORTABLE CHEST 1 VIEW COMPARISON:  11/05/2016 FINDINGS: Postoperative right chest with volume loss and costophrenic sulcus blunting. Density around the right mid chest suture lines is diminished. No acute opacity noted. Normal heart size. Dual-chamber pacer leads from the left are in stable position. No acute osseous finding. IMPRESSION: 1. No acute finding. 2. Stable postoperative changes in the right chest. 3. Emphysema. Electronically Signed   By: Monte Fantasia M.D.   On: 12/06/2016 10:24    EKG: Junctional rhythm vs. atrial fibrillation with pacing spikes with question of inappropriate sensing  ASSESSMENT AND PLAN:  1. Dyspnea on exertion, possibly due in part to pacemaker mode causing bradycardia. She does not appear to be fluid-overloaded as lungs are clear, trace lower extremity edema, and no orthopnea. Oxygen saturation in the high 90s. Recent echocardiogram revealed EF of 40%, which is unchanged from echo in 2016.  2. Paroxysmal atrial fibrillation, rate controlled and asymptomatic.  Recommendations: 1. Agree with current therapy. 2. Reprogram pacemaker to DDDR mode to improve heart rate. 3. Restart PO Lasix 20 mg 4. Restart Eliquis 5 mg PO BID  5. Discontinue Lovenox 6. If  patient not clinically improving after Lasix and pacemaker reprogramming, consider Chest CT. 7. Further recommendations pending patient's course.   Signed: Magda Muise, PA-C 12/06/2016, 1:34 PM

## 2016-12-06 NOTE — Progress Notes (Signed)
[place] responded to the Nurse's request to visit with Pt. CH met with Pt and husband. Pt talked to [facility] about her health struggles, but was still hopeful she would get better. CH told Pt that [facility] is available and offered the Pt prayers for strength and healing, which the Pt and her husband appreciated.     12/06/16 1500  Clinical Encounter Type  Visited With Patient;Family  Visit Type Initial;Spiritual support  Referral From Nurse  Consult/Referral To Chaplain  Spiritual Encounters  Spiritual Needs Prayer  Stress Factors  Patient Stress Factors Health changes  Family Stress Factors None identified

## 2016-12-06 NOTE — Progress Notes (Signed)
Pt refused to have IV restarted, wanted to wait until Dr Bary Castilla came today.

## 2016-12-06 NOTE — Progress Notes (Signed)
CT and report reviewed. No evidence PE. Study showed a left pleural effusion not evident on CXR. Suggestion of right heart dysfunction with reflux of contrast into the hepatic veins. BP running 185 systolic, doubt pressure would support IV lasix at this time for diuresis. Will hold beta blocker at this time, po lasix and observation.

## 2016-12-06 NOTE — Progress Notes (Signed)
Notified patient remained SOB with minimal exertion.  Case reviewed w/ Dr Murlean Caller from cardiology. Pacemaker reprogrammed this afternoon without discernable change in patient symptoms. CXR earlier in the day showed no interval change from December 2016. Significant right distortion secondary to previousl RML/RLLobectomy and radiation. Patient has a history of PE in December 2016 ( by report from Dr. Murlean Caller this occurred while on Pradaxa for management of atrial fibrillation. No AF on today's ECG. Will check chest CT to assess for new PE, but management would likely be Eliquis as was started earlier today.

## 2016-12-06 NOTE — Progress Notes (Signed)
Afebrile. BP low normal. Tolerating diet. Main complaint is peri-anal pain, esp w/ BM, poor exercise tolerance.  (Had been able to ambulate around the RN station pod#1, now, SOB after return from restroom. Minimal abdominal pain. Inspirex at 800. Lungs: Few fine crackles right base. Cardio: RR. ABD: Non-distended, soft. Wounds: Ecchmosis secondary to local anesthetic, otherwise unremarkable. Extrem: No evidence of edema.  Labs; HGB up at 10.7, WBC: 5,800. EGFR: 52 ml/min, 58 a few days ago. No major change.  Comfortable at rest. HX PE in 12/ 2016. No symptoms/ physical findings to suggest recurrent PE on exam (Rub, hypoxia, tachycardia) except for DOE.  Will have cardiology assess. Rehab vs d/c in play.

## 2016-12-07 LAB — CBC WITH DIFFERENTIAL/PLATELET
Basophils Absolute: 0 K/uL (ref 0–0.1)
Basophils Relative: 0 %
Eosinophils Absolute: 0 K/uL (ref 0–0.7)
Eosinophils Relative: 1 %
HCT: 31.1 % — ABNORMAL LOW (ref 35.0–47.0)
Hemoglobin: 10.9 g/dL — ABNORMAL LOW (ref 12.0–16.0)
Lymphocytes Relative: 5 %
Lymphs Abs: 0.4 K/uL — ABNORMAL LOW (ref 1.0–3.6)
MCH: 35.4 pg — ABNORMAL HIGH (ref 26.0–34.0)
MCHC: 35.1 g/dL (ref 32.0–36.0)
MCV: 100.8 fL — ABNORMAL HIGH (ref 80.0–100.0)
Monocytes Absolute: 1 K/uL — ABNORMAL HIGH (ref 0.2–0.9)
Monocytes Relative: 12 %
Neutro Abs: 6.7 K/uL — ABNORMAL HIGH (ref 1.4–6.5)
Neutrophils Relative %: 82 %
Platelets: 155 K/uL (ref 150–440)
RBC: 3.09 MIL/uL — ABNORMAL LOW (ref 3.80–5.20)
RDW: 16.8 % — ABNORMAL HIGH (ref 11.5–14.5)
WBC: 8.1 K/uL (ref 3.6–11.0)

## 2016-12-07 LAB — BASIC METABOLIC PANEL
Anion gap: 10 (ref 5–15)
BUN: 19 mg/dL (ref 6–20)
CHLORIDE: 87 mmol/L — AB (ref 101–111)
CO2: 23 mmol/L (ref 22–32)
Calcium: 8.3 mg/dL — ABNORMAL LOW (ref 8.9–10.3)
Creatinine, Ser: 1.16 mg/dL — ABNORMAL HIGH (ref 0.44–1.00)
GFR calc Af Amer: 51 mL/min — ABNORMAL LOW (ref >60)
GFR calc non Af Amer: 44 mL/min — ABNORMAL LOW (ref >60)
GLUCOSE: 99 mg/dL (ref 65–99)
POTASSIUM: 4.4 mmol/L (ref 3.5–5.1)
SODIUM: 120 mmol/L — AB (ref 135–145)

## 2016-12-07 LAB — HEPATIC FUNCTION PANEL
ALK PHOS: 94 U/L (ref 38–126)
ALT: 12 U/L — AB (ref 14–54)
AST: 21 U/L (ref 15–41)
Albumin: 3.1 g/dL — ABNORMAL LOW (ref 3.5–5.0)
Bilirubin, Direct: 0.5 mg/dL (ref 0.1–0.5)
Indirect Bilirubin: 0.8 mg/dL (ref 0.3–0.9)
Total Bilirubin: 1.3 mg/dL — ABNORMAL HIGH (ref 0.3–1.2)
Total Protein: 5.3 g/dL — ABNORMAL LOW (ref 6.5–8.1)

## 2016-12-07 MED ORDER — ENSURE ENLIVE PO LIQD
237.0000 mL | Freq: Two times a day (BID) | ORAL | Status: DC
  Administered 2016-12-07 – 2016-12-10 (×4): 237 mL via ORAL

## 2016-12-07 NOTE — Care Management (Addendum)
POD 5.  Lives at home with husband.  Maintaining sats on RA. PT consult pending.

## 2016-12-07 NOTE — Progress Notes (Signed)
[facility] Cardiology  SUBJECTIVE: Patient states she feels better this morning. She ambulated to the bathroom this morning with less difficulty and easier breathing.    Vitals:   12/06/16 1310 12/06/16 2023 12/07/16 0422 12/07/16 0825  BP: 104/60 (!) 90/45 (!) 94/47 (!) 89/47  Pulse: 68 62 61 60  Resp: '18 20 19 20  '$ Temp:  97.4 F (36.3 C) 97.3 F (36.3 C) 97.5 F (36.4 C)  TempSrc:  Oral Oral Oral  SpO2: 99% 100% 100% 100%  Weight:      Height:         Intake/Output Summary (Last 24 hours) at 12/07/16 0940 Last data filed at 12/07/16 0422  Gross per 24 hour  Intake              240 ml  Output                0 ml  Net              240 ml      PHYSICAL EXAM  General: Well developed, well nourished, in no acute distress HEENT:  Normocephalic and atramatic Neck:  No JVD.  Lungs: LLL crackles Heart: HRRR . Normal S1 and S2, 6-3/8 systolic murmur Abdomen: Bowel sounds are positive, abdomen soft and non-tender  Msk:  Patient lying bed Extremities: No clubbing, cyanosis or edema.   Neuro: Alert and oriented X 3. Psych:  Good affect, responds appropriately   LABS: Basic Metabolic Panel:  Recent Labs  12/06/16 0435 12/07/16 0324  NA  --  120*  K  --  4.4  CL  --  87*  CO2  --  23  GLUCOSE  --  99  BUN  --  19  CREATININE 1.01* 1.16*  CALCIUM  --  8.3*   Liver Function Tests:  Recent Labs  12/07/16 0324  AST 21  ALT 12*  ALKPHOS 94  BILITOT 1.3*  PROT 5.3*  ALBUMIN 3.1*   No results for input(s): LIPASE, AMYLASE in the last 72 hours. CBC:  Recent Labs  12/06/16 0435 12/07/16 0324  WBC 5.8 8.1  NEUTROABS  --  6.7*  HGB 10.7* 10.9*  HCT 30.7* 31.1*  MCV 102.2* 100.8*  PLT 136* 155   Cardiac Enzymes: No results for input(s): CKTOTAL, CKMB, CKMBINDEX, TROPONINI in the last 72 hours. BNP: Invalid input(s): POCBNP D-Dimer: No results for input(s): DDIMER in the last 72 hours. Hemoglobin A1C: No results for input(s): HGBA1C in the last 72  hours. Fasting Lipid Panel: No results for input(s): CHOL, HDL, LDLCALC, TRIG, CHOLHDL, LDLDIRECT in the last 72 hours. Thyroid Function Tests: No results for input(s): TSH, T4TOTAL, T3FREE, THYROIDAB in the last 72 hours.  Invalid input(s): FREET3 Anemia Panel: No results for input(s): VITAMINB12, FOLATE, FERRITIN, TIBC, IRON, RETICCTPCT in the last 72 hours.  Ct Angio Chest Pe W Or Wo Contrast  Result Date: 12/06/2016 CLINICAL DATA:  Shortness of breath with exertion history of right middle and lower lobe lobectomy and radiation EXAM: CT ANGIOGRAPHY CHEST WITH CONTRAST TECHNIQUE: Multidetector CT imaging of the chest was performed using the standard protocol during bolus administration of intravenous contrast. Multiplanar CT image reconstructions and MIPs were obtained to evaluate the vascular anatomy. CONTRAST:  75 mL Isovue 370 intravenous COMPARISON:  Chest x-ray 12/06/2016, CT chest 07/17/2016, 11/12/2015 FINDINGS: Cardiovascular: Satisfactory opacification of the pulmonary arteries to the segmental level. No evidence of pulmonary embolism. Norm non aneurysmal aorta. Atherosclerotic calcifications are present. Coronary artery calcifications. Heart  size borderline to mildly enlarged. Small pericardial effusion. Mediastinum/Nodes: Thyroid grossly unremarkable. Trachea and mainstem bronchi within normal limits. Mild air distention of the upper esophagus. Lungs/Pleura: Volume loss on the right with prior lobectomy changes noted. Stable moderate emphysematous disease bilaterally. Scattered areas of fibrosis in the right lung, some of which are likely related to post treatment changes. Chronic changes in the right lung base. Scattered hazy densities in the right mid and upper lung could reflect superimposed infection or inflammatory process. Mild ground-glass density in the subpleural left upper lobe. Ill-defined 4 mm ground-glass stellate density in the subpleural lingula. 1.2 x 1.4 cm slightly  spiculated nodule in the left lower lobe, series 6, image number 69. Small moderate persistent effusion. Calcified granuloma in the left lower lobe. No pneumothorax. Upper Abdomen: Marked reflux of contrast into the hepatic venous system. No acute abnormality in the upper abdomen. Musculoskeletal: Degenerative changes of the spine. No acute or suspicious bone lesion. Moderate generalized body wall edema. Review of the MIP images confirms the above findings. IMPRESSION: 1. No convincing evidence for acute pulmonary embolus. 2. Volume loss on the right with prior lobectomy changes. Moderate emphysematous disease bilaterally. Scattered areas of fibrosis in the right lung, stable at the lung base. Scattered hazy densities within the right mid and upper lung as well as the left upper lobe, could reflect acute infectious or inflammatory process. 3. 1.4 cm slightly spiculated nodule in the left lower lobe, cannot exclude metastatic focus or primary lung carcinoma. Suggest correlation with PET-CT. 4. Small to moderate left pleural effusion. 5. Marked reflux of contrast into the hepatic venous system suggests elevated right heart pressure 6. Moderate generalized body wall edema consistent with anasarca Electronically Signed   By: Donavan Foil M.D.   On: 12/06/2016 20:15   Dg Chest Port 1 View  Result Date: 12/06/2016 CLINICAL DATA:  Shortness of breath.  History of lung cancer. EXAM: PORTABLE CHEST 1 VIEW COMPARISON:  11/05/2016 FINDINGS: Postoperative right chest with volume loss and costophrenic sulcus blunting. Density around the right mid chest suture lines is diminished. No acute opacity noted. Normal heart size. Dual-chamber pacer leads from the left are in stable position. No acute osseous finding. IMPRESSION: 1. No acute finding. 2. Stable postoperative changes in the right chest. 3. Emphysema. Electronically Signed   By: Monte Fantasia M.D.   On: 12/06/2016 10:24     Echo LV EF 40%, severe tricuspid  regurgitation, moderate aortic regurgitation, unchanged from echo 2016   ASSESSMENT AND PLAN:  Active Problems:   Colon cancer ([place])    1. Dyspnea on exertion, improved with reprogramming of pacemaker mode from AAI to DDDR. Continues to not appear fluid overloaded. Chest CT negative for PE. 2. Paroxysmal atrial fibrillation, rate controlled, asymptomatic, and restarted on Eliquis for stroke prevention.  Recommendations: 1. Agree with current therapy. 2. No further cardiac diagnostics at this time.  3. Consider discharge today or tomorrow if patient continues to improve, not worsening shortness of breath.  4. Plan to follow-up with Dr. Nehemiah Massed as outpatient.    Kaitlynne Wenz, PA-C 12/07/2016 9:40 AM

## 2016-12-07 NOTE — Care Management Important Message (Signed)
Important Message  Patient Details  Name: Cheryl Cannon MRN: [id number] Date of Birth: [birth year]   Medicare Important Message Given:  Yes    Beverly Sessions, RN 12/07/2016, 4:27 PM

## 2016-12-07 NOTE — Progress Notes (Signed)
Afebrile. Subjectively reports feeling better. BP remains low at 90 +/- systolic. No dramatic change in pulse w/ hold on metoprolol. Denies much pain, reports appetite not back to normal. Less complaints from hemorrhoidal pain today. Lungs: Remain clear.  Inspirex at 800 with encouragement. Cardio: No evidence af. Murmurs unchanged. ABD: Scaphoid. Bruise right lower flank. Edema of skin (anasarca) involving gluteal area not really appreciated yesterday during rectal exam. (Yesterday, noted with small, 1 cm thrombosed hemorrhoid left posterior area. Patient reports this is better today. Extrem: No evidence of edema.. Labs: Mildly progressive hyponatremia, WBC nl, HGB stable, slight sag in eGFR to 42 after yesterday;s chest CT. Discussed w/ Dr Nehemiah Massed: With low BP aggressive diuresis may make hypotension worse, so will stay with present Lasix 20 mg / day. May be a candidate for short term rehab at [facility]. Discussed w/ care management. Patient and husband amenable.

## 2016-12-07 NOTE — Clinical Social Work Note (Signed)
Currently awaiting PT recommendation at this time. Patient and husband live at [place]. CSW has given the admission's coordinator a heads up at [facility] that there is a possibility PT will recommend STR and they are able to accommodate if needed. Shela Leff MSW,LCSW [phone]

## 2016-12-07 NOTE — Evaluation (Signed)
Physical Therapy Evaluation Patient Details Name: Cheryl Cannon MRN: [id number] DOB: 03/02/1936 Today's Date: 12/07/2016   History of Present Illness  Pt underwent laproscopiacally assisted R hemicolectomy with ileotransverse colostomy and is POD#5 at time of evaluation. Pt reports gradualy worsening of strength since surgery  Clinical Impression  Pt admitted with above diagnosis. Pt currently with functional limitations due to the deficits listed below (see PT Problem List). . Pt is very weak and deconditioned. She requires modA+1 for bed mobility and transfers. MinA+1 for short distance ambulation at EOB. Pt unable to ambulate further at this time due to weakness and fatigue. She will not be able to return home at discharge and will need SNF placement initially. Pt will benefit from skilled PT services to address deficits in strength, balance, and mobility in order to return to full function at home.     Follow Up Recommendations SNF    Equipment Recommendations  Rolling walker with 5" wheels    Recommendations for Other Services       Precautions / Restrictions Precautions Precautions: Fall Restrictions Weight Bearing Restrictions: No      Mobility  Bed Mobility Overal bed mobility: Needs Assistance Bed Mobility: Supine to Sit;Sit to Supine     Supine to sit: Mod assist Sit to supine: Mod assist   General bed mobility comments: Pt requires heavy cues for logrolling in bed and transitioning from L sidelying to sitting. HOB elevated and bed rails utilized. Pt encouraged to hug pillow to abdomen to brace and decrease pain. Pt moves slowly and with increased pain  Transfers Overall transfer level: Needs assistance Equipment used: Rolling walker (2 wheeled) Transfers: Sit to/from Stand Sit to Stand: Mod assist         General transfer comment: Pt requires heavy cues for safe hand placement and seqencing. LE weakness with increased time and assist required to come to  standing. Once upright requires cues for posture  Ambulation/Gait Ambulation/Gait assistance: Min assist Ambulation Distance (Feet): 5 Feet Assistive device: Rolling walker (2 wheeled) Gait Pattern/deviations: Decreased step length - right;Decreased step length - left Gait velocity: Decreased Gait velocity interpretation: <1.8 ft/sec, indicative of risk for recurrent falls General Gait Details: Pt requires minA+1 for small shuffling steps forward/backward at EOB. Pt requires cues for upright posture and sequencing with walker. Reports being too fatigued to attempt further ambulation at this time. Refuses up to recliner currently  Stairs            Wheelchair Mobility    Modified Rankin (Stroke Patients Only)       Balance Overall balance assessment: Needs assistance Sitting-balance support: No upper extremity supported Sitting balance-Leahy Scale: Fair     Standing balance support: Bilateral upper extremity supported Standing balance-Leahy Scale: Poor                               Pertinent Vitals/Pain Pain Assessment: 0-10 Pain Score: 6  Pain Location: Abdominal, unable to describe but states she feels swollen Pain Intervention(s): Monitored during session    [place] expects to be discharged to:: Private residence Living Arrangements: Spouse/significant other Available Help at Discharge: Family Type of Home: House Home Access: Level entry     Home Layout: One level Home Equipment: None      Prior Function Level of Independence: Independent         Comments: Pt is an independent community ambulator without assistive device. Independent with  ADLs/IADLs. No falls in the last 12 months. Wears O2 at 2L/min at night only     Hand Dominance   Dominant Hand: Right    Extremity/Trunk Assessment   Upper Extremity Assessment Upper Extremity Assessment: Generalized weakness    Lower Extremity Assessment Lower Extremity  Assessment: Generalized weakness       Communication   Communication: No difficulties  Cognition Arousal/Alertness: Awake/alert Behavior During Therapy: WFL for tasks assessed/performed Overall Cognitive Status: Within Functional Limits for tasks assessed                      General Comments      Exercises     Assessment/Plan    PT Assessment Patient needs continued PT services  PT Problem List Decreased strength;Decreased activity tolerance;Decreased balance;Decreased mobility;Cardiopulmonary status limiting activity;Pain          PT Treatment Interventions DME instruction;Gait training;Functional mobility training;Therapeutic activities;Therapeutic exercise;Balance training;Neuromuscular re-education;Patient/family education    PT Goals (Current goals can be found in the Care Plan section)  Acute Rehab PT Goals Patient Stated Goal: Return to prior function at home PT Goal Formulation: With patient/family Time For Goal Achievement: 04/20/17 Potential to Achieve Goals: Good    Frequency Min 2X/week   Barriers to discharge Decreased caregiver support Lives with husband who is also elderly    Co-evaluation               End of Session Equipment Utilized During Treatment: Gait belt Activity Tolerance: Patient limited by fatigue;Patient limited by pain Patient left: in bed;with bed alarm set;with call bell/phone within reach;with family/visitor present           Time: 1645-1710 PT Time Calculation (min) (ACUTE ONLY): 25 min   Charges:   PT Evaluation $PT Eval Moderate Complexity: 1 Procedure     PT G Codes:       Lyndel Safe Huprich PT, DPT   Huprich,Jason 12/07/2016, 5:28 PM

## 2016-12-07 NOTE — Progress Notes (Signed)
Initial Nutrition Assessment  DOCUMENTATION CODES:   Non-severe (moderate) malnutrition in context of chronic illness  INTERVENTION:  Provide Ensure Enlive po BID, each supplement provides 350 kcal and 20 grams of protein.   NUTRITION DIAGNOSIS:   Inadequate oral intake related to poor appetite as evidenced by per patient/family report.  GOAL:   Patient will meet greater than or equal to 90% of their needs  MONITOR:   PO intake, Supplement acceptance, Labs, Weight trends, I & O's  REASON FOR ASSESSMENT:   Diagnosis    ASSESSMENT:   81 year old female with PMHx of COPD, celiac disease, CHF, lung cancer (2010),  who presented with colon cancer now s/p laparoscopically assisted right hemicolectomy with ileotransverse colostomy on 1/11.     Spoke with patient and family members at bedside. Patient reports her appetite is poor but it was good PTA. Since her operation she reports she can only tolerate 3 small meals per day and water. Denies nausea or abdominal pain. Reports loose bowel movements.   Initially patient reported UBW 106 lbs. She then reported she has had a slow weight loss over the past few months. Per chart patient lost 8 lbs (7% body weight) over 5 months, which is not significant for time frame. Current body weight likely falsely elevated in setting of edema.   Meal Completion: 0-80%  Medications reviewed and include: Lasix 20 mg daily, pantoprazole.   Labs reviewed: Sodium 120, Chloride 87, Creatinine 1.16.   Nutrition-Focused physical exam completed. Findings are mild-moderate fat depletion, mild-moderate muscle depletion, and mild edema. Patient with general edema/anasarca noted in abdomen and bilateral upper and lower extremities on exam.   Diet Order:  DIET SOFT Room service appropriate? Yes; Fluid consistency: Thin  Skin:  Reviewed, no issues  Last BM:  12/06/2016  Height:   Ht Readings from Last 1 Encounters:  12/02/16 5' (1.524 m)    Weight:    Wt Readings from Last 1 Encounters:  12/02/16 108 lb (49 kg)    Ideal Body Weight:  45.5 kg  BMI:  Body mass index is 21.09 kg/m.  Estimated Nutritional Needs:   Kcal:  1470-1715 (30-35 kcal/kg)  Protein:  64-74 grams (1.3-1.5 grams/kg)  Fluid:  >/= 1.2 L/day (25 ml/kg)  EDUCATION NEEDS:   No education needs identified at this time  Willey Blade, MS, RD, LDN Pager: [phone] After Hours Pager: [phone]

## 2016-12-08 ENCOUNTER — Encounter
Admission: RE | Admit: 2016-12-08 | Discharge: 2016-12-08 | Disposition: A | Discharge status: Home / Self Care | Payer: Medicare Other | Source: Ambulatory Visit | Attending: Internal Medicine | Admitting: Internal Medicine

## 2016-12-08 LAB — BASIC METABOLIC PANEL
Anion gap: 8 (ref 5–15)
BUN: 21 mg/dL — AB (ref 6–20)
CALCIUM: 8.7 mg/dL — AB (ref 8.9–10.3)
CO2: 24 mmol/L (ref 22–32)
Chloride: 87 mmol/L — ABNORMAL LOW (ref 101–111)
Creatinine, Ser: 1.1 mg/dL — ABNORMAL HIGH (ref 0.44–1.00)
GFR calc Af Amer: 54 mL/min — ABNORMAL LOW (ref >60)
GFR, EST NON AFRICAN AMERICAN: 47 mL/min — AB (ref >60)
GLUCOSE: 110 mg/dL — AB (ref 65–99)
Potassium: 4.6 mmol/L (ref 3.5–5.1)
Sodium: 119 mmol/L — CL (ref 135–145)

## 2016-12-08 LAB — SODIUM
SODIUM: 119 mmol/L — AB (ref 135–145)
Sodium: 123 mmol/L — ABNORMAL LOW (ref 135–145)

## 2016-12-08 LAB — OSMOLALITY, URINE: Osmolality, Ur: 131 mOsm/kg — ABNORMAL LOW (ref 300–900)

## 2016-12-08 LAB — SODIUM, URINE, RANDOM: Sodium, Ur: <10 mmol/L

## 2016-12-08 LAB — OSMOLALITY: Osmolality: 253 mOsm/kg — ABNORMAL LOW (ref 275–295)

## 2016-12-08 MED ORDER — TOLVAPTAN 15 MG PO TABS
15.0000 mg | ORAL_TABLET | ORAL | Status: DC
  Administered 2016-12-08: 15 mg via ORAL
  Filled 2016-12-08 (×2): qty 1

## 2016-12-08 NOTE — Progress Notes (Signed)
MD notified of critical sodium. No new orders given.

## 2016-12-08 NOTE — Consult Note (Signed)
[facility]  CONSULT NOTE    Date: 12/08/2016                  Patient Name:  Cheryl Cannon  MRN: [id number]  DOB: 04/01/1936  Age / Sex: 81 y.o., female         PCP: Glendon Axe, MD                 Service Requesting Consult: Dr. Bary Castilla                 Reason for Consult: Hyponatremia            History of Present Illness: Ms. Cheryl Cannon is a 81 y.o. white female with colon cancer status post colon resection, PE, atrial fibrillation, sick sinus syndrome status post AICD, lung lobectomy, COPD, congestive heart failure, dementia, who was admitted to [facility] on 12/02/2016 for right colon cancer   Dr. Bary Castilla did colon resection on 12/02/2016.   Consulted for hyponatremia. 119 today. Most recent sodium outpatient of 11/06/16 of 132.   Husband at bedside.   Medications: Outpatient medications: Prescriptions Prior to Admission  Medication Sig Dispense Refill Last Dose  . albuterol (PROVENTIL HFA;VENTOLIN HFA) 108 (90 Base) MCG/ACT inhaler Inhale 2 puffs into the lungs every 6 (six) hours as needed for wheezing or shortness of breath. 1 Inhaler 0 12/02/2016 at Unknown time  . ALPRAZolam (XANAX) 0.25 MG tablet Take 0.25 mg by mouth daily as needed for anxiety.    11/30/2016  . apixaban (ELIQUIS) 5 MG TABS tablet Take 1 tablet (5 mg total) by mouth 2 (two) times daily. 60 tablet 0 11/29/2016  . Cholecalciferol (VITAMIN D) 2000 units CAPS Take 2,000 Units by mouth daily.   12/01/2016 at Unknown time  . cyanocobalamin (,VITAMIN B-12,) 1000 MCG/ML injection Inject 1,000 mcg into the muscle every 30 (thirty) days. Injection once a month  Next is due 12-20-2016   11/19/2016  . docusate sodium (COLACE) 100 MG capsule Take 100 mg by mouth daily.    12/01/2016 at Unknown time  . donepezil (ARICEPT) 10 MG tablet Take 1 tablet (10 mg total) by mouth at bedtime. 90 tablet 3 12/01/2016 at Unknown time  . flecainide (TAMBOCOR) 50 MG tablet Take 75 mg by mouth 2 (two) times daily.  Takes 1.5 tablets   12/02/2016 at Unknown time  . furosemide (LASIX) 20 MG tablet Take 1 tablet (20 mg total) by mouth daily. 30 tablet 0 12/01/2016 at Unknown time  . gabapentin (NEURONTIN) 300 MG capsule Take 4 capsules (1,200 mg total) by mouth 2 (two) times daily. 720 capsule 3 12/02/2016 at Unknown time  . memantine (NAMENDA) 10 MG tablet Take 1 tablet daily for 1 week, then increase to 1 tablet twice a day (Patient taking differently: Take 10 mg by mouth daily. ) 180 tablet 3 12/01/2016 at Unknown time  . metoprolol tartrate (LOPRESSOR) 25 MG tablet Take 12.5 mg by mouth at bedtime.    12/01/2016 at Unknown time  . pantoprazole (PROTONIX) 40 MG tablet Take 40 mg by mouth daily.    12/02/2016 at Unknown time  . polyethylene glycol (MIRALAX / GLYCOLAX) packet Take 17 g by mouth daily as needed for mild constipation.    12/01/2016 at Unknown time  . tiotropium (SPIRIVA) 18 MCG inhalation capsule Place 18 mcg into inhaler and inhale daily.    12/02/2016 at Unknown time  . Memantine HCl-Donepezil HCl (NAMZARIC) 7 & 14 & 21 &28 -10 MG C4PK  Take 7 mg by mouth daily. 28 each 0   . neomycin (MYCIFRADIN) 500 MG tablet Take 2 tablets at 6 pm and 2 tablets at 11 pm the evening prior to surgery. 4 tablet 0 Taking  . polyethylene glycol powder (GLYCOLAX/MIRALAX) powder 255 grams one bottle for bowel prep 255 g 0 Taking    Current medications: Current Facility-Administered Medications  Medication Dose Route Frequency Provider Last Rate Last Dose  . acetaminophen (TYLENOL) tablet 650 mg  650 mg Oral Q4H PRN Robert Bellow, MD   650 mg at 12/08/16 0750  . albuterol (PROVENTIL) (2.5 MG/3ML) 0.083% nebulizer solution 3 mL  3 mL Inhalation Q6H PRN Robert Bellow, MD      . ALPRAZolam Duanne Moron) tablet 0.25 mg  0.25 mg Oral Daily PRN Robert Bellow, MD   0.25 mg at 12/06/16 1533  . apixaban (ELIQUIS) tablet 5 mg  5 mg Oral BID Clabe Seal, PA-C   5 mg at 12/08/16 8182  . donepezil (ARICEPT) tablet 10 mg  10 mg  Oral QHS Robert Bellow, MD   10 mg at 12/07/16 2210  . feeding supplement (ENSURE ENLIVE) (ENSURE ENLIVE) liquid 237 mL  237 mL Oral BID BM Hubbard Robinson, MD   237 mL at 12/08/16 0934  . flecainide (TAMBOCOR) tablet 75 mg  75 mg Oral BID Robert Bellow, MD   75 mg at 12/08/16 9937  . furosemide (LASIX) tablet 20 mg  20 mg Oral Daily Clabe Seal, PA-C   20 mg at 12/08/16 1696  . gabapentin (NEURONTIN) capsule 1,200 mg  1,200 mg Oral BID Robert Bellow, MD   1,200 mg at 12/08/16 7893  . HYDROcodone-acetaminophen (NORCO/VICODIN) 5-325 MG per tablet 1 tablet  1 tablet Oral Q4H PRN Robert Bellow, MD      . hydrocortisone (ANUSOL-HC) 2.5 % rectal cream   Rectal TID PRN Robert Bellow, MD      . memantine [facility]) tablet 10 mg  10 mg Oral Daily Robert Bellow, MD   10 mg at 12/08/16 0928  . morphine 2 MG/ML injection 2 mg  2 mg Intravenous Q2H PRN Robert Bellow, MD      . ondansetron (ZOFRAN-ODT) disintegrating tablet 4 mg  4 mg Oral Q6H PRN Robert Bellow, MD       Or  . ondansetron [facility]) injection 4 mg  4 mg Intravenous Q6H PRN Robert Bellow, MD      . pantoprazole (PROTONIX) EC tablet 40 mg  40 mg Oral Daily Robert Bellow, MD   40 mg at 12/08/16 0928  . tiotropium (SPIRIVA) inhalation capsule 18 mcg  18 mcg Inhalation Daily Robert Bellow, MD   18 mcg at 12/07/16 1045  . tolvaptan (SAMSCA) tablet 15 mg  15 mg Oral Q24H Lavonia Dana, MD          Allergies: Allergies  Allergen Reactions  . Gluten Meal Other (See Comments)    GI UPSET/ Celiac disease.  . Parabens Rash      Past Medical History: Past Medical History:  Diagnosis Date  . A-fib ([place])   . Cancer ([place])   . Celiac disease   . CHF (congestive heart failure) ([place])   . COPD (chronic obstructive pulmonary disease) ([place])   . Dementia   . Lung cancer ([place]) 2010   [facility] with chemo/rad/surgery  . Memory deficit   . Neuropathy ([place])   . Pneumonia 2017  . SSS (sick sinus  syndrome) [facility])       Past Surgical History: Past Surgical History:  Procedure Laterality Date  . COLON RESECTION Right 12/02/2016   Procedure: LAPAROSCOPIC RIGHT COLON RESECTION;  Surgeon: Robert Bellow, MD;  Location: [facility] ORS;  Service: General;  Laterality: Right;  . COLONOSCOPY  11/01/2016   in [place] a Dr Gaspar Garbe  . EYE SURGERY     cataract  . LUNG CANCER SURGERY    . LUNG LOBECTOMY Right 09/21/2010  . PACEMAKER INSERTION    . PACEMAKER PLACEMENT  02/16/2011     Family History: Family History  Problem Relation Age of Onset  . Throat cancer Mother   . Cancer Sister      Social History: Social History   Social History  . Marital status: Married    Spouse name: N/A  . Number of children: N/A  . Years of education: N/A   Occupational History  . Not on file.   Social History Main Topics  . Smoking status: Former Smoker    Packs/day: 1.00    Years: 44.00    Quit date: 11/22/1998  . Smokeless tobacco: Never Used  . Alcohol use Yes     Comment: social  . Drug use: No  . Sexual activity: Yes    Birth control/ protection: Other-see comments     Comment: post menopausal   Other Topics Concern  . Not on file   Social History Narrative   ** Merged History Encounter **         Review of Systems: Review of Systems  Constitutional: Positive for malaise/fatigue. Negative for chills, diaphoresis, fever and weight loss.  HENT: Negative.  Negative for congestion, ear discharge, ear pain, hearing loss, nosebleeds, sinus pain, sore throat and tinnitus.   Eyes: Negative for blurred vision, double vision, photophobia, pain, discharge and redness.  Respiratory: Negative.  Negative for cough, hemoptysis, sputum production, shortness of breath, wheezing and stridor.   Cardiovascular: Positive for leg swelling. Negative for chest pain, palpitations, orthopnea, claudication and PND.  Gastrointestinal: Positive for abdominal pain. Negative for blood in stool, constipation, diarrhea,  heartburn, melena, nausea and vomiting.  Genitourinary: Negative.  Negative for dysuria, flank pain, frequency, hematuria and urgency.  Musculoskeletal: Negative.  Negative for back pain, falls, joint pain, myalgias and neck pain.  Skin: Negative.  Negative for itching and rash.  Neurological: Positive for weakness. Negative for dizziness, tingling, tremors, sensory change, speech change, focal weakness, seizures, loss of consciousness and headaches.  Endo/Heme/Allergies: Negative.  Negative for environmental allergies and polydipsia. Does not bruise/bleed easily.  Psychiatric/Behavioral: Negative.  Negative for depression, hallucinations, memory loss, substance abuse and suicidal ideas. The patient is not nervous/anxious and does not have insomnia.     Vital Signs: Blood pressure (!) 94/46, pulse 74, temperature 97.6 F (36.4 C), temperature source Oral, resp. rate 16, height 5' (1.524 m), weight 49 kg (108 lb), SpO2 96 %.  Weight trends: Filed Weights   12/02/16 0609  Weight: 49 kg (108 lb)    Physical Exam: General: NAD, sitting in chair  Head: Normocephalic, atraumatic. Moist oral mucosal membranes  Eyes: Anicteric, PERRL  Neck: Supple, trachea midline  Lungs:  Clear to auscultation  Heart: Regular rate and rhythm  Abdomen:  Midline incision, mild tenderness  Extremities:  1+ peripheral edema.  Neurologic: Nonfocal, moving all four extremities  Skin: No lesions        Lab results: Basic Metabolic Panel:  Recent Labs Lab 12/06/16 0435 12/07/16 0324 12/08/16 0813  NA  --  120* 119*  K  --  4.4 4.6  CL  --  87* 87*  CO2  --  23 24  GLUCOSE  --  99 110*  BUN  --  19 21*  CREATININE 1.01* 1.16* 1.10*  CALCIUM  --  8.3* 8.7*    Liver Function Tests:  Recent Labs Lab 12/07/16 0324  AST 21  ALT 12*  ALKPHOS 94  BILITOT 1.3*  PROT 5.3*  ALBUMIN 3.1*   No results for input(s): LIPASE, AMYLASE in the last 168 hours. No results for input(s): AMMONIA in the  last 168 hours.  CBC:  Recent Labs Lab 12/03/16 0541 12/04/16 0517 12/06/16 0435 12/07/16 0324  WBC 7.6 6.6 5.8 8.1  NEUTROABS  --   --   --  6.7*  HGB 10.6* 10.1* 10.7* 10.9*  HCT 31.2* 29.8* 30.7* 31.1*  MCV 103.9* 101.5* 102.2* 100.8*  PLT 108* 111* 136* 155    Cardiac Enzymes: No results for input(s): CKTOTAL, CKMB, CKMBINDEX, TROPONINI in the last 168 hours.  BNP: Invalid input(s): POCBNP  CBG: No results for input(s): GLUCAP in the last 168 hours.  Microbiology: Results for orders placed or performed during the hospital encounter of 11/29/16  Surgical pcr screen     Status: None   Collection Time: 11/29/16  3:31 PM  Result Value Ref Range Status   MRSA, PCR NEGATIVE NEGATIVE Final   Staphylococcus aureus NEGATIVE NEGATIVE Final    Comment:        The Xpert SA Assay (FDA approved for NASAL specimens in patients over 51 years of age), is one component of a comprehensive surveillance program.  Test performance has been validated by [facility] for patients greater than or equal to 75 year old. It is not intended to diagnose infection nor to guide or monitor treatment.     Coagulation Studies: No results for input(s): LABPROT, INR in the last 72 hours.  Urinalysis: No results for input(s): COLORURINE, LABSPEC, PHURINE, GLUCOSEU, HGBUR, BILIRUBINUR, KETONESUR, PROTEINUR, UROBILINOGEN, NITRITE, LEUKOCYTESUR in the last 72 hours.  Invalid input(s): APPERANCEUR    Imaging: Ct Angio Chest Pe W Or Wo Contrast  Result Date: 12/06/2016 CLINICAL DATA:  Shortness of breath with exertion history of right middle and lower lobe lobectomy and radiation EXAM: CT ANGIOGRAPHY CHEST WITH CONTRAST TECHNIQUE: Multidetector CT imaging of the chest was performed using the standard protocol during bolus administration of intravenous contrast. Multiplanar CT image reconstructions and MIPs were obtained to evaluate the vascular anatomy. CONTRAST:  75 mL Isovue 370 intravenous  COMPARISON:  Chest x-ray 12/06/2016, CT chest 07/17/2016, 11/12/2015 FINDINGS: Cardiovascular: Satisfactory opacification of the pulmonary arteries to the segmental level. No evidence of pulmonary embolism. Norm non aneurysmal aorta. Atherosclerotic calcifications are present. Coronary artery calcifications. Heart size borderline to mildly enlarged. Small pericardial effusion. Mediastinum/Nodes: Thyroid grossly unremarkable. Trachea and mainstem bronchi within normal limits. Mild air distention of the upper esophagus. Lungs/Pleura: Volume loss on the right with prior lobectomy changes noted. Stable moderate emphysematous disease bilaterally. Scattered areas of fibrosis in the right lung, some of which are likely related to post treatment changes. Chronic changes in the right lung base. Scattered hazy densities in the right mid and upper lung could reflect superimposed infection or inflammatory process. Mild ground-glass density in the subpleural left upper lobe. Ill-defined 4 mm ground-glass stellate density in the subpleural lingula. 1.2 x 1.4 cm slightly spiculated nodule in the left lower lobe, series 6, image number 69. Small moderate  persistent effusion. Calcified granuloma in the left lower lobe. No pneumothorax. Upper Abdomen: Marked reflux of contrast into the hepatic venous system. No acute abnormality in the upper abdomen. Musculoskeletal: Degenerative changes of the spine. No acute or suspicious bone lesion. Moderate generalized body wall edema. Review of the MIP images confirms the above findings. IMPRESSION: 1. No convincing evidence for acute pulmonary embolus. 2. Volume loss on the right with prior lobectomy changes. Moderate emphysematous disease bilaterally. Scattered areas of fibrosis in the right lung, stable at the lung base. Scattered hazy densities within the right mid and upper lung as well as the left upper lobe, could reflect acute infectious or inflammatory process. 3. 1.4 cm slightly  spiculated nodule in the left lower lobe, cannot exclude metastatic focus or primary lung carcinoma. Suggest correlation with PET-CT. 4. Small to moderate left pleural effusion. 5. Marked reflux of contrast into the hepatic venous system suggests elevated right heart pressure 6. Moderate generalized body wall edema consistent with anasarca Electronically Signed   By: Donavan Foil M.D.   On: 12/06/2016 20:15      Assessment & Plan: Cheryl Cannon is a 81 y.o. white female with colon cancer status post colon resection, PE, atrial fibrillation, sick sinus syndrome status post AICD, lung lobectomy, COPD, congestive heart failure, dementia, who was admitted to [facility] on 12/02/2016 for right colon cancer   Dr. Bary Castilla did colon resection on 12/02/2016.   1. Hyponatremia: with peripheral edema and history of congestive heart failure.  - fluid restriction - furosemide - start Tolvaptan - check serum and urine osm and urine sodium - serial sodium levels.   2. Chronic kidney disease stage III with proteinuria: IV contrast exposure on 1/15.  - monitor creatinine levels.   3. Hypertension: blood pressure low.  - check albumin level.  - holding metoprolol   LOS: [place], [place] 1/17/201811:46 AM

## 2016-12-08 NOTE — Progress Notes (Signed)
Physical Therapy Treatment Patient Details Name: Cheryl Cannon MRN: [id number] DOB: Jan 17, 1936 Today's Date: 12/08/2016    History of Present Illness Pt underwent laproscopiacally assisted R hemicolectomy with ileotransverse colostomy and is POD#5 at time of evaluation. Pt reports gradualy worsening of strength since surgery    PT Comments    Pt agrees to work with therapy today despite reporting feeling unwell. She is able to perform some limited exercises at EOB followed by short ambulation into hallway. Pt becomes increasingly lethargic and is unable to make it back to room after turning around in hallway during ambulation. She is unsteady with gait intermittently stumbling and requiring assist to utilize walker correctly. Recliner brought out to hallway and pt rolled back to room. Pt left up in recliner. Reports having nausea and RN notified. MD in room with patient after PT session. Pt will benefit from skilled PT services to address deficits in strength, balance, and mobility in order to return to full function at home.    Follow Up Recommendations  SNF     Equipment Recommendations  Rolling walker with 5" wheels    Recommendations for Other Services       Precautions / Restrictions Precautions Precautions: Fall Restrictions Weight Bearing Restrictions: No    Mobility  Bed Mobility Overal bed mobility: Needs Assistance Bed Mobility: Supine to Sit;Sit to Supine     Supine to sit: Mod assist     General bed mobility comments: Pt requires heavy cues for logrolling in bed and transitioning from L sidelying to sitting. HOB elevated and bed rails utilized. Pt encouraged to hug pillow to abdomen to brace and decrease pain. Pt moves slowly and with increased pain  Transfers Overall transfer level: Needs assistance Equipment used: Rolling walker (2 wheeled) Transfers: Sit to/from Stand Sit to Stand: Min assist;+2 physical assistance         General transfer comment: Pt  demonstrates improved transfer strength today. She still requires heavy cues for proper hand placement and sequencing. Requires bilateral UE support in standing due to unsteadiness  Ambulation/Gait Ambulation/Gait assistance: Min assist;+2 physical assistance Ambulation Distance (Feet): 40 Feet Assistive device: Rolling walker (2 wheeled) Gait Pattern/deviations: Decreased step length - right;Decreased step length - left Gait velocity: Decreased Gait velocity interpretation: <1.8 ft/sec, indicative of risk for recurrent falls General Gait Details: Pt ambulates slowly with rolling walker. Cues for proper sequencing and safety. Pt stumbles frequently and has poor safety awareness. Requires continues cues for upright posture as she constantly slumps forward onto walker. VSS on room air during ambulation. Pt becomes increasingly lethargic and is unable to make it back to room after turning around. Recliner brought out to hallway and pt rolled back to room   Stairs            Wheelchair Mobility    Modified Rankin (Stroke Patients Only)       Balance Overall balance assessment: Needs assistance Sitting-balance support: No upper extremity supported Sitting balance-Leahy Scale: Fair     Standing balance support: Bilateral upper extremity supported Standing balance-Leahy Scale: Poor                      Cognition Arousal/Alertness: Awake/alert Behavior During Therapy: WFL for tasks assessed/performed Overall Cognitive Status: Within Functional Limits for tasks assessed                      Exercises General Exercises - Lower Extremity Hip ABduction/ADduction: Strengthening;Both;10 reps;Seated Hip Flexion/Marching: Strengthening;Both;10  reps;Seated    General Comments        Pertinent Vitals/Pain Pain Assessment: 0-10 Pain Score: 6  Pain Location: Abdominal Pain Intervention(s): Monitored during session;Repositioned    Home Living                       Prior Function            PT Goals (current goals can now be found in the care plan section) Acute Rehab PT Goals Patient Stated Goal: Return to prior function at home PT Goal Formulation: With patient/family Time For Goal Achievement: 04/20/17 Potential to Achieve Goals: Good Progress towards PT goals: Progressing toward goals    Frequency    Min 2X/week      PT Plan Current plan remains appropriate    Co-evaluation             End of Session Equipment Utilized During Treatment: Gait belt Activity Tolerance: Patient limited by fatigue;Patient limited by pain Patient left: with call bell/phone within reach;with family/visitor present;in chair;with chair alarm set;Other (comment) (MD with patient)     Time: 7062-3762 PT Time Calculation (min) (ACUTE ONLY): 18 min  Charges:  $Gait Training: 8-22 mins                    G Codes:      Lyndel Safe Huprich PT, DPT   Huprich,Jason 12/08/2016, 12:16 PM

## 2016-12-08 NOTE — NC FL2 (Signed)
[place] LEVEL OF CARE SCREENING TOOL     IDENTIFICATION  Patient Name: Cheryl Cannon Birthdate: 1936/05/03 Sex: female Admission Date (Current Location): 12/02/2016  [place] and [place] Number:  Engineering geologist and Address:  [facility], [address], [place], [place] [address]      Provider Number: [id number]  Attending Physician Name and Address:  Robert Bellow, MD  Relative Name and Phone Number:       Current Level of Care: Hospital Recommended Level of Care: [place] Prior Approval Number:    Date Approved/Denied:   PASRR Number: [id number]  Discharge Plan: SNF    Current Diagnoses: Patient Active Problem List   Diagnosis Date Noted  . Colon cancer ([place]) 12/02/2016  . Malignant neoplasm of ascending colon ([place]) 11/30/2016  . SSS (sick sinus syndrome) ([place]) 11/05/2016  . Elevated troponin 11/05/2016  . UTI (lower urinary tract infection) 07/20/2016  . Pulmonary embolism ([place]) 11/20/2015  . CAP (community acquired pneumonia) 11/12/2015  . Mild chronic obstructive pulmonary disease ([place]) 10/14/2015  . B12 deficiency 10/14/2015  . Pulmonary hypertension 09/25/2015  . Benign essential HTN 09/04/2015  . MI (mitral incompetence) 09/04/2015  . TI (tricuspid incompetence) 09/04/2015  . Insomnia, persistent 07/14/2015  . Mild dementia 07/14/2015  . H/O gastrointestinal disease 06/09/2015  . Hereditary and idiopathic peripheral neuropathy 05/12/2015  . D (diarrhea) 11/25/2014  . Chronic obstructive pulmonary disease ([place]) 10/20/2014  . Sensory neuropathy ([place]) 10/20/2014  . Neuropathy ([place]) 08/16/2014  . Dementia 08/16/2014  . Lung cancer ([place]) 08/16/2014  . Celiac disease 08/16/2014  . Atrial fibrillation ([place]) 08/16/2014  . Breath shortness 06/13/2012  . Encounter for adjustment and management of other part of cardiac pacemaker 05/10/2012  . Non-small cell carcinoma of lung ([place]) 09/08/2011  .  Artificial cardiac pacemaker 09/08/2011  . Malignant pleural effusion 08/20/2010  . Cancer of lung ([place]) 04/24/2010    Orientation RESPIRATION BLADDER Height & Weight     Self, Time, Situation, Place  Normal Incontinent Weight: 108 lb (49 kg) Height:  5' (152.4 cm)  BEHAVIORAL SYMPTOMS/MOOD NEUROLOGICAL BOWEL NUTRITION STATUS   (none)  (none) Incontinent Diet  AMBULATORY STATUS COMMUNICATION OF NEEDS Skin   Supervision Verbally Surgical wounds                       Personal Care Assistance Level of Assistance  Bathing, Dressing Bathing Assistance: Limited assistance   Dressing Assistance: Limited assistance     Functional Limitations Info   (no issues)          SPECIAL CARE FACTORS FREQUENCY  PT (By licensed PT)                    Contractures Contractures Info: Not present    Additional Factors Info  Code Status, Allergies Code Status Info: full Allergies Info: gluten meal, parabens           Current Medications (12/08/2016):  This is the current hospital active medication list Current Facility-Administered Medications  Medication Dose Route Frequency Provider Last Rate Last Dose  . acetaminophen (TYLENOL) tablet 650 mg  650 mg Oral Q4H PRN Robert Bellow, MD   650 mg at 12/08/16 0750  . albuterol (PROVENTIL) (2.5 MG/3ML) 0.083% nebulizer solution 3 mL  3 mL Inhalation Q6H PRN Robert Bellow, MD      . ALPRAZolam Duanne Moron) tablet 0.25 mg  0.25 mg Oral Daily PRN Robert Bellow,  MD   0.25 mg at 12/06/16 1533  . apixaban (ELIQUIS) tablet 5 mg  5 mg Oral BID Clabe Seal, PA-C   5 mg at 12/07/16 2210  . donepezil (ARICEPT) tablet 10 mg  10 mg Oral QHS Robert Bellow, MD   10 mg at 12/07/16 2210  . feeding supplement (ENSURE ENLIVE) (ENSURE ENLIVE) liquid 237 mL  237 mL Oral BID BM Hubbard Robinson, MD   237 mL at 12/07/16 1410  . flecainide (TAMBOCOR) tablet 75 mg  75 mg Oral BID Robert Bellow, MD   75 mg at 12/07/16 2210  . furosemide  (LASIX) tablet 20 mg  20 mg Oral Daily Clabe Seal, PA-C   20 mg at 12/07/16 1146  . gabapentin (NEURONTIN) capsule 1,200 mg  1,200 mg Oral BID Robert Bellow, MD   1,200 mg at 12/07/16 2210  . HYDROcodone-acetaminophen (NORCO/VICODIN) 5-325 MG per tablet 1 tablet  1 tablet Oral Q4H PRN Robert Bellow, MD      . hydrocortisone (ANUSOL-HC) 2.5 % rectal cream   Rectal TID PRN Robert Bellow, MD      . memantine [facility]) tablet 10 mg  10 mg Oral Daily Robert Bellow, MD   10 mg at 12/07/16 1027  . morphine 2 MG/ML injection 2 mg  2 mg Intravenous Q2H PRN Robert Bellow, MD      . ondansetron (ZOFRAN-ODT) disintegrating tablet 4 mg  4 mg Oral Q6H PRN Robert Bellow, MD       Or  . ondansetron [facility]) injection 4 mg  4 mg Intravenous Q6H PRN Robert Bellow, MD      . pantoprazole (PROTONIX) EC tablet 40 mg  40 mg Oral Daily Robert Bellow, MD   40 mg at 12/07/16 1029  . tiotropium (SPIRIVA) inhalation capsule 18 mcg  18 mcg Inhalation Daily Robert Bellow, MD   18 mcg at 12/07/16 1045     Discharge Medications: Please see discharge summary for a list of discharge medications.  Relevant Imaging Results:  Relevant Lab Results:   Additional Information ss: [id number]  Shela Leff, LCSW

## 2016-12-08 NOTE — Progress Notes (Signed)
Febrile, blood pressure and pulse unchanged. Poor oral intake, patient reporting early satiety. Poor exercise tolerance.  Lungs: No clinical evidence of effusion reported on CT, none evident on chest x-ray.  Cardiac: Sinus rhythm.  Abdomen: Nondistended, mild lower abdominal tenderness. Soft, doughy texture to the abdominal wall skin extending into the proximal thigh and gluteal area consistent with anasarca. Wound: Clean.  Labs: Progressive hyponatremia. Renal function stable with estimated GFR 47. Normal potassium.  Case reviewed in detail with cardiology. We'll arrange for nephrology evaluation for attempts to eliminate some free water, relieve the hyponatremia and hopefully improve the patient's general sense of well-being.  Physical therapy notes reviewed. Candidate for rehabilitation when medically stable.

## 2016-12-08 NOTE — Care Management (Signed)
PT has assessed patient and recommends rehab.  CSW facilitating discharge.  NA today 119.  RNCM available for discharge planning if needed.

## 2016-12-08 NOTE — Clinical Social Work Note (Signed)
Clinical Social Work Assessment  Patient Details  Name: Cheryl Cannon MRN: [id number] Date of Birth: 09/30/1936  Date of referral:  12/08/16               Reason for consult:  Facility Placement                Permission sought to share information with:  Facility Contact Representative, Family Supports Permission granted to share information::  Yes, Verbal Permission Granted  Name::        Agency::     Relationship::     Contact Information:     Housing/Transportation Living arrangements for the past 2 months:  Independent Living Facility Source of Information:    Patient Interpreter Needed:  None Criminal Activity/Legal Involvement Pertinent to Current Situation/Hospitalization:  No - Comment as needed Significant Relationships:  Spouse Lives with:  Spouse Do you feel safe going back to the place where you live?  Yes Need for family participation in patient care:  Yes (Comment)  Care giving concerns:  Patient and husband reside at independent living at [facility].   Social Worker assessment / plan:  CSW awaited PT assessment which was completed late yesterday. CSW met with patient and her husband this morning and they are aware of PT recommendations and are in agreement with this. Michelle at [facility] is aware and will reserve a room for him when it is time for discharge.  Employment status:  Retired Insurance information:  Medicare PT Recommendations:  Skilled Nursing Facility Information / Referral to community resources:     Patient/Family's Response to care:  Patient and husband expressed appreciation for CSW assistance.  Patient/Family's Understanding of and Emotional Response to Diagnosis, Current Treatment, and Prognosis:  Patient understands that she may not get the unit at [facility] she wishes but is okay with this. She is wanting to do what is necessary to expedite her recovery.  Emotional Assessment Appearance:  Appears stated  age Attitude/Demeanor/Rapport:   (pleasant and cooperative) Affect (typically observed):  Accepting, Adaptable, Pleasant, Calm Orientation:  Oriented to Self, Oriented to Place, Oriented to Situation Alcohol / Substance use:  Not Applicable Psych involvement (Current and /or in the community):  No (Comment)  Discharge Needs  Concerns to be addressed:  Care Coordination Readmission within the last 30 days:  No Current discharge risk:  None Barriers to Discharge:  No Barriers Identified    , LCSW 12/08/2016, 10:31 AM  

## 2016-12-09 DIAGNOSIS — E440 Moderate protein-calorie malnutrition: Secondary | ICD-10-CM | POA: Insufficient documentation

## 2016-12-09 LAB — COMPREHENSIVE METABOLIC PANEL
ALBUMIN: 3.8 g/dL (ref 3.5–5.0)
ALK PHOS: 111 U/L (ref 38–126)
ALT: 13 U/L — AB (ref 14–54)
AST: 30 U/L (ref 15–41)
Anion gap: 10 (ref 5–15)
BUN: 16 mg/dL (ref 6–20)
CALCIUM: 9.3 mg/dL (ref 8.9–10.3)
CO2: 27 mmol/L (ref 22–32)
CREATININE: 1.05 mg/dL — AB (ref 0.44–1.00)
Chloride: 95 mmol/L — ABNORMAL LOW (ref 101–111)
GFR calc Af Amer: 57 mL/min — ABNORMAL LOW (ref >60)
GFR calc non Af Amer: 50 mL/min — ABNORMAL LOW (ref >60)
GLUCOSE: 97 mg/dL (ref 65–99)
Potassium: 4.2 mmol/L (ref 3.5–5.1)
SODIUM: 132 mmol/L — AB (ref 135–145)
Total Bilirubin: 1.5 mg/dL — ABNORMAL HIGH (ref 0.3–1.2)
Total Protein: 6.7 g/dL (ref 6.5–8.1)

## 2016-12-09 LAB — HEMOGLOBIN AND HEMATOCRIT, BLOOD
HCT: 26.9 % — ABNORMAL LOW (ref 35.0–47.0)
Hemoglobin: 9.3 g/dL — ABNORMAL LOW (ref 12.0–16.0)

## 2016-12-09 LAB — TYPE AND SCREEN
ABO/RH(D): A POS
ANTIBODY SCREEN: NEGATIVE

## 2016-12-09 LAB — SODIUM
SODIUM: 125 mmol/L — AB (ref 135–145)
Sodium: 131 mmol/L — ABNORMAL LOW (ref 135–145)

## 2016-12-09 MED ORDER — METOPROLOL TARTRATE 25 MG/10 ML ORAL SUSPENSION
6.2500 mg | Freq: Two times a day (BID) | ORAL | Status: DC
  Filled 2016-12-09 (×3): qty 2.5

## 2016-12-09 NOTE — Progress Notes (Signed)
Patient's Metoprolol held for low blood pressure, 97/41, pulse 84.  Per Dr. Bary Castilla.

## 2016-12-09 NOTE — Progress Notes (Signed)
[facility]  ROUNDING NOTE   Subjective:   Na 132 - tolvaptan given yesterday.  Husband at bedside.   Objective:  Vital signs in last 24 hours:  Temp:  [97.6 F (36.4 C)-98.4 F (36.9 C)] 98.4 F (36.9 C) (01/18 0747) Pulse Rate:  [65-102] 102 (01/18 0747) Resp:  [16-20] 18 (01/18 0747) BP: (95-118)/(44-53) 118/48 (01/18 0747) SpO2:  [96 %-100 %] 100 % (01/18 0747)  Weight change:  Filed Weights   12/02/16 0609  Weight: 49 kg (108 lb)    Intake/Output: I/O last 3 completed shifts: In: 240 [P.O.:240] Out: 2100 [Urine:2100]   Intake/Output this shift:  Total I/O In: 240 [P.O.:240] Out: 400 [Urine:400]  Physical Exam: General: NAD  Head: Normocephalic, atraumatic. Moist oral mucosal membranes  Eyes: Anicteric, PERRL  Neck: Supple, trachea midline  Lungs:  Clear to auscultation  Heart: Regular rate and rhythm  Abdomen:  Soft, nontender,   Extremities: trace peripheral edema.  Neurologic: Nonfocal, moving all four extremities  Skin: No lesions       Basic Metabolic Panel:  Recent Labs Lab 12/03/16 0541 12/06/16 0435  12/07/16 0324 12/08/16 0813 12/08/16 1238 12/08/16 1711 12/08/16 2329 12/09/16 0541  NA  --   --   < > 120* 119* 119* 123* 125* 132*  K  --   --   --  4.4 4.6  --   --   --  4.2  CL  --   --   --  87* 87*  --   --   --  95*  CO2  --   --   --  23 24  --   --   --  27  GLUCOSE  --   --   --  99 110*  --   --   --  97  BUN  --   --   --  19 21*  --   --   --  16  CREATININE 0.92 1.01*  --  1.16* 1.10*  --   --   --  1.05*  CALCIUM  --   --   --  8.3* 8.7*  --   --   --  9.3  < > = values in this interval not displayed.  Liver Function Tests:  Recent Labs Lab 12/07/16 0324 12/09/16 0541  AST 21 30  ALT 12* 13*  ALKPHOS 94 111  BILITOT 1.3* 1.5*  PROT 5.3* 6.7  ALBUMIN 3.1* 3.8   No results for input(s): LIPASE, AMYLASE in the last 168 hours. No results for input(s): AMMONIA in the last 168 hours.  CBC:  Recent  Labs Lab 12/03/16 0541 12/04/16 0517 12/06/16 0435 12/07/16 0324  WBC 7.6 6.6 5.8 8.1  NEUTROABS  --   --   --  6.7*  HGB 10.6* 10.1* 10.7* 10.9*  HCT 31.2* 29.8* 30.7* 31.1*  MCV 103.9* 101.5* 102.2* 100.8*  PLT 108* 111* 136* 155    Cardiac Enzymes: No results for input(s): CKTOTAL, CKMB, CKMBINDEX, TROPONINI in the last 168 hours.  BNP: Invalid input(s): POCBNP  CBG: No results for input(s): GLUCAP in the last 168 hours.  Microbiology: Results for orders placed or performed during the hospital encounter of 11/29/16  Surgical pcr screen     Status: None   Collection Time: 11/29/16  3:31 PM  Result Value Ref Range Status   MRSA, PCR NEGATIVE NEGATIVE Final   Staphylococcus aureus NEGATIVE NEGATIVE Final    Comment:  The Xpert SA Assay (FDA approved for NASAL specimens in patients over 53 years of age), is one component of a comprehensive surveillance program.  Test performance has been validated by [facility] for patients greater than or equal to 66 year old. It is not intended to diagnose infection nor to guide or monitor treatment.     Coagulation Studies: No results for input(s): LABPROT, INR in the last 72 hours.  Urinalysis: No results for input(s): COLORURINE, LABSPEC, PHURINE, GLUCOSEU, HGBUR, BILIRUBINUR, KETONESUR, PROTEINUR, UROBILINOGEN, NITRITE, LEUKOCYTESUR in the last 72 hours.  Invalid input(s): APPERANCEUR    Imaging: No results found.   Medications:    . donepezil  10 mg Oral QHS  . feeding supplement (ENSURE ENLIVE)  237 mL Oral BID BM  . flecainide  75 mg Oral BID  . furosemide  20 mg Oral Daily  . gabapentin  1,200 mg Oral BID  . memantine  10 mg Oral Daily  . metoprolol tartrate  6.25 mg Oral BID  . pantoprazole  40 mg Oral Daily  . tiotropium  18 mcg Inhalation Daily   acetaminophen, albuterol, ALPRAZolam, HYDROcodone-acetaminophen, hydrocortisone, ondansetron **OR** ondansetron (ZOFRAN) IV  Assessment/ Plan:  Cheryl Cannon is a 81 y.o. white female with colon cancer status post colon resection, PE, atrial fibrillation, sick sinus syndrome status post AICD, lung lobectomy, COPD, congestive heart failure, dementia, who was admitted to [facility] on 12/02/2016 for right colon cancer   Dr. Bary Cannon did colon resection on 12/02/2016.   1. Hyponatremia: with peripheral edema and history of congestive heart failure. Status post tolvaptan on 1/17 - fluid restriction - furosemide  2. Chronic kidney disease stage III with proteinuria: IV contrast exposure on 1/15.  - monitor creatinine levels.   3. Hypertension: blood pressure low.  - check albumin level.  - holding metoprolol   LOS: 7 Cheryl Cannon 1/18/201811:44 AM

## 2016-12-09 NOTE — Progress Notes (Signed)
H/H reviewed. Modest fall during period of fluid mobilization. Will recheck in AM. [place] if much below 9 gm based on cardiac history. Anticoagulants on hold.

## 2016-12-09 NOTE — Progress Notes (Addendum)
Correction to yesterday: Patient afebril.  Afebrile. BP trending up.  Pulse up to 100.  Good u/o. Na trending up. AM values pending. Reports feeling a little better. Lungs: remain clear. ABD: Non-distended, modest,diffuse lower tenderness bilaterally, especially in area of increased soft tissue fluid. Wound: Clean. Anus: old blood, hemorrhoid may have had some bleeding. Rectal: Small amount old bloody fluid. Gluteal area: Marked anasarca that extends into right more so than left thigh. Calves: No discernable edema. IMP/Plan:  Mobilizing free water. Rectal bleeding, anastomotic vs hemorrhoid. Will hold Eliquis for the present. Increased heart rate off metoprolol, restart at 6.25 mg BID.  AM labs pending.  Sodium improved to 132. K+: 4.2. Creat at 1.05, w/ improved eGFR. CBC pending.  

## 2016-12-09 NOTE — Progress Notes (Signed)
[facility] Card[facility]  SUBJECTIVE: The patient remains weak and short of breath with minimal exertion per RN report. She appears more confused today.   Vitals:   12/08/16 1901 12/08/16 2344 12/09/16 0403 12/09/16 0747  BP: (!) 101/44 (!) 105/47 (!) 107/53 (!) 118/48  Pulse: 71 68 76 (!) 102  Resp: '16 18 20 18  '$ Temp: 97.9 F (36.6 C) 97.6 F (36.4 C) 98 F (36.7 C) 98.4 F (36.9 C)  TempSrc: Oral Oral Oral Oral  SpO2: 96% 100% 100% 100%  Weight:      Height:         Intake/Output Summary (Last 24 hours) at 12/09/16 1030 Last data filed at 12/09/16 7116  Gross per 24 hour  Intake                0 ml  Output             2150 ml  Net            -2150 ml      PHYSICAL EXAM  General: Well developed, well nourished, in no acute distress HEENT:  Normocephalic and atramatic Neck:  No JVD.  Lungs: Clear bilaterally to auscultation and percussion. Heart: HRRR . Normal S1 and S2 without gallops or murmurs.  Abdomen: Bowel sounds are positive Msk:  Back normal, moves all 4 extremities. Extremities: Trace BLE edema. No clubbing or cyanosis.   Neuro: Alert, speech clear but mentation somewhat disorganized Psych:  Cooperative, answers some questions appropriately   LABS: Basic Metabolic Panel:  Recent Labs  12/08/16 0813  12/08/16 2329 12/09/16 0541  NA 119*  < > 125* 132*  K 4.6  --   --  4.2  CL 87*  --   --  95*  CO2 24  --   --  27  GLUCOSE 110*  --   --  97  BUN 21*  --   --  16  CREATININE 1.10*  --   --  1.05*  CALCIUM 8.7*  --   --  9.3  < > = values in this interval not displayed. Liver Function Tests:  Recent Labs  12/07/16 0324 12/09/16 0541  AST 21 30  ALT 12* 13*  ALKPHOS 94 111  BILITOT 1.3* 1.5*  PROT 5.3* 6.7  ALBUMIN 3.1* 3.8   No results for input(s): LIPASE, AMYLASE in the last 72 hours. CBC:  Recent Labs  12/07/16 0324  WBC 8.1  NEUTROABS 6.7*  HGB 10.9*  HCT 31.1*  MCV 100.8*  PLT 155   Cardiac Enzymes: No results for input(s):  CKTOTAL, CKMB, CKMBINDEX, TROPONINI in the last 72 hours. BNP: Invalid input(s): POCBNP D-Dimer: No results for input(s): DDIMER in the last 72 hours. Hemoglobin A1C: No results for input(s): HGBA1C in the last 72 hours. Fasting Lipid Panel: No results for input(s): CHOL, HDL, LDLCALC, TRIG, CHOLHDL, LDLDIRECT in the last 72 hours. Thyroid Function Tests: No results for input(s): TSH, T4TOTAL, T3FREE, THYROIDAB in the last 72 hours.  Invalid input(s): FREET3 Anemia Panel: No results for input(s): VITAMINB12, FOLATE, FERRITIN, TIBC, IRON, RETICCTPCT in the last 72 hours.  No results found.     ASSESSMENT AND PLAN:  Active Problems:   Colon cancer ([place])   Malnutrition of moderate degree    1. Dyspnea on exertion, unclear etiology. Clinically no obvious evidence of pulmonary edema. On Lasix. Chest CT negative for PE. 2. Paroxysmal atrial fibrillation, AV pacing, was on Eliquis for stroke prevention, transiently held for rectal  bleeding.  3. Known mildly reduced LVEF 40%. 4. Known severe Tricuspid Regurgitation, unclear significance. 5. Hyponatremia, nearly resolved on Tolvaptan.  Recommendations: 1. Continue current therapy. 2. Continue diuresis 3. Monitor renal function per Nephrology 4. Resume Eliquis when rectal bleeding resolved. 5. Plan to follow-up with Dr. Nehemiah Massed as outpatient.   The patient was seen under the supervision of Dr. Saralyn Pilar.  Paige Horcher, PA-S 12/09/2016 10:30 AM

## 2016-12-09 NOTE — Progress Notes (Signed)
MD aware of rectal bleeding. No new orders given.

## 2016-12-10 LAB — BASIC METABOLIC PANEL
Anion gap: 8 (ref 5–15)
BUN: 13 mg/dL (ref 6–20)
CO2: 29 mmol/L (ref 22–32)
CREATININE: 0.86 mg/dL (ref 0.44–1.00)
Calcium: 8.4 mg/dL — ABNORMAL LOW (ref 8.9–10.3)
Chloride: 97 mmol/L — ABNORMAL LOW (ref 101–111)
GFR calc Af Amer: >60 mL/min (ref >60)
GLUCOSE: 94 mg/dL (ref 65–99)
POTASSIUM: 3.9 mmol/L (ref 3.5–5.1)
Sodium: 134 mmol/L — ABNORMAL LOW (ref 135–145)

## 2016-12-10 LAB — CBC
HCT: 27.1 % — ABNORMAL LOW (ref 35.0–47.0)
Hemoglobin: 9.2 g/dL — ABNORMAL LOW (ref 12.0–16.0)
MCH: 34.6 pg — AB (ref 26.0–34.0)
MCHC: 33.9 g/dL (ref 32.0–36.0)
MCV: 102.2 fL — ABNORMAL HIGH (ref 80.0–100.0)
PLATELETS: 164 10*3/uL (ref 150–440)
RBC: 2.66 MIL/uL — AB (ref 3.80–5.20)
RDW: 17.1 % — ABNORMAL HIGH (ref 11.5–14.5)
WBC: 7 10*3/uL (ref 3.6–11.0)

## 2016-12-10 MED ORDER — ACETAMINOPHEN 325 MG PO TABS: 650.0000 mg | ORAL_TABLET | ORAL | 0 refills | Status: AC | PRN

## 2016-12-10 MED ORDER — ENSURE ENLIVE PO LIQD: 237.0000 mL | Freq: Two times a day (BID) | ORAL | 12 refills | Status: AC

## 2016-12-10 MED ORDER — POTASSIUM CHLORIDE ER 10 MEQ PO TBCR: 10.0000 meq | EXTENDED_RELEASE_TABLET | Freq: Every day | ORAL | 0 refills | Status: AC

## 2016-12-10 MED ORDER — HYDROCODONE-ACETAMINOPHEN 5-325 MG PO TABS: 1.0000 | ORAL_TABLET | ORAL | 0 refills | Status: DC | PRN

## 2016-12-10 MED ORDER — HYDROCORTISONE 2.5 % RE CREA: TOPICAL_CREAM | Freq: Three times a day (TID) | RECTAL | 0 refills | Status: AC | PRN

## 2016-12-10 NOTE — Progress Notes (Signed)
Afebrile. Reports awakening frequently during the night, no complaints, just awakening. Still reports feeling that stomach is swollen. Reports being weak, but recognizes she has eaten little.  Multiple BM's reported, discussed w/ night nurse: little stool, some clots. Urine reported to be clear. BP trending up as is the pulse.  Had considered low dose beta blocker yesterday, but BP drifted back down to the 90's  Reported confused at times during the night. Lungs: Left clear, right decreased (site of bilobectomy).Used inspirex at 700/ Cardio: Clinically, RR. ABD: Non-distended, less "doughy" texture in lower abdomen. Still with lower abdominal tenderness to palpation, but less so than yesterday. Bruise of right lateral abdominal wall unchanged from 4 days ago. Less anasarca in the hips and thighs than yesterday. No distal edema. Had the patient stand at the bedside: no dizziness noted or reported.   AM labs: Na continues to improve, up to 134. Creat down to 0.86 w/ dGFR > 60. CBC: Normal WBC; HGB 9.2, 9.3 yesterday. No suggestion of significant blood loss overnight. (Now 36 hours after last dose of Eloquis.   May be candidate for transfer.Will discuss w/ care management.

## 2016-12-10 NOTE — Plan of Care (Signed)
Problem: Education: Goal: Understanding of discharge needs will improve Outcome: Not Progressing Patient is confused at times.  Needs assistance.  Problem: Health Behavior/Discharge Planning: Goal: Identification of resources available to assist in meeting health care needs will improve Outcome: Not Progressing Needs assistance.

## 2016-12-10 NOTE — Clinical Social Work Note (Signed)
MD to discharge patient today. [facility] is aware and discharge information has been sent. Nurse to call report and patient to transport via EMS. Shela Leff MSW,LCSW [phone]

## 2016-12-10 NOTE — Discharge Summary (Signed)
Physician Discharge Summary  Patient ID: Cheryl Cannon MRN: [id number] DOB/AGE: [birth year] 81 y.o.  Admit date: 12/02/2016 Discharge date: 12/10/2016  Admission Diagnoses:Colon cancer  Discharge Diagnoses:  Active Problems:   Colon cancer (HCC)   Malnutrition of moderate degree Hyponatremia Mild dementia. Anasarca  Discharged Condition: fair  Hospital Course: The patient was admitted the day of surgery when she underwent a laparoscopically assisted right hemicolectomy for a T2, N0 carcinoma the cecum. Her initial postoperative course was unremarkable, but she developed reports of shortness of breath without associated deterioration of vital signs or clinical exam. Chest CT showed no evidence of recurrent pulmonary embolus although an effusion on the left not visible on chest x-ray was noted. During this time the patient was noted to be relatively bradycardic with a pulse of 60 and a modestly low blood pressure in the 03J systolic breath sees baseline 009 systolic). Her beta blocker was held at this time.  The patient developed progressive hyponatremia and was seen by the renal service with resolution.  Pacemaker interrogation showed a change in modes and this was reprogrammed by the cardiology service.  The patient developed rectal bleeding postoperative day 5 after reinstitution of oral anticoagulation therapy ( history of atrial fibrillation). She had a small hemorrhoid but this was not clearly the source. Volume has diminished with cessation of anticoagulation and hemoglobin has remained stable over the last 12 hours.  The patient is able to stand with minimal assistance and has been evaluated by physical therapy and felt to be a candidate for outpatient rehabilitation.  Consults: cardiology and nephrology  Significant Diagnostic Studies: labs: Hyponatremia. Pathology of the resected specimen showed a T2, N0 lesion; 0/16 nodes positive.  Treatments: therapies: PT  Discharge  Exam: Blood pressure 126/63, pulse (!) 110, temperature 97.5 F (36.4 C), temperature source Oral, resp. rate 18, height 5' (1.524 m), weight 123 lb (55.8 kg), SpO2 97 %. Resp: clear to auscultation bilaterally Cardio: regular rate and rhythm and systolic murmur: early systolic 3/6,   at 2nd right intercostal space GI: Mild lower abdominal tenderness without distention. Skin: Skin color, texture, turgor normal. No rashes or lesions Incision/Wound: Healing well.  Disposition:  Skilled nursing  Discharge Instructions    Diet - low sodium heart healthy    Complete by:  As directed    Discharge instructions    Complete by:  As directed    Eat frequent small meals.  Stack between [place] of Nabs/ crackers or ensure.  May shower with assistance when steady.   Increase activity slowly    Complete by:  As directed      Allergies as of 12/10/2016      Reactions   Gluten Meal Other (See Comments)   GI UPSET/ Celiac disease.   Parabens Rash      Medication List    STOP taking these medications   apixaban 5 MG Tabs tablet Commonly known as:  ELIQUIS   docusate sodium 100 MG capsule Commonly known as:  COLACE   metoprolol tartrate 25 MG tablet Commonly known as:  LOPRESSOR   neomycin 500 MG tablet Commonly known as:  MYCIFRADIN   polyethylene glycol packet Commonly known as:  MIRALAX / GLYCOLAX   polyethylene glycol powder powder Commonly known as:  GLYCOLAX/MIRALAX     TAKE these medications   acetaminophen 325 MG tablet Commonly known as:  TYLENOL Take 2 tablets (650 mg total) by mouth every 4 (four) hours as needed for mild pain.   albuterol 108 (  90 Base) MCG/ACT inhaler Commonly known as:  PROVENTIL HFA;VENTOLIN HFA Inhale 2 puffs into the lungs every 6 (six) hours as needed for wheezing or shortness of breath.   ALPRAZolam 0.25 MG tablet Commonly known as:  XANAX Take 0.25 mg by mouth daily as needed for anxiety.   cyanocobalamin 1000 MCG/ML  injection Commonly known as:  (VITAMIN B-12) Inject 1,000 mcg into the muscle every 30 (thirty) days. Injection once a month  Next is due 12-20-2016   donepezil 10 MG tablet Commonly known as:  ARICEPT Take 1 tablet (10 mg total) by mouth at bedtime.   feeding supplement (ENSURE ENLIVE) Liqd Take 237 mLs by mouth 2 (two) times daily between meals.   flecainide 50 MG tablet Commonly known as:  TAMBOCOR Take 75 mg by mouth 2 (two) times daily. Takes 1.5 tablets   furosemide 20 MG tablet Commonly known as:  LASIX Take 1 tablet (20 mg total) by mouth daily.   gabapentin 300 MG capsule Commonly known as:  NEURONTIN Take 4 capsules (1,200 mg total) by mouth 2 (two) times daily.   HYDROcodone-acetaminophen 5-325 MG tablet Commonly known as:  NORCO/VICODIN Take 1 tablet by mouth every 4 (four) hours as needed for moderate pain.   hydrocortisone 2.5 % rectal cream Commonly known as:  ANUSOL-HC Place rectally 3 (three) times daily as needed for hemorrhoids or itching (peri-anal pain.).   memantine 10 MG tablet Commonly known as:  NAMENDA Take 1 tablet daily for 1 week, then increase to 1 tablet twice a day What changed:  how much to take  how to take this  when to take this  additional instructions   Memantine HCl-Donepezil HCl 7 & 14 & 21 &28 -10 MG C4pk Commonly known as:  NAMZARIC Take 7 mg by mouth daily.   pantoprazole 40 MG tablet Commonly known as:  PROTONIX Take 40 mg by mouth daily.   potassium chloride 10 MEQ tablet Commonly known as:  K-DUR Take 1 tablet (10 mEq total) by mouth daily.   tiotropium 18 MCG inhalation capsule Commonly known as:  SPIRIVA Place 18 mcg into inhaler and inhale daily.   Vitamin D 2000 units Caps Take 2,000 Units by mouth daily.        Signed: Robert Bellow 12/10/2016, 9:38 AM

## 2016-12-10 NOTE — Progress Notes (Signed)
[facility] Cardiology  SUBJECTIVE: I'm weak   Vitals:   12/09/16 1955 12/10/16 0436 12/10/16 0632 12/10/16 0746  BP: (!) 99/53 (!) 96/50 (!) 115/55 126/63  Pulse: 81 77 81 (!) 110  Resp: '18 18  18  '$ Temp: 98 F (36.7 C) 97.4 F (36.3 C)  97.5 F (36.4 C)  TempSrc: Oral Oral  Oral  SpO2: 98% 97%  97%  Weight:      Height:         Intake/Output Summary (Last 24 hours) at 12/10/16 0849 Last data filed at 12/10/16 0435  Gross per 24 hour  Intake              240 ml  Output             1649 ml  Net            -1409 ml      PHYSICAL EXAM  General: Well developed, well nourished, in no acute distress HEENT:  Normocephalic and atramatic Neck:  No JVD.  Lungs: Scattered rhonchi Heart: HRRR . Normal S1 and S2 without gallops or murmurs.  Abdomen: Bowel sounds are positive, abdomen soft and non-tender  Msk:  Back normal, normal gait. Normal strength and tone for age. Extremities: No clubbing, cyanosis or edema.   Neuro: Alert and oriented X 3. Psych:  Good affect, responds appropriately   LABS: Basic Metabolic Panel:  Recent Labs  12/09/16 0541 12/09/16 1122 12/10/16 0409  NA 132* 131* 134*  K 4.2  --  3.9  CL 95*  --  97*  CO2 27  --  29  GLUCOSE 97  --  94  BUN 16  --  13  CREATININE 1.05*  --  0.86  CALCIUM 9.3  --  8.4*   Liver Function Tests:  Recent Labs  12/09/16 0541  AST 30  ALT 13*  ALKPHOS 111  BILITOT 1.5*  PROT 6.7  ALBUMIN 3.8   No results for input(s): LIPASE, AMYLASE in the last 72 hours. CBC:  Recent Labs  12/09/16 1722 12/10/16 0409  WBC  --  7.0  HGB 9.3* 9.2*  HCT 26.9* 27.1*  MCV  --  102.2*  PLT  --  164   Cardiac Enzymes: No results for input(s): CKTOTAL, CKMB, CKMBINDEX, TROPONINI in the last 72 hours. BNP: Invalid input(s): POCBNP D-Dimer: No results for input(s): DDIMER in the last 72 hours. Hemoglobin A1C: No results for input(s): HGBA1C in the last 72 hours. Fasting Lipid Panel: No results for input(s): CHOL, HDL,  LDLCALC, TRIG, CHOLHDL, LDLDIRECT in the last 72 hours. Thyroid Function Tests: No results for input(s): TSH, T4TOTAL, T3FREE, THYROIDAB in the last 72 hours.  Invalid input(s): FREET3 Anemia Panel: No results for input(s): VITAMINB12, FOLATE, FERRITIN, TIBC, IRON, RETICCTPCT in the last 72 hours.  No results found.   Echo   TELEMETRY: Normal sinus rhythm:  ASSESSMENT AND PLAN:  Active Problems:   Colon cancer (HCC)   Malnutrition of moderate degree    1. Dyspnea on exertion, unclear etiology, chest x-ray and CT scan does not reveal evidence for significant pulmonary edema, negative for PE 2. Paroxysmal atrial fibrillation, currently AV pacing, on [place] request for stroke prevention, transiently held for rectal bleeding 3. Mild cardiomyopathy, with LV ejection fraction of 40% 4. Known severe tricuspid regurgitation, of uncertain clinical significance 5. Status post right colon resection, failure to thrive 6. Hyponatremia, corrected with tolvaptan  Recommendations  1. Continue current therapy 2. Continue maintenance diuresis 3.  Carefully monitor renal function and electrolytes per Dr. Juleen China 4. Resume Eliquis when rectal bleeding resolved   Isaias Cowman, MD, PhD, [facility] 12/10/2016 8:49 AM

## 2016-12-10 NOTE — Clinical Social Work Placement (Signed)
   CLINICAL SOCIAL WORK PLACEMENT  NOTE  Date:  12/10/2016  Patient Details  Name: Cheryl Cannon MRN: [id number] Date of Birth: Mar 03, 1936  Clinical Social Work is seeking post-discharge placement for this patient at the [place] level of care (*CSW will initial, date and re-position this form in  chart as items are completed):  Yes   Patient/family provided with [facility] Work Department's list of facilities offering this level of care within the geographic area requested by the patient (or if unable, by the patient's family).  Yes   Patient/family informed of their freedom to choose among providers that offer the needed level of care, that participate in Medicare, Medicaid or managed care program needed by the patient, have an available bed and are willing to accept the patient.  Yes   Patient/family informed of [place]'s ownership interest in [facility] and [facility], as well as of the fact that they are under no obligation to receive care at these facilities.  PASRR submitted to EDS on 12/07/16     PASRR number received on 12/07/16     Existing PASRR number confirmed on       FL2 transmitted to all facilities in geographic area requested by pt/family on       FL2 transmitted to all facilities within larger geographic area on       Patient informed that his/her managed care company has contracts with or will negotiate with certain facilities, including the following:            Patient/family informed of bed offers received.  Patient chooses bed at       Physician recommends and patient chooses bed at  [facility])    Patient to be transferred to  [facility] on 12/10/16.  Patient to be transferred to facility by  (EMS)     Patient family notified on 12/10/16 of transfer.  Name of family member notified:   (patient's husband)     PHYSICIAN       Additional Comment:    _______________________________________________ Shela Leff, LCSW 12/10/2016, 11:18 AM

## 2016-12-10 NOTE — Progress Notes (Signed)
Pt d/c to [facility]; report called to [employer], Therapist, sports; d/c instructions reviewed w/ pt and pt family; IV removed, catheter in tact, gauze dressing applied; all pt and pt family questions answered; pt and pt family verbalized that all pt belongings were accounted for; pt left unit via EMS

## 2016-12-13 ENCOUNTER — Encounter: Payer: Self-pay | Admitting: General Surgery

## 2016-12-13 ENCOUNTER — Observation Stay: Payer: Medicare Other

## 2016-12-13 ENCOUNTER — Other Ambulatory Visit: Payer: Self-pay | Admitting: General Surgery

## 2016-12-13 ENCOUNTER — Ambulatory Visit
Admission: RE | Admit: 2016-12-13 | Discharge: 2016-12-13 | Disposition: A | Discharge status: Home / Self Care | Payer: Medicare Other | Source: Ambulatory Visit | Attending: General Surgery | Admitting: General Surgery

## 2016-12-13 ENCOUNTER — Ambulatory Visit (INDEPENDENT_AMBULATORY_CARE_PROVIDER_SITE_OTHER): Payer: Medicare Other | Admitting: General Surgery

## 2016-12-13 ENCOUNTER — Inpatient Hospital Stay
Admission: RE | Admit: 2016-12-13 | Discharge: 2016-12-23 | DRG: 291 | Disposition: E | Payer: Medicare Other | Source: Ambulatory Visit | Attending: Internal Medicine | Admitting: Internal Medicine

## 2016-12-13 VITALS — BP 120/80 | HR 82 | Resp 14 | Ht 62.0 in | Wt 123.0 lb

## 2016-12-13 DIAGNOSIS — G9340 Encephalopathy, unspecified: Secondary | ICD-10-CM | POA: Diagnosis present

## 2016-12-13 DIAGNOSIS — Z6823 Body mass index (BMI) 23.0-23.9, adult: Secondary | ICD-10-CM

## 2016-12-13 DIAGNOSIS — E871 Hypo-osmolality and hyponatremia: Secondary | ICD-10-CM | POA: Diagnosis present

## 2016-12-13 DIAGNOSIS — I482 Chronic atrial fibrillation: Secondary | ICD-10-CM | POA: Diagnosis present

## 2016-12-13 DIAGNOSIS — I5021 Acute systolic (congestive) heart failure: Secondary | ICD-10-CM | POA: Diagnosis present

## 2016-12-13 DIAGNOSIS — Z9049 Acquired absence of other specified parts of digestive tract: Secondary | ICD-10-CM

## 2016-12-13 DIAGNOSIS — R0602 Shortness of breath: Secondary | ICD-10-CM | POA: Diagnosis present

## 2016-12-13 DIAGNOSIS — E43 Unspecified severe protein-calorie malnutrition: Secondary | ICD-10-CM | POA: Diagnosis present

## 2016-12-13 DIAGNOSIS — Z923 Personal history of irradiation: Secondary | ICD-10-CM

## 2016-12-13 DIAGNOSIS — Z66 Do not resuscitate: Secondary | ICD-10-CM | POA: Diagnosis present

## 2016-12-13 DIAGNOSIS — I959 Hypotension, unspecified: Secondary | ICD-10-CM | POA: Diagnosis present

## 2016-12-13 DIAGNOSIS — R6881 Early satiety: Secondary | ICD-10-CM | POA: Insufficient documentation

## 2016-12-13 DIAGNOSIS — R05 Cough: Secondary | ICD-10-CM

## 2016-12-13 DIAGNOSIS — L8990 Pressure ulcer of unspecified site, unspecified stage: Secondary | ICD-10-CM | POA: Insufficient documentation

## 2016-12-13 DIAGNOSIS — R059 Cough, unspecified: Secondary | ICD-10-CM

## 2016-12-13 DIAGNOSIS — Z85118 Personal history of other malignant neoplasm of bronchus and lung: Secondary | ICD-10-CM

## 2016-12-13 DIAGNOSIS — Z515 Encounter for palliative care: Secondary | ICD-10-CM | POA: Diagnosis present

## 2016-12-13 DIAGNOSIS — Z85038 Personal history of other malignant neoplasm of large intestine: Secondary | ICD-10-CM

## 2016-12-13 DIAGNOSIS — J441 Chronic obstructive pulmonary disease with (acute) exacerbation: Secondary | ICD-10-CM | POA: Diagnosis present

## 2016-12-13 DIAGNOSIS — Z95810 Presence of automatic (implantable) cardiac defibrillator: Secondary | ICD-10-CM

## 2016-12-13 DIAGNOSIS — Z86711 Personal history of pulmonary embolism: Secondary | ICD-10-CM

## 2016-12-13 DIAGNOSIS — I509 Heart failure, unspecified: Secondary | ICD-10-CM

## 2016-12-13 DIAGNOSIS — Z9221 Personal history of antineoplastic chemotherapy: Secondary | ICD-10-CM

## 2016-12-13 DIAGNOSIS — I110 Hypertensive heart disease with heart failure: Principal | ICD-10-CM | POA: Diagnosis present

## 2016-12-13 DIAGNOSIS — F0390 Unspecified dementia without behavioral disturbance: Secondary | ICD-10-CM | POA: Diagnosis present

## 2016-12-13 DIAGNOSIS — K117 Disturbances of salivary secretion: Secondary | ICD-10-CM

## 2016-12-13 DIAGNOSIS — R627 Adult failure to thrive: Secondary | ICD-10-CM | POA: Diagnosis present

## 2016-12-13 DIAGNOSIS — N179 Acute kidney failure, unspecified: Secondary | ICD-10-CM | POA: Diagnosis present

## 2016-12-13 DIAGNOSIS — Z87891 Personal history of nicotine dependence: Secondary | ICD-10-CM

## 2016-12-13 LAB — CBC
HCT: 27.4 % — ABNORMAL LOW (ref 35.0–47.0)
Hemoglobin: 9.4 g/dL — ABNORMAL LOW (ref 12.0–16.0)
MCH: 34.2 pg — ABNORMAL HIGH (ref 26.0–34.0)
MCHC: 34.2 g/dL (ref 32.0–36.0)
MCV: 100.1 fL — AB (ref 80.0–100.0)
PLATELETS: 182 10*3/uL (ref 150–440)
RBC: 2.74 MIL/uL — ABNORMAL LOW (ref 3.80–5.20)
RDW: 16.3 % — AB (ref 11.5–14.5)
WBC: 13.7 10*3/uL — AB (ref 3.6–11.0)

## 2016-12-13 LAB — BASIC METABOLIC PANEL
ANION GAP: 10 (ref 5–15)
BUN: 15 mg/dL (ref 6–20)
CO2: 28 mmol/L (ref 22–32)
CREATININE: 0.63 mg/dL (ref 0.44–1.00)
Calcium: 8.3 mg/dL — ABNORMAL LOW (ref 8.9–10.3)
Chloride: 88 mmol/L — ABNORMAL LOW (ref 101–111)
GFR calc Af Amer: >60 mL/min (ref >60)
GLUCOSE: 117 mg/dL — AB (ref 65–99)
Potassium: 4.2 mmol/L (ref 3.5–5.1)
Sodium: 126 mmol/L — ABNORMAL LOW (ref 135–145)

## 2016-12-13 LAB — BRAIN NATRIURETIC PEPTIDE: B NATRIURETIC PEPTIDE 5: 1069 pg/mL — AB (ref 0.0–100.0)

## 2016-12-13 MED ORDER — ALPRAZOLAM 0.5 MG PO TABS
0.2500 mg | ORAL_TABLET | Freq: Every day | ORAL | Status: DC | PRN
  Administered 2016-12-13 – 2016-12-14 (×2): 0.25 mg via ORAL
  Filled 2016-12-13 (×2): qty 1

## 2016-12-13 MED ORDER — IPRATROPIUM-ALBUTEROL 0.5-2.5 (3) MG/3ML IN SOLN
3.0000 mL | RESPIRATORY_TRACT | Status: DC
  Administered 2016-12-13 – 2016-12-14 (×4): 3 mL via RESPIRATORY_TRACT
  Filled 2016-12-13 (×2): qty 3

## 2016-12-13 MED ORDER — SODIUM CHLORIDE 0.9% FLUSH: 3.0000 mL | Freq: Two times a day (BID) | INTRAVENOUS | Status: AC

## 2016-12-13 MED ORDER — HYDROCORTISONE 2.5 % RE CREA
TOPICAL_CREAM | Freq: Three times a day (TID) | RECTAL | Status: DC | PRN
  Filled 2016-12-13: qty 28.35

## 2016-12-13 MED ORDER — PANTOPRAZOLE SODIUM 40 MG PO TBEC
40.0000 mg | DELAYED_RELEASE_TABLET | Freq: Every day | ORAL | Status: DC
  Administered 2016-12-14 – 2016-12-15 (×2): 40 mg via ORAL
  Filled 2016-12-13 (×2): qty 1

## 2016-12-13 MED ORDER — ALBUTEROL SULFATE (2.5 MG/3ML) 0.083% IN NEBU
2.5000 mg | INHALATION_SOLUTION | Freq: Once | RESPIRATORY_TRACT | Status: AC
  Administered 2016-12-13: 2.5 mg via RESPIRATORY_TRACT
  Filled 2016-12-13: qty 3

## 2016-12-13 MED ORDER — METOPROLOL TARTRATE 25 MG PO TABS
12.5000 mg | ORAL_TABLET | Freq: Once | ORAL | Status: AC
  Administered 2016-12-13: 12.5 mg via ORAL
  Filled 2016-12-13: qty 0.5

## 2016-12-13 MED ORDER — MEMANTINE HCL 10 MG PO TABS
10.0000 mg | ORAL_TABLET | Freq: Two times a day (BID) | ORAL | Status: DC
Start: 2016-12-13 — End: 2016-12-13

## 2016-12-13 MED ORDER — SODIUM CHLORIDE FLUSH 0.9 % IV SOLN
INTRAVENOUS | Status: AC
  Administered 2016-12-13: 22:00:00 3 mL
  Filled 2016-12-13: qty 10

## 2016-12-13 MED ORDER — ACETAMINOPHEN 650 MG RE SUPP: 650.0000 mg | Freq: Four times a day (QID) | RECTAL | Status: DC | PRN

## 2016-12-13 MED ORDER — TIOTROPIUM BROMIDE MONOHYDRATE 18 MCG IN CAPS
18.0000 ug | ORAL_CAPSULE | Freq: Every day | RESPIRATORY_TRACT | Status: DC
  Administered 2016-12-14: 18 ug via RESPIRATORY_TRACT
  Filled 2016-12-13: qty 5

## 2016-12-13 MED ORDER — MEMANTINE HCL-DONEPEZIL HCL 7 & 14 & 21 &28 -10 MG PO C4PK: 7.0000 mg | EXTENDED_RELEASE_CAPSULE | Freq: Every day | ORAL | Status: DC

## 2016-12-13 MED ORDER — ONDANSETRON HCL 4 MG/2ML IJ SOLN
4.0000 mg | Freq: Once | INTRAMUSCULAR | Status: AC
  Administered 2016-12-13: 4 mg via INTRAVENOUS

## 2016-12-13 MED ORDER — METHYLPREDNISOLONE SODIUM SUCC 125 MG IJ SOLR
60.0000 mg | Freq: Four times a day (QID) | INTRAMUSCULAR | Status: DC
  Administered 2016-12-13 – 2016-12-14 (×3): 60 mg via INTRAVENOUS
  Filled 2016-12-13 (×3): qty 2

## 2016-12-13 MED ORDER — SODIUM CHLORIDE 0.9 % IV SOLN
INTRAVENOUS | Status: AC
  Administered 2016-12-13: 14:00:00 via INTRAVENOUS

## 2016-12-13 MED ORDER — ONDANSETRON HCL 4 MG/2ML IJ SOLN
INTRAMUSCULAR | Status: AC
  Administered 2016-12-13: 4 mg via INTRAVENOUS
  Filled 2016-12-13: qty 2

## 2016-12-13 MED ORDER — SODIUM CHLORIDE 0.9% FLUSH: 3.0000 mL | INTRAVENOUS | Status: AC | PRN

## 2016-12-13 MED ORDER — GABAPENTIN 400 MG PO CAPS
1200.0000 mg | ORAL_CAPSULE | Freq: Two times a day (BID) | ORAL | Status: DC
  Administered 2016-12-14 – 2016-12-15 (×3): 1200 mg via ORAL
  Filled 2016-12-13 (×5): qty 3

## 2016-12-13 MED ORDER — METOPROLOL TARTRATE 25 MG PO TABS
ORAL_TABLET | ORAL | Status: AC
  Administered 2016-12-13: 12.5 mg via ORAL
  Filled 2016-12-13: qty 1

## 2016-12-13 MED ORDER — ACETAMINOPHEN 325 MG PO TABS
650.0000 mg | ORAL_TABLET | Freq: Four times a day (QID) | ORAL | Status: DC | PRN
  Administered 2016-12-13 – 2016-12-14 (×2): 650 mg via ORAL
  Filled 2016-12-13 (×2): qty 2

## 2016-12-13 MED ORDER — CYANOCOBALAMIN 1000 MCG/ML IJ SOLN: 1000.0000 ug | INTRAMUSCULAR | Status: DC

## 2016-12-13 MED ORDER — ONDANSETRON HCL 4 MG PO TABS: 4.0000 mg | ORAL_TABLET | Freq: Four times a day (QID) | ORAL | Status: DC | PRN

## 2016-12-13 MED ORDER — IPRATROPIUM-ALBUTEROL 0.5-2.5 (3) MG/3ML IN SOLN
RESPIRATORY_TRACT | Status: AC
  Administered 2016-12-13: 3 mL via RESPIRATORY_TRACT
  Filled 2016-12-13: qty 3

## 2016-12-13 MED ORDER — SODIUM CHLORIDE FLUSH 0.9 % IV SOLN
INTRAVENOUS | Status: AC
  Filled 2016-12-13: qty 10

## 2016-12-13 MED ORDER — ONDANSETRON HCL 4 MG/2ML IJ SOLN: 4.0000 mg | Freq: Four times a day (QID) | INTRAMUSCULAR | Status: DC | PRN

## 2016-12-13 MED ORDER — SODIUM CHLORIDE 0.9 % IV SOLN: 250.0000 mL | INTRAVENOUS | Status: AC | PRN

## 2016-12-13 MED ORDER — ENSURE ENLIVE PO LIQD
237.0000 mL | Freq: Two times a day (BID) | ORAL | Status: DC
  Administered 2016-12-14: 12:00:00 237 mL via ORAL

## 2016-12-13 MED ORDER — DONEPEZIL HCL 5 MG PO TABS
10.0000 mg | ORAL_TABLET | Freq: Every day | ORAL | Status: DC
  Administered 2016-12-14: 10 mg via ORAL
  Filled 2016-12-13 (×2): qty 1
  Filled 2016-12-13: qty 2

## 2016-12-13 MED ORDER — MEMANTINE HCL 10 MG PO TABS
10.0000 mg | ORAL_TABLET | Freq: Every day | ORAL | Status: DC
  Administered 2016-12-14: 22:00:00 10 mg via ORAL
  Filled 2016-12-13 (×3): qty 1

## 2016-12-13 NOTE — Progress Notes (Unsigned)
Patient hypotensive after metoprolol, 12.5 mg po. HR down to baseline (60's). Nauseated. Labs: HGB stable, no evidence of ongoing GI bleeding. Can likely restart Eliquis. Anasarca of hips/ thighs not quite as prominent as last week. Weight still up 7 pounds over preop. CXR results reviewed. Effusion smaller. (Not seen on original [facility] of January 15th, noted on CT of same date.  KUB not completed on presentation to hospital as requested. Will place in observation and ask [facility] to assess.

## 2016-12-13 NOTE — Progress Notes (Signed)
Patient ID: Cheryl Cannon, female   DOB: 11/06/1936, 81 y.o.   MRN: [id number]  Chief Complaint  Patient presents with  . Follow-up    hemicolectomy     HPI Cheryl Cannon is a 81 y.o. female is here today for a follow up for a right hemicolectomy and colostomy done on 12/02/16. The patient was discharged to skilled nursing on postoperative day 8. Her recovery was compromised by rectal bleeding after reinitiation of Elequis therapy on postoperative day 4. Hemoglobin was stable at discharge. Her clinical exam did not suggest obstruction and was felt that she would benefit from and out of the hospital recovery time.  Patient states she is feeling bloated since she had the surgery and she has no appetite. She feels full constantly, she is not eating. Reports it takes some effort to move her bowels. She is drinking < 1 Ensure drink / day.   Her beta blocker was held at discharge because her blood pressure was running in the high 90 level.  Blood pressure today is up as is her heart rate.  The patient has developed a nonproductive cough.  Her husband had questions about resuming her oral anticoagulation therapy. This decision will be put on hold pending today's laboratory studies. HPI  Past Medical History:  Diagnosis Date  . A-fib ([place])   . Cancer ([place])   . Celiac disease   . CHF (congestive heart failure) ([place])   . COPD (chronic obstructive pulmonary disease) ([place])   . Dementia   . Lung cancer ([place]) 2010   [facility] with chemo/rad/surgery  . Memory deficit   . Neuropathy ([place])   . Pneumonia 2017  . SSS (sick sinus syndrome) [facility])     Past Surgical History:  Procedure Laterality Date  . COLON RESECTION Right 12/02/2016   Procedure: LAPAROSCOPIC RIGHT COLON RESECTION;  Surgeon: Robert Bellow, MD;  Location: [facility] ORS;  Service: General;  Laterality: Right;  . COLONOSCOPY  11/01/2016   in [place] a Dr Gaspar Garbe  . EYE SURGERY     cataract  . LUNG CANCER SURGERY    . LUNG LOBECTOMY Right  09/21/2010  . PACEMAKER INSERTION    . PACEMAKER PLACEMENT  02/16/2011    Family History  Problem Relation Age of Onset  . Throat cancer Mother   . Cancer Sister     Social History Social History  Substance Use Topics  . Smoking status: Former Smoker    Packs/day: 1.00    Years: 44.00    Quit date: 11/22/1998  . Smokeless tobacco: Never Used  . Alcohol use Yes     Comment: social    Allergies  Allergen Reactions  . Gluten Meal Other (See Comments)    GI UPSET/ Celiac disease.  . Parabens Rash    Current Outpatient Prescriptions  Medication Sig Dispense Refill  . acetaminophen (TYLENOL) 325 MG tablet Take 2 tablets (650 mg total) by mouth every 4 (four) hours as needed for mild pain. 100 tablet 0  . albuterol (PROVENTIL HFA;VENTOLIN HFA) 108 (90 Base) MCG/ACT inhaler Inhale 2 puffs into the lungs every 6 (six) hours as needed for wheezing or shortness of breath. 1 Inhaler 0  . ALPRAZolam (XANAX) 0.25 MG tablet Take 0.25 mg by mouth daily as needed for anxiety.     . Cholecalciferol (VITAMIN D) 2000 units CAPS Take 2,000 Units by mouth daily.    . cyanocobalamin (,VITAMIN B-12,) 1000 MCG/ML injection Inject 1,000 mcg into the muscle every 30 (thirty)  days. Injection once a month  Next is due 12-20-2016    . donepezil (ARICEPT) 10 MG tablet Take 1 tablet (10 mg total) by mouth at bedtime. 90 tablet 3  . feeding supplement, ENSURE ENLIVE, (ENSURE ENLIVE) LIQD Take 237 mLs by mouth 2 (two) times daily between meals. 237 mL 12  . flecainide (TAMBOCOR) 50 MG tablet Take 75 mg by mouth 2 (two) times daily. Takes 1.5 tablets    . furosemide (LASIX) 20 MG tablet Take 1 tablet (20 mg total) by mouth daily. 30 tablet 0  . gabapentin (NEURONTIN) 300 MG capsule Take 4 capsules (1,200 mg total) by mouth 2 (two) times daily. 720 capsule 3  . hydrocortisone (ANUSOL-HC) 2.5 % rectal cream Place rectally 3 (three) times daily as needed for hemorrhoids or itching (peri-anal pain.). 30 g 0  .  memantine (NAMENDA) 10 MG tablet Take 1 tablet daily for 1 week, then increase to 1 tablet twice a day (Patient taking differently: Take 10 mg by mouth daily. ) 180 tablet 3  . Memantine HCl-Donepezil HCl (NAMZARIC) 7 & 14 & 21 &28 -10 MG C4PK Take 7 mg by mouth daily. 28 each 0  . pantoprazole (PROTONIX) 40 MG tablet Take 40 mg by mouth daily.     . potassium chloride (K-DUR) 10 MEQ tablet Take 1 tablet (10 mEq total) by mouth daily. 30 tablet 0  . tiotropium (SPIRIVA) 18 MCG inhalation capsule Place 18 mcg into inhaler and inhale daily.      No current facility-administered medications for this visit.     Review of Systems Review of Systems  Constitutional: Positive for appetite change (loss).  Respiratory: Negative.   Cardiovascular: Negative.   Gastrointestinal: Positive for abdominal distention.    Blood pressure 120/80, pulse 82, resp. rate 14, height '5\' 2"'$  (1.575 m), weight 123 lb (55.8 kg), SpO2 97 %.  Physical Exam Physical Exam  Constitutional: She is oriented to person, place, and time. She appears well-developed and well-nourished.  Cardiovascular: Normal rate, regular rhythm and normal heart sounds.   Mild bilateral, symmetrical lower extremity edema, new from discharged 3 days ago.  Pulmonary/Chest: Effort normal and breath sounds normal.      Abdominal: Soft. Bowel sounds are normal.    Some fluid acculmulation  Neurological: She is alert and oriented to person, place, and time.  Skin: Skin is warm and dry.    Data Reviewed Plain films of chest and abdomen reviewed. No evidence of SBO, SB dilatation. CXR reports decreased left pleural effusion. (Not noted on PCXR of one week ago).   Assessment    Tachycardia, possible volume depletion.     Plan    Labs and plain films of the chest and abdomen. Check CBC and basic metabolic panel.  IV fluids.      This information has been scribed by Verlene Mayer, CMA    Robert Bellow 12/16/2016, 12:05  PM

## 2016-12-13 NOTE — Progress Notes (Signed)
Assisted OOB x2 for small amount of gelatinous brown stool with small amount of bright red blood.  Reported to Dr. Bary Castilla

## 2016-12-13 NOTE — Progress Notes (Signed)
Patient with congested cough, rhonchi bilat. Complaints of shortness of breath, discussed with Dr. Bary Castilla.  Albuterol Neb treatment given with some relief. O2 sat 98%

## 2016-12-13 NOTE — Progress Notes (Signed)
Upon completion of 1 liter of NS patient states that she feels worn out, still complains of some shortness of breath.  V/S as recorded.  Reported to Dr. Bary Castilla, will admit for observation.

## 2016-12-13 NOTE — Progress Notes (Signed)
   11/30/2016 2000  Clinical Encounter Type  Visited With Patient  Visit Type Spiritual support;Social support;Psychological support  Referral From Patient;Nurse  Spiritual Encounters  Spiritual Needs Prayer  [place] responding to spiritual care request for prayer.  Patient interested in prayer and for [facility] to come back in AM. 8:41 PM Gwynn Burly

## 2016-12-13 NOTE — H&P (Signed)
[place] [facility] [place] NAME: Cheryl Cannon    MR#:  [id number]  DATE OF BIRTH:  13-Dec-1935  DATE OF ADMISSION:  12/18/2016  PRIMARY CARE PHYSICIAN: Glendon Axe, MD   REQUESTING/REFERRING PHYSICIAN:DR.Byrnette  CHIEF COMPLAINT:  No chief complaint on file.   HISTORY OF PRESENT ILLNESS:  Cheryl Cannon  is a 81 y.o. female with a known history of Atrial fibrillation, COPD, dementia, recent right hemicolectomy, discharged on 19th of this month to rehabilitation by Dr. Fleet Contras.. Patient was here from January 11 to January 19, patient had  righthemicolectomy for colon cancer that time. Patient to cardiac compromised by rectal bleeding after any reinitiation of a liquid was on postop day 4. Patient went to follow up with him. Found to be elevated up to 100.received metoprolol 12.5 mg by mouth which dropped her blood pressure and also heart rate. BP 09/32 systolic and heart rate 59. Concerning this I was asked to see the patient. Patient having lots of cough, also has pedal edema and anasarca, since the discharge from rehabilitation not eating much. During the last discharge metoprolol was held because of soft blood pressure. PAST MEDICAL HISTORY:   Past Medical History:  Diagnosis Date  . A-fib ([place])   . Cancer ([place])   . Celiac disease   . CHF (congestive heart failure) ([place])   . COPD (chronic obstructive pulmonary disease) ([place])   . Dementia   . Lung cancer ([place]) 2010   [facility] with chemo/rad/surgery  . Memory deficit   . Neuropathy ([place])   . Pneumonia 2017  . SSS (sick sinus syndrome) ([place])     PAST SURGICAL HISTOIRY:   Past Surgical History:  Procedure Laterality Date  . COLON RESECTION Right 12/02/2016   Procedure: LAPAROSCOPIC RIGHT COLON RESECTION;  Surgeon: Robert Bellow, MD;  Location: [facility] ORS;  Service: General;  Laterality: Right;  . COLONOSCOPY  11/01/2016   in [place] a Dr Gaspar Garbe  . EYE SURGERY     cataract  . LUNG CANCER  SURGERY    . LUNG LOBECTOMY Right 09/21/2010  . PACEMAKER INSERTION    . PACEMAKER PLACEMENT  02/16/2011    SOCIAL HISTORY:   Social History  Substance Use Topics  . Smoking status: Former Smoker    Packs/day: 1.00    Years: 44.00    Quit date: 11/22/1998  . Smokeless tobacco: Never Used  . Alcohol use Yes     Comment: social    FAMILY HISTORY:   Family History  Problem Relation Age of Onset  . Throat cancer Mother   . Cancer Sister     DRUG ALLERGIES:   Allergies  Allergen Reactions  . Gluten Meal Other (See Comments)    GI UPSET/ Celiac disease.  . Parabens Rash    REVIEW OF SYSTEMS:  CONSTITUTIONAL: No fever, fatigue or weakness.  EYES: No blurred or double vision.  EARS, NOSE, AND THROAT: No tinnitus or ear pain.  RESPIRATORY: No cough, shortness of breath, wheezing or hemoptysis.  CARDIOVASCULAR: No chest pain, orthopnea, edema.  GASTROINTESTINAL: No nausea, vomiting, diarrhea or abdominal pain.  GENITOURINARY: No dysuria, hematuria.  ENDOCRINE: No polyuria, nocturia,  HEMATOLOGY: No anemia, easy bruising or bleeding SKIN: No rash or lesion. MUSCULOSKELETAL: No joint pain or arthritis.   NEUROLOGIC: No tingling, numbness, weakness.  PSYCHIATRY: No anxiety or depression.   MEDICATIONS AT HOME:   Prior to Admission medications   Medication Sig Start Date End Date Taking? Authorizing Provider  acetaminophen (TYLENOL) 325 MG tablet Take 2 tablets (650 mg total) by mouth every 4 (four) hours as needed for mild pain. 12/10/16   Robert Bellow, MD  albuterol (PROVENTIL HFA;VENTOLIN HFA) 108 (90 Base) MCG/ACT inhaler Inhale 2 puffs into the lungs every 6 (six) hours as needed for wheezing or shortness of breath. 07/17/16   Orbie Pyo, MD  ALPRAZolam Duanne Moron) 0.25 MG tablet Take 0.25 mg by mouth daily as needed for anxiety.     Historical Provider, MD  Cholecalciferol (VITAMIN D) 2000 units CAPS Take 2,000 Units by mouth daily.    Historical Provider,  MD  cyanocobalamin (,VITAMIN B-12,) 1000 MCG/ML injection Inject 1,000 mcg into the muscle every 30 (thirty) days. Injection once a month  Next is due 12-20-2016    Historical Provider, MD  donepezil (ARICEPT) 10 MG tablet Take 1 tablet (10 mg total) by mouth at bedtime. 09/14/16   Cameron Sprang, MD  feeding supplement, ENSURE ENLIVE, (ENSURE ENLIVE) LIQD Take 237 mLs by mouth 2 (two) times daily between meals. 12/10/16   Robert Bellow, MD  flecainide (TAMBOCOR) 50 MG tablet Take 75 mg by mouth 2 (two) times daily. Takes 1.5 tablets    Historical Provider, MD  furosemide (LASIX) 20 MG tablet Take 1 tablet (20 mg total) by mouth daily. 11/17/15   Aldean Jewett, MD  gabapentin (NEURONTIN) 300 MG capsule Take 4 capsules (1,200 mg total) by mouth 2 (two) times daily. 09/14/16   Cameron Sprang, MD  hydrocortisone (ANUSOL-HC) 2.5 % rectal cream Place rectally 3 (three) times daily as needed for hemorrhoids or itching (peri-anal pain.). 12/10/16   Robert Bellow, MD  memantine (NAMENDA) 10 MG tablet Take 1 tablet daily for 1 week, then increase to 1 tablet twice a day Patient taking differently: Take 10 mg by mouth daily.  09/14/16   Cameron Sprang, MD  Memantine HCl-Donepezil HCl [facility]) 7 & 14 & 21 &28 -10 MG C4PK Take 7 mg by mouth daily. 11/26/16   Cameron Sprang, MD  pantoprazole (PROTONIX) 40 MG tablet Take 40 mg by mouth daily.     Historical Provider, MD  potassium chloride (K-DUR) 10 MEQ tablet Take 1 tablet (10 mEq total) by mouth daily. 12/10/16   Robert Bellow, MD  tiotropium (SPIRIVA) 18 MCG inhalation capsule Place 18 mcg into inhaler and inhale daily.     Historical Provider, MD      VITAL SIGNS:  Blood pressure (!) 92/55, pulse 61, temperature (!) 96.6 F (35.9 C), temperature source Oral, resp. rate 18, SpO2 98 %.  PHYSICAL EXAMINATION:  GENERAL:  81 y.o.-year-old patient lying in the bed with no acute distress.  EYES: Pupils equal, round, reactive to light and  accommodation. No scleral icterus. Extraocular muscles intact.  HEENT: Head atraumatic, normocephalic. Oropharynx and nasopharynx clear.  NECK:  Supple, no jugular venous distention. No thyroid enlargement, no tenderness.  LUNGS: Normal breath sounds bilaterally, no wheezing, rales,rhonchi or crepitation. No use of accessory muscles of respiration.  CARDIOVASCULAR: S1, S2 normal. No murmurs, rubs, or gallops.  ABDOMEN: Soft, nontender, nondistended. Bowel sounds present. No organomegaly or mass.  EXTREMITIES: No pedal edema, cyanosis, or clubbing.  NEUROLOGIC: Cranial nerves II through XII are intact. Muscle strength 5/5 in all extremities. Sensation intact. Gait not checked.  PSYCHIATRIC: The patient is alert and oriented x 3.  SKIN: No obvious rash, lesion, or ulcer.   LABORATORY PANEL:   CBC  Recent Labs Lab 12/17/2016 1301  WBC 13.7*  HGB 9.4*  HCT 27.4*  PLT 182   ------------------------------------------------------------------------------------------------------------------  Chemistries   Recent Labs Lab 12/09/16 0541  12/14/2016 1301  NA 132*  < > 126*  K 4.2  < > 4.2  CL 95*  < > 88*  CO2 27  < > 28  GLUCOSE 97  < > 117*  BUN 16  < > 15  CREATININE 1.05*  < > 0.63  CALCIUM 9.3  < > 8.3*  AST 30  --   --   ALT 13*  --   --   ALKPHOS 111  --   --   BILITOT 1.5*  --   --   < > = values in this interval not displayed. ------------------------------------------------------------------------------------------------------------------  Cardiac Enzymes No results for input(s): TROPONINI in the last 168 hours. ------------------------------------------------------------------------------------------------------------------  RADIOLOGY:  Dg Chest 2 View  Result Date: 12/14/2016 CLINICAL DATA:  Cough.  Right hemicolectomy performed on 12/02/2016. EXAM: CHEST  2 VIEW COMPARISON:  Multiple exams, including 12/06/2016 FINDINGS: Pleural thickening with blunting of the right  costophrenic angle and mild elevation of the right hemidiaphragm, similar to 12/06/2016. Small left pleural effusion with blunted posterior costophrenic angle. Wedge resection staple lines in the right lung. Dual lead pacer remains in place. Underlying emphysema. Thoracic spondylosis. Notable levoconvex lumbar scoliosis. Volume loss in the right hemithorax, chronic. Atherosclerotic calcification of the aortic arch. IMPRESSION: 1. In my estimation the left pleural effusion is improved compared to 12/06/2016, although not totally resolved. 2. Pleural thickening and postoperative findings at the right lung base with mildly elevated right hemidiaphragm and continued blunting of the right costophrenic angle. Volume loss in the right hemithorax with wedge resections staple lines. 3. Levoconvex lumbar scoliosis. 4. Atherosclerotic aortic arch. 5. Emphysema. Electronically Signed   By: Van Clines M.D.   On: 12/08/2016 12:53    EKG:   Orders placed or performed during the hospital encounter of 12/02/16  . EKG 12-Lead  . EKG 12-Lead  . EKG 12-Lead  . EKG 12-Lead    IMPRESSION AND PLAN:   #1. Shortness of breath, cough, weight gain, pedal edema likely due to third spacing with her malnutrition. Consult nephrology tomorrow to evaluate for albumin, Lasix. Unable to use IV Lasix alone at this time because of hypotension.  #2 chronic atrial fibrillation: Developed hypotension with the small dose of by mouth metoprolol. Monitor her  on . Telemetry, hold the blood thinners because of episode of hemorrhoidal rectal bleed, discussed with Dr. Fleet Contras. Patient's hemoglobin checked tomorrow to make sure it stable before we restart eliquis. #3 COPD exacerbation: The patient has wheezing: Chest x-ray didn't show pneumonia. Added the IV Solu-Medrol, continue scheduled nebulizers.had h/o lung CA  #4 hypotension and bradycardia likely secondary related to beta blockers, hold the beta blocker if the blood pressure  improves consider small dose of IV Lasix alone.  #5, SSS; patient had history of pacemaker placement, interrogated during the last admission.  #6 chronic hyponatremia, patient received tolvaptan  DURING THE LAST ADMISSION, CONSULT NEPHROLOGY THIS TIME to see if she needs again.  , All the records are reviewed and case discussed with ED provider. Management plans discussed with the patient, family and they are in agreement.  CODE STATUS: full  TOTAL TIME TAKING CARE OF THIS PATIENT: 55 minutes.    Epifanio Lesches M.D on 11/24/2016 at 5:50 PM  Between 7am to 6pm - Pager - [phone]  After 6pm go to [email or web address] - password [email or web address] [facility]  Hospitalists  Office  [phone]  CC: Primary care physician; Glendon Axe, MD  Note: This dictation was prepared with Dragon dictation along with smaller phrase technology. Any transcriptional errors that result from this process are unintentional.

## 2016-12-14 LAB — SODIUM, URINE, RANDOM: SODIUM UR: 10 mmol/L

## 2016-12-14 LAB — URINALYSIS, COMPLETE (UACMP) WITH MICROSCOPIC
BACTERIA UA: NONE SEEN
Bilirubin Urine: NEGATIVE
GLUCOSE, UA: NEGATIVE mg/dL
Hgb urine dipstick: NEGATIVE
KETONES UR: NEGATIVE mg/dL
Leukocytes, UA: NEGATIVE
Nitrite: NEGATIVE
PROTEIN: NEGATIVE mg/dL
RBC / HPF: NONE SEEN RBC/hpf (ref 0–5)
Specific Gravity, Urine: 1.01 (ref 1.005–1.030)
pH: 6 (ref 5.0–8.0)

## 2016-12-14 LAB — BASIC METABOLIC PANEL
Anion gap: 12 (ref 5–15)
BUN: 23 mg/dL — AB (ref 6–20)
CALCIUM: 8.3 mg/dL — AB (ref 8.9–10.3)
CO2: 24 mmol/L (ref 22–32)
CREATININE: 1.12 mg/dL — AB (ref 0.44–1.00)
Chloride: 90 mmol/L — ABNORMAL LOW (ref 101–111)
GFR calc Af Amer: 53 mL/min — ABNORMAL LOW (ref >60)
GFR calc non Af Amer: 46 mL/min — ABNORMAL LOW (ref >60)
Glucose, Bld: 140 mg/dL — ABNORMAL HIGH (ref 65–99)
Potassium: 4.9 mmol/L (ref 3.5–5.1)
SODIUM: 126 mmol/L — AB (ref 135–145)

## 2016-12-14 LAB — CBC
HEMATOCRIT: 28.3 % — AB (ref 35.0–47.0)
Hemoglobin: 9.7 g/dL — ABNORMAL LOW (ref 12.0–16.0)
MCH: 34.6 pg — ABNORMAL HIGH (ref 26.0–34.0)
MCHC: 34.1 g/dL (ref 32.0–36.0)
MCV: 101.5 fL — ABNORMAL HIGH (ref 80.0–100.0)
PLATELETS: 198 10*3/uL (ref 150–440)
RBC: 2.79 MIL/uL — ABNORMAL LOW (ref 3.80–5.20)
RDW: 16.8 % — AB (ref 11.5–14.5)
WBC: 12 10*3/uL — AB (ref 3.6–11.0)

## 2016-12-14 LAB — GLUCOSE, CAPILLARY: Glucose-Capillary: 126 mg/dL — ABNORMAL HIGH (ref 65–99)

## 2016-12-14 LAB — OSMOLALITY, URINE: OSMOLALITY UR: 273 mosm/kg — AB (ref 300–900)

## 2016-12-14 LAB — CHLORIDE, URINE, RANDOM: CHLORIDE URINE: 30 mmol/L

## 2016-12-14 MED ORDER — FLECAINIDE ACETATE 50 MG PO TABS: 75.0000 mg | ORAL_TABLET | Freq: Two times a day (BID) | ORAL | Status: DC

## 2016-12-14 MED ORDER — FUROSEMIDE 10 MG/ML IJ SOLN
20.0000 mg | Freq: Once | INTRAMUSCULAR | Status: AC
  Administered 2016-12-14: 16:00:00 20 mg via INTRAVENOUS
  Filled 2016-12-14: qty 2

## 2016-12-14 MED ORDER — OXYCODONE-ACETAMINOPHEN 7.5-325 MG PO TABS
1.0000 | ORAL_TABLET | Freq: Four times a day (QID) | ORAL | Status: DC | PRN
  Administered 2016-12-14 – 2016-12-15 (×2): 1 via ORAL
  Filled 2016-12-14 (×2): qty 1

## 2016-12-14 MED ORDER — FLECAINIDE ACETATE 50 MG PO TABS
75.0000 mg | ORAL_TABLET | Freq: Two times a day (BID) | ORAL | Status: DC
  Filled 2016-12-14: qty 2

## 2016-12-14 MED ORDER — APIXABAN 5 MG PO TABS
5.0000 mg | ORAL_TABLET | Freq: Two times a day (BID) | ORAL | Status: DC
  Administered 2016-12-14: 5 mg via ORAL
  Filled 2016-12-14 (×2): qty 1

## 2016-12-14 MED ORDER — IPRATROPIUM-ALBUTEROL 0.5-2.5 (3) MG/3ML IN SOLN
3.0000 mL | Freq: Three times a day (TID) | RESPIRATORY_TRACT | Status: DC
  Administered 2016-12-14 – 2016-12-15 (×2): 3 mL via RESPIRATORY_TRACT
  Filled 2016-12-14 (×2): qty 3

## 2016-12-14 MED ORDER — METHYLPREDNISOLONE SODIUM SUCC 125 MG IJ SOLR
60.0000 mg | INTRAMUSCULAR | Status: DC
Start: 2016-12-15 — End: 2016-12-14

## 2016-12-14 MED ORDER — MORPHINE SULFATE (PF) 2 MG/ML IV SOLN
2.0000 mg | INTRAVENOUS | Status: DC | PRN
Start: 2016-12-14 — End: 2016-12-15
  Filled 2016-12-14: qty 1

## 2016-12-14 NOTE — Care Management (Signed)
Admitted to [facility] with the diagnosis of shortness of breathe under observation status. Discharged from this facility 12/10/16 to [facility] for rehabilitation following a right hemicolectomy 12/02/16. A resident of [facility] with her husband, Elenore Rota [phone]). Last seen Dr, Glendon Axe 11/05/17.                                                                                                                          Nephrology consult in progress. [place] MSN CCM Care Management

## 2016-12-14 NOTE — NC FL2 (Signed)
[place] LEVEL OF CARE SCREENING TOOL     IDENTIFICATION  Patient Name: Cheryl Cannon Birthdate: 1936-08-23 Sex: female Admission Date (Current Location): 12/01/2016  [place] and [place] Number:  Engineering geologist and Address:  [facility], [address] [place], [place] [address]      Provider Number: [id number]  Attending Physician Name and Address:  Epifanio Lesches, MD  Relative Name and Phone Number:       Current Level of Care: Hospital Recommended Level of Care: [place] Prior Approval Number:    Date Approved/Denied:   PASRR Number:  [id number])  Discharge Plan: SNF    Current Diagnoses: Patient Active Problem List   Diagnosis Date Noted  . Early satiety 11/29/2016  . Status post right hemicolectomy 11/25/2016  . Cough 11/28/2016  . Hypotension 11/29/2016  . SOB (shortness of breath) 12/15/2016  . Malnutrition of moderate degree 12/09/2016  . Colon cancer ([place]) 12/02/2016  . Malignant neoplasm of ascending colon ([place]) 11/30/2016  . SSS (sick sinus syndrome) ([place]) 11/05/2016  . Elevated troponin 11/05/2016  . UTI (lower urinary tract infection) 07/20/2016  . Pulmonary embolism ([place]) 11/20/2015  . CAP (community acquired pneumonia) 11/12/2015  . Mild chronic obstructive pulmonary disease ([place]) 10/14/2015  . B12 deficiency 10/14/2015  . Pulmonary hypertension 09/25/2015  . Benign essential HTN 09/04/2015  . MI (mitral incompetence) 09/04/2015  . TI (tricuspid incompetence) 09/04/2015  . Insomnia, persistent 07/14/2015  . Mild dementia 07/14/2015  . H/O gastrointestinal disease 06/09/2015  . Hereditary and idiopathic peripheral neuropathy 05/12/2015  . D (diarrhea) 11/25/2014  . Chronic obstructive pulmonary disease ([place]) 10/20/2014  . Sensory neuropathy ([place]) 10/20/2014  . Neuropathy ([place]) 08/16/2014  . Dementia 08/16/2014  . Lung cancer ([place]) 08/16/2014  . Celiac disease 08/16/2014  .  Atrial fibrillation ([place]) 08/16/2014  . Breath shortness 06/13/2012  . Encounter for adjustment and management of other part of cardiac pacemaker 05/10/2012  . Non-small cell carcinoma of lung ([place]) 09/08/2011  . Artificial cardiac pacemaker 09/08/2011  . Malignant pleural effusion 08/20/2010  . Cancer of lung ([place]) 04/24/2010    Orientation RESPIRATION BLADDER Height & Weight     Self, Place  O2 (Nasal Cannula 3L/min) Incontinent Weight: 127 lb 12.8 oz (58 kg) Height:  '5\' 2"'$  (157.5 cm)  BEHAVIORAL SYMPTOMS/MOOD NEUROLOGICAL BOWEL NUTRITION STATUS   (None.)  (None) Continent Diet (Diet: Soft Room )  AMBULATORY STATUS COMMUNICATION OF NEEDS Skin   Extensive Assist Verbally Surgical wounds (Incision: Left/Lower Abdomen )                       Personal Care Assistance Level of Assistance  Bathing, Feeding, Dressing Bathing Assistance: Limited assistance Feeding assistance: Independent Dressing Assistance: Limited assistance     Functional Limitations Info  Sight, Hearing, Speech Sight Info: Adequate Hearing Info: Adequate Speech Info: Impaired (Missing teeth )    SPECIAL CARE FACTORS FREQUENCY  PT (By licensed PT), OT (By licensed OT)     PT Frequency:  (5) OT Frequency:  (5)            Contractures      Additional Factors Info  Code Status, Allergies Code Status Info:  (Full Code) Allergies Info:  (Gluten Meal, Parabens)           Current Medications (12/14/2016):  This is the current hospital active medication list Current Facility-Administered Medications  Medication Dose Route Frequency Provider Last Rate Last Dose  .  acetaminophen (TYLENOL) tablet 650 mg  650 mg Oral Q6H PRN Epifanio Lesches, MD   650 mg at 12/14/16 0850   Or  . acetaminophen (TYLENOL) suppository 650 mg  650 mg Rectal Q6H PRN Epifanio Lesches, MD      . ALPRAZolam Duanne Moron) tablet 0.25 mg  0.25 mg Oral Daily PRN Epifanio Lesches, MD   0.25 mg at 12/14/16 0845  . [START ON  12/20/2016] cyanocobalamin ((VITAMIN B-12)) injection 1,000 mcg  1,000 mcg Intramuscular Q30 days Epifanio Lesches, MD      . donepezil (ARICEPT) tablet 10 mg  10 mg Oral QHS Epifanio Lesches, MD      . feeding supplement (ENSURE ENLIVE) (ENSURE ENLIVE) liquid 237 mL  237 mL Oral BID BM Epifanio Lesches, MD      . gabapentin (NEURONTIN) capsule 1,200 mg  1,200 mg Oral BID Epifanio Lesches, MD      . hydrocortisone (ANUSOL-HC) 2.5 % rectal cream   Rectal TID PRN Epifanio Lesches, MD      . ipratropium-albuterol (DUONEB) 0.5-2.5 (3) MG/3ML nebulizer solution 3 mL  3 mL Nebulization Q4H Epifanio Lesches, MD   3 mL at 12/14/16 0358  . memantine (NAMENDA) tablet 10 mg  10 mg Oral Daily Epifanio Lesches, MD      . methylPREDNISolone sodium succinate (SOLU-MEDROL) 125 mg/2 mL injection 60 mg  60 mg Intravenous Q6H Epifanio Lesches, MD   60 mg at 12/14/16 1505  . ondansetron (ZOFRAN) tablet 4 mg  4 mg Oral Q6H PRN Epifanio Lesches, MD       Or  . ondansetron (ZOFRAN) injection 4 mg  4 mg Intravenous Q6H PRN Epifanio Lesches, MD      . pantoprazole (PROTONIX) EC tablet 40 mg  40 mg Oral Daily Epifanio Lesches, MD      . tiotropium (SPIRIVA) inhalation capsule 18 mcg  18 mcg Inhalation Daily Epifanio Lesches, MD         Discharge Medications: Please see discharge summary for a list of discharge medications.  Relevant Imaging Results:  Relevant Lab Results:   Additional Information  (SSN: [id number])  Danie Chandler, Student-Social Work

## 2016-12-14 NOTE — Progress Notes (Signed)
Afebrile. BP remains low, bradycardia resumed after yesterday's tachycardia treated with one po dose of metoprolol. Still complains of being full, had reported severe lower abdominal pain today. Lungs: Some congestion after yesterday's IV fluids. Sats remain OK. Using O2, comfort rather than need. ABD: Scaphoid. Edema of skin to midback, hips, thighs. BS: Normal. No focal tenderness. Extrem: Stable, bilateral edema. Labs: HGB: Up. WBC trending down. Discussed w/ Dr. Posey Pronto, echo shows severe TR and CT of January 15th showed marked reflux into the hepatic veins. History of pulmonary hypertension. Albumin is reasonable at 3.8, but patient needs some nutrition. Whether her "full" feeling is secondary to SB edema is more likely than any mechanical/ infectious process. Encourage re: diet. CT showed spiculated lesion in LLL. PET/CT recommended. May be reasonable to do while inpatient and no contrast exposure.

## 2016-12-14 NOTE — Plan of Care (Signed)
Pt's HR is low < 60.  Called Dr. Posey Pronto and she said to hold the flecainide tonight.  Also let her know the bladder scan was 272.  She doesn't want an in/out done until bladder scan shows 350-400 cc.  She doesn't want a foley done at all.

## 2016-12-14 NOTE — Progress Notes (Signed)
[place] at [place] NAME: Cheryl Cannon    MR#:  [id number]  DATE OF BIRTH:  November 23, 1935  SUBJECTIVE:    REVIEW OF SYSTEMS:   ROS Tolerating Diet: Tolerating PT:   DRUG ALLERGIES:   Allergies  Allergen Reactions  . Gluten Meal Other (See Comments)    GI UPSET/ Celiac disease.  . Parabens Rash    VITALS:  Blood pressure (!) 93/36, pulse 60, temperature 97.4 F (36.3 C), temperature source Oral, resp. rate 16, height '5\' 2"'$  (1.575 m), weight 58 kg (127 lb 12.8 oz), SpO2 97 %.  PHYSICAL EXAMINATION:   Physical Exam  GENERAL:  81 y.o.-year-old patient lying in the bed with no acute distress.  EYES: Pupils equal, round, reactive to light and accommodation. No scleral icterus. Extraocular muscles intact.  HEENT: Head atraumatic, normocephalic. Oropharynx and nasopharynx clear.  NECK:  Supple, no jugular venous distention. No thyroid enlargement, no tenderness.  LUNGS: Normal breath sounds bilaterally, no wheezing, rales, rhonchi. No use of accessory muscles of respiration.  CARDIOVASCULAR: S1, S2 normal. No murmurs, rubs, or gallops.  ABDOMEN: Soft, nontender, nondistended. Bowel sounds present. No organomegaly or mass.  EXTREMITIES: No cyanosis, clubbing or edema b/l.    NEUROLOGIC: Cranial nerves II through XII are intact. No focal Motor or sensory deficits b/l.   PSYCHIATRIC:  patient is alert and oriented x 3.  SKIN: No obvious rash, lesion, or ulcer.   LABORATORY PANEL:  CBC  Recent Labs Lab 12/14/16 0424  WBC 12.0*  HGB 9.7*  HCT 28.3*  PLT 198    Chemistries   Recent Labs Lab 12/09/16 0541  12/14/16 0424  NA 132*  < > 126*  K 4.2  < > 4.9  CL 95*  < > 90*  CO2 27  < > 24  GLUCOSE 97  < > 140*  BUN 16  < > 23*  CREATININE 1.05*  < > 1.12*  CALCIUM 9.3  < > 8.3*  AST 30  --   --   ALT 13*  --   --   ALKPHOS 111  --   --   BILITOT 1.5*  --   --   < > = values in this interval not displayed. Cardiac  Enzymes No results for input(s): TROPONINI in the last 168 hours. RADIOLOGY:  Dg Chest 2 View  Result Date: 12/06/2016 CLINICAL DATA:  Cough.  Right hemicolectomy performed on 12/02/2016. EXAM: CHEST  2 VIEW COMPARISON:  Multiple exams, including 12/06/2016 FINDINGS: Pleural thickening with blunting of the right costophrenic angle and mild elevation of the right hemidiaphragm, similar to 12/06/2016. Small left pleural effusion with blunted posterior costophrenic angle. Wedge resection staple lines in the right lung. Dual lead pacer remains in place. Underlying emphysema. Thoracic spondylosis. Notable levoconvex lumbar scoliosis. Volume loss in the right hemithorax, chronic. Atherosclerotic calcification of the aortic arch. IMPRESSION: 1. In my estimation the left pleural effusion is improved compared to 12/06/2016, although not totally resolved. 2. Pleural thickening and postoperative findings at the right lung base with mildly elevated right hemidiaphragm and continued blunting of the right costophrenic angle. Volume loss in the right hemithorax with wedge resections staple lines. 3. Levoconvex lumbar scoliosis. 4. Atherosclerotic aortic arch. 5. Emphysema. Electronically Signed   By: Van Clines M.D.   On: 11/28/2016 12:53   Dg Abd 2 Views  Result Date: 12/15/2016 CLINICAL DATA:  81 year old female with a history of abdominal pain and chronic cough. Abdominal  pain. Right hemicolectomy 12/02/2016 EXAM: ABDOMEN - 2 VIEW COMPARISON:  Abdominal CT 07/20/2016, plain film chest 12/11/2016, 12/06/2016 FINDINGS: S-shaped scoliotic curvature of the thoracolumbar spine again noted with apex right curvature of lower thoracic spine and apex left curvature of lumbar spine. Multilevel degenerative changes. No displaced fracture. Gas within small bowel and colon without abnormal distention. Small amount of gas within the anatomic pelvis. No unexpected radiopaque foreign body. No unexpected soft tissue density. No  unexpected calcification. Surgical changes at the right lung base. IMPRESSION: Nonspecific bowel gas pattern, with no abnormally distended small bowel or colon. Signed, Dulcy Fanny. Earleen Newport, DO [facility] Electronically Signed   By: Corrie Mckusick D.O.   On: 12/09/2016 19:09   ASSESSMENT AND PLAN:  Cheryl Cannon  is a 81 y.o. female with a known history of Atrial fibrillation, COPD, dementia, recent right hemicolectomy, discharged on 19th of this month to rehabilitation by Dr. Fleet Contras.. Patient was here from January 11 to January 19, patient had  righthemicolectomy for colon cancer. Came in with hypotension, sob and weakness  1. Acute Systolic CHF -ECHO out pt shows severe TR and pulm HTN  S/o Right sided CHF -Elevated BNP -IV lasix gentle diuresis due to low BP  2. Nausea/Poor appetie -?cardiac cachexia -?megace -Dietitian to see  3.Recent right hemicolectomy for colon cancer -no more rectal bleeding -resume eliquis  4. H/o afib  -on po flecainide and eliquis -hgb stable at 9.7  5. COPD Stable -cont oxygen, prn nebs  6. PT to see pt   Case discussed with Care Management/Social Worker. Management plans discussed with the patient, family and they are in agreement.  CODE STATUS: FULL  DVT Prophylaxis:eliquis TOTAL TIME TAKING CARE OF THIS PATIENT: 30 minutes.  >50% time spent on counselling and coordination of care  POSSIBLE D/C IN 1-2 DAYS, DEPENDING ON CLINICAL CONDITION.  Note: This dictation was prepared with Dragon dictation along with smaller phrase technology. Any transcriptional errors that result from this process are unintentional.  Cheryl Cannon M.D on 12/14/2016 at 7:26 PM  Between 7am to 6pm - Pager - [phone]  After 6pm go to [email or web address] - password [email or web address] [place] [facility]  Office  [phone]  CC: Primary care physician; Glendon Axe, MD

## 2016-12-14 NOTE — Clinical Social Work Note (Addendum)
Clinical Social Work Assessment  Patient Details  Name: Cheryl Cannon MRN: [id number] Date of Birth: 1935/11/27  Date of referral:  12/14/16               Reason for consult:  Facility Placement                Permission sought to share information with:  Chartered certified accountant granted to share information::  Yes, Verbal Permission Granted  Name::      IT sales professional::   [facility]  Relationship::     Contact Information:     Housing/Transportation Living arrangements for the past 2 months:  [place] [employer]: Materials engineer) Source of Information:  Spouse, Other (Comment Required) (Patient's sister) Patient Interpreter Needed:  None Criminal Activity/Legal Involvement Pertinent to Current Situation/Hospitalization:  No - Comment as needed Significant Relationships:  Siblings, Spouse, Adult Children Lives with:  Facility Resident [employer] [place]: [employer]) Do you feel safe going back to the place where you live?  Yes Need for family participation in patient care:  No (Coment)  Care giving concerns:  Patient and husband currently live at the [facility], independent living at [employer]. Patient was moved to the care unit at [facility] last Friday (12/10/16).    Social Worker assessment / plan:  Social work Theatre manager received social work consult. PT is recommending SNF. Patient was laying in bed asleep. Soical work Theatre manager spoke with patients husband Cheryl Cannon and patient's sister Cheryl Cannon. Per patient's husband, patient and himself live at the [facility], but patient was moved to the care unit at [facility] last Friday (12/10/16) and is currently still there. Patient has several children, two daughters and two son, that live across [place]. Per patient's husband, patient was walking independently up until last admission into the hospital. Patient's HPOA is her husband Cheryl Cannon.  Patient's husband wishes for patient to return back to [facility] once being discharged. Per Cheryl Cannon, admissions coordinator at [facility], patient can return once discharged.   Fl2 is completed and fax out.   Employment status:  Unemployed Forensic scientist:  Medicare PT Recommendations:  [place] / Referral to community resources:  [place]  Patient/Family's Response to care:  Patient's husband wishes for patient to return back to [employer] once discharged.   Patient/Family's Understanding of and Emotional Response to Diagnosis, Current Treatment, and Prognosis:  Patient's husband and patient's sister was pleasant and thanked social work Theatre manager for coming by.   Emotional Assessment Appearance:  Appears stated age Attitude/Demeanor/Rapport:    Affect (typically observed):  Accepting, Adaptable, Appropriate Orientation:  Oriented to Self, Oriented to Place Alcohol / Substance use:  Not Applicable Psych involvement (Current and /or in the community):  No (Comment)  Discharge Needs  Concerns to be addressed:  Basic Needs Readmission within the last 30 days:    Current discharge risk:  None Barriers to Discharge:  Continued Medical Work up   [employer], [place] Work 12/14/2016, 9:27 AM

## 2016-12-14 NOTE — Progress Notes (Signed)
[facility]  ROUNDING NOTE   Subjective:  Patient known to our practice from previous consultation. Patient had a colon resection, discharged on 1/19 and returned with complaints of abdominal pain. Admits to nausea, decreased appetite, no vomiting today.  She has shortness of breath and hypotension, lasix held due to hypotension.  Nursing staff reports decreased urine output.   We have been asked to evaluate patient for hyponatremia.  She was discharged with a Na of 134 on 1/19.  Admit Na 126, and is the same today.  Objective:  Vital signs in last 24 hours:  Temp:  [96.6 F (35.9 C)-97.6 F (36.4 C)] 97.4 F (36.3 C) (01/23 0509) Pulse Rate:  [59-125] 59 (01/23 0509) Resp:  [14-20] 16 (01/23 0509) BP: (81-120)/(41-80) 95/42 (01/23 0509) SpO2:  [96 %-99 %] 96 % (01/23 0509) Weight:  [55.8 kg (123 lb)-58.6 kg (129 lb 3.2 oz)] 58 kg (127 lb 12.8 oz) (01/23 0509)  Weight change:  Filed Weights   11/24/2016 1911 12/14/16 0509  Weight: 58.6 kg (129 lb 3.2 oz) 58 kg (127 lb 12.8 oz)    Intake/Output: No intake/output data recorded.   Intake/Output this shift:  No intake/output data recorded.  Physical Exam: General: NAD, ill appearing  Head: Normocephalic, atraumatic. Moist oral mucosal membranes  Eyes: Anicteric  Neck: Supple  Lungs:  Slightly tachypneic, wheezing throughout, crackles at base bilaterally  Heart: irregular rate and rhythm  Abdomen:  Soft, mild lower abdominal tenderness, non-distended, surgical site c/d/i, +BS  Extremities: 2+ dependent edema of legs, L arm > R.  Neurologic: Nonfocal, moving all four extremities  Skin: No lesions       Basic Metabolic Panel:  Recent Labs Lab 12/08/16 0813  12/09/16 0541 12/09/16 1122 12/10/16 0409 12/02/2016 1301 12/14/16 0424  NA 119*  < > 132* 131* 134* 126* 126*  K 4.6  --  4.2  --  3.9 4.2 4.9  CL 87*  --  95*  --  97* 88* 90*  CO2 24  --  27  --  '29 28 24  '$ GLUCOSE 110*  --  97  --  94 117* 140*   BUN 21*  --  16  --  13 15 23*  CREATININE 1.10*  --  1.05*  --  0.86 0.63 1.12*  CALCIUM 8.7*  --  9.3  --  8.4* 8.3* 8.3*  < > = values in this interval not displayed.  Liver Function Tests:  Recent Labs Lab 12/09/16 0541  AST 30  ALT 13*  ALKPHOS 111  BILITOT 1.5*  PROT 6.7  ALBUMIN 3.8   No results for input(s): LIPASE, AMYLASE in the last 168 hours. No results for input(s): AMMONIA in the last 168 hours.  CBC:  Recent Labs Lab 12/09/16 1722 12/10/16 0409 12/01/2016 1301 12/14/16 0424  WBC  --  7.0 13.7* 12.0*  HGB 9.3* 9.2* 9.4* 9.7*  HCT 26.9* 27.1* 27.4* 28.3*  MCV  --  102.2* 100.1* 101.5*  PLT  --  164 182 198    Cardiac Enzymes: No results for input(s): CKTOTAL, CKMB, CKMBINDEX, TROPONINI in the last 168 hours.  BNP: Invalid input(s): POCBNP  CBG: No results for input(s): GLUCAP in the last 168 hours.  Microbiology: Results for orders placed or performed during the hospital encounter of 11/29/16  Surgical pcr screen     Status: None   Collection Time: 11/29/16  3:31 PM  Result Value Ref Range Status   MRSA, PCR NEGATIVE NEGATIVE Final  Staphylococcus aureus NEGATIVE NEGATIVE Final    Comment:        The Xpert SA Assay (FDA approved for NASAL specimens in patients over 62 years of age), is one component of a comprehensive surveillance program.  Test performance has been validated by [facility] for patients greater than or equal to 49 year old. It is not intended to diagnose infection nor to guide or monitor treatment.     Coagulation Studies: No results for input(s): LABPROT, INR in the last 72 hours.  Urinalysis: No results for input(s): COLORURINE, LABSPEC, PHURINE, GLUCOSEU, HGBUR, BILIRUBINUR, KETONESUR, PROTEINUR, UROBILINOGEN, NITRITE, LEUKOCYTESUR in the last 72 hours.  Invalid input(s): APPERANCEUR    Imaging: Dg Chest 2 View  Result Date: 12/20/2016 CLINICAL DATA:  Cough.  Right hemicolectomy performed on 12/02/2016.  EXAM: CHEST  2 VIEW COMPARISON:  Multiple exams, including 12/06/2016 FINDINGS: Pleural thickening with blunting of the right costophrenic angle and mild elevation of the right hemidiaphragm, similar to 12/06/2016. Small left pleural effusion with blunted posterior costophrenic angle. Wedge resection staple lines in the right lung. Dual lead pacer remains in place. Underlying emphysema. Thoracic spondylosis. Notable levoconvex lumbar scoliosis. Volume loss in the right hemithorax, chronic. Atherosclerotic calcification of the aortic arch. IMPRESSION: 1. In my estimation the left pleural effusion is improved compared to 12/06/2016, although not totally resolved. 2. Pleural thickening and postoperative findings at the right lung base with mildly elevated right hemidiaphragm and continued blunting of the right costophrenic angle. Volume loss in the right hemithorax with wedge resections staple lines. 3. Levoconvex lumbar scoliosis. 4. Atherosclerotic aortic arch. 5. Emphysema. Electronically Signed   By: Van Clines M.D.   On: 12/11/2016 12:53   Dg Abd 2 Views  Result Date: 12/15/2016 CLINICAL DATA:  81 year old female with a history of abdominal pain and chronic cough. Abdominal pain. Right hemicolectomy 12/02/2016 EXAM: ABDOMEN - 2 VIEW COMPARISON:  Abdominal CT 07/20/2016, plain film chest 12/14/2016, 12/06/2016 FINDINGS: S-shaped scoliotic curvature of the thoracolumbar spine again noted with apex right curvature of lower thoracic spine and apex left curvature of lumbar spine. Multilevel degenerative changes. No displaced fracture. Gas within small bowel and colon without abnormal distention. Small amount of gas within the anatomic pelvis. No unexpected radiopaque foreign body. No unexpected soft tissue density. No unexpected calcification. Surgical changes at the right lung base. IMPRESSION: Nonspecific bowel gas pattern, with no abnormally distended small bowel or colon. Signed, Dulcy Fanny. Earleen Newport, DO  [facility] Electronically Signed   By: Corrie Mckusick D.O.   On: 12/03/2016 19:09     Medications:    . [START ON 12/20/2016] cyanocobalamin  1,000 mcg Intramuscular Q30 days  . donepezil  10 mg Oral QHS  . feeding supplement (ENSURE ENLIVE)  237 mL Oral BID BM  . gabapentin  1,200 mg Oral BID  . ipratropium-albuterol  3 mL Nebulization Q4H  . memantine  10 mg Oral Daily  . [START ON 12/15/2016] methylPREDNISolone (SOLU-MEDROL) injection  60 mg Intravenous Q24H  . pantoprazole  40 mg Oral Daily  . tiotropium  18 mcg Inhalation Daily   acetaminophen **OR** acetaminophen, ALPRAZolam, hydrocortisone, morphine injection, ondansetron **OR** ondansetron (ZOFRAN) IV, oxyCODONE-acetaminophen  Assessment/ Plan:  Ms. Cheryl Cannon is a 81 y.o. white female with colon cancer status post colon resection, PE, atrial fibrillation, sick sinus syndrome status post AICD, lung lobectomy, COPD, congestive heart failure, dementia, who was admitted to [facility]   Dr. Bary Castilla did colon resection on 12/02/2016.  1. Hyponatremia: with peripheral edema and history of congestive heart failure. Status post tolvaptan on 1/17 -patient appears to be volume overload.  We are not able to use lasix due to hypotension. -We will check Urine electrolytes. -Patient may have underlying SIADH due to lung malignancy and emphysema.  2. Acute Renal Failure -hypotension could be contributing to worsening renal function. -Bedside bladder scan showed >287m, we have asked nurse to do I/O cath, check bladder scan q 8hrs, if retention persists, will consider foley cath.  3. Hypertension: blood pressure low.  - atrial fibrillation  -avoid IV saline for now because of respiratory compromise/SOB.   LOS: 0 Kendel Bessey 1/23/20189:54 AM

## 2016-12-15 ENCOUNTER — Inpatient Hospital Stay: Payer: Medicare Other

## 2016-12-15 DIAGNOSIS — Z85118 Personal history of other malignant neoplasm of bronchus and lung: Secondary | ICD-10-CM | POA: Diagnosis not present

## 2016-12-15 DIAGNOSIS — F0390 Unspecified dementia without behavioral disturbance: Secondary | ICD-10-CM | POA: Diagnosis present

## 2016-12-15 DIAGNOSIS — Z6823 Body mass index (BMI) 23.0-23.9, adult: Secondary | ICD-10-CM | POA: Diagnosis not present

## 2016-12-15 DIAGNOSIS — Z87891 Personal history of nicotine dependence: Secondary | ICD-10-CM | POA: Diagnosis not present

## 2016-12-15 DIAGNOSIS — Z86711 Personal history of pulmonary embolism: Secondary | ICD-10-CM | POA: Diagnosis not present

## 2016-12-15 DIAGNOSIS — K117 Disturbances of salivary secretion: Secondary | ICD-10-CM

## 2016-12-15 DIAGNOSIS — I110 Hypertensive heart disease with heart failure: Secondary | ICD-10-CM | POA: Diagnosis present

## 2016-12-15 DIAGNOSIS — Z9049 Acquired absence of other specified parts of digestive tract: Secondary | ICD-10-CM

## 2016-12-15 DIAGNOSIS — Z66 Do not resuscitate: Secondary | ICD-10-CM | POA: Diagnosis not present

## 2016-12-15 DIAGNOSIS — N179 Acute kidney failure, unspecified: Secondary | ICD-10-CM | POA: Diagnosis present

## 2016-12-15 DIAGNOSIS — E871 Hypo-osmolality and hyponatremia: Secondary | ICD-10-CM | POA: Diagnosis present

## 2016-12-15 DIAGNOSIS — E43 Unspecified severe protein-calorie malnutrition: Secondary | ICD-10-CM | POA: Diagnosis present

## 2016-12-15 DIAGNOSIS — G9340 Encephalopathy, unspecified: Secondary | ICD-10-CM | POA: Diagnosis present

## 2016-12-15 DIAGNOSIS — R627 Adult failure to thrive: Secondary | ICD-10-CM | POA: Diagnosis present

## 2016-12-15 DIAGNOSIS — I482 Chronic atrial fibrillation: Secondary | ICD-10-CM | POA: Diagnosis present

## 2016-12-15 DIAGNOSIS — Z515 Encounter for palliative care: Secondary | ICD-10-CM | POA: Diagnosis not present

## 2016-12-15 DIAGNOSIS — I5021 Acute systolic (congestive) heart failure: Secondary | ICD-10-CM | POA: Diagnosis present

## 2016-12-15 DIAGNOSIS — Z85038 Personal history of other malignant neoplasm of large intestine: Secondary | ICD-10-CM | POA: Diagnosis not present

## 2016-12-15 DIAGNOSIS — Z9221 Personal history of antineoplastic chemotherapy: Secondary | ICD-10-CM | POA: Diagnosis not present

## 2016-12-15 DIAGNOSIS — R0602 Shortness of breath: Secondary | ICD-10-CM | POA: Diagnosis present

## 2016-12-15 DIAGNOSIS — J441 Chronic obstructive pulmonary disease with (acute) exacerbation: Secondary | ICD-10-CM | POA: Diagnosis present

## 2016-12-15 DIAGNOSIS — I959 Hypotension, unspecified: Secondary | ICD-10-CM | POA: Diagnosis present

## 2016-12-15 DIAGNOSIS — Z95810 Presence of automatic (implantable) cardiac defibrillator: Secondary | ICD-10-CM | POA: Diagnosis not present

## 2016-12-15 DIAGNOSIS — I509 Heart failure, unspecified: Secondary | ICD-10-CM

## 2016-12-15 DIAGNOSIS — Z923 Personal history of irradiation: Secondary | ICD-10-CM | POA: Diagnosis not present

## 2016-12-15 DIAGNOSIS — L8990 Pressure ulcer of unspecified site, unspecified stage: Secondary | ICD-10-CM | POA: Insufficient documentation

## 2016-12-15 LAB — BASIC METABOLIC PANEL
ANION GAP: 12 (ref 5–15)
BUN: 43 mg/dL — ABNORMAL HIGH (ref 6–20)
CHLORIDE: 87 mmol/L — AB (ref 101–111)
CO2: 23 mmol/L (ref 22–32)
Calcium: 8.4 mg/dL — ABNORMAL LOW (ref 8.9–10.3)
Creatinine, Ser: 1.71 mg/dL — ABNORMAL HIGH (ref 0.44–1.00)
GFR calc non Af Amer: 27 mL/min — ABNORMAL LOW (ref >60)
GFR, EST AFRICAN AMERICAN: 32 mL/min — AB (ref >60)
Glucose, Bld: 134 mg/dL — ABNORMAL HIGH (ref 65–99)
Potassium: 5.3 mmol/L — ABNORMAL HIGH (ref 3.5–5.1)
Sodium: 122 mmol/L — ABNORMAL LOW (ref 135–145)

## 2016-12-15 LAB — CBC WITH DIFFERENTIAL/PLATELET
BASOS PCT: 0 %
Basophils Absolute: 0.1 10*3/uL (ref 0–0.1)
Eosinophils Absolute: 0 10*3/uL (ref 0–0.7)
Eosinophils Relative: 0 %
HEMATOCRIT: 27.9 % — AB (ref 35.0–47.0)
HEMOGLOBIN: 9.5 g/dL — AB (ref 12.0–16.0)
LYMPHS ABS: 0.3 10*3/uL — AB (ref 1.0–3.6)
Lymphocytes Relative: 1 %
MCH: 34.2 pg — ABNORMAL HIGH (ref 26.0–34.0)
MCHC: 34.1 g/dL (ref 32.0–36.0)
MCV: 100.4 fL — ABNORMAL HIGH (ref 80.0–100.0)
MONOS PCT: 4 %
Monocytes Absolute: 1 10*3/uL — ABNORMAL HIGH (ref 0.2–0.9)
NEUTROS ABS: 21.1 10*3/uL — AB (ref 1.4–6.5)
NEUTROS PCT: 95 %
Platelets: 251 10*3/uL (ref 150–440)
RBC: 2.78 MIL/uL — AB (ref 3.80–5.20)
RDW: 16.9 % — ABNORMAL HIGH (ref 11.5–14.5)
WBC: 22.5 10*3/uL — ABNORMAL HIGH (ref 3.6–11.0)

## 2016-12-15 LAB — URINE CULTURE: Culture: NO GROWTH

## 2016-12-15 LAB — GLUCOSE, CAPILLARY: Glucose-Capillary: 131 mg/dL — ABNORMAL HIGH (ref 65–99)

## 2016-12-15 MED ORDER — GLYCOPYRROLATE 0.2 MG/ML IJ SOLN
0.4000 mg | Freq: Three times a day (TID) | INTRAMUSCULAR | Status: DC
  Administered 2016-12-15 – 2016-12-16 (×3): 0.4 mg via INTRAVENOUS
  Filled 2016-12-15 (×3): qty 2

## 2016-12-15 MED ORDER — MORPHINE SULFATE (PF) 2 MG/ML IV SOLN
2.0000 mg | INTRAVENOUS | Status: DC | PRN
  Administered 2016-12-16 – 2016-12-19 (×11): 2 mg via INTRAVENOUS
  Filled 2016-12-15 (×10): qty 1

## 2016-12-15 MED ORDER — FLECAINIDE ACETATE 50 MG PO TABS: 50.0000 mg | ORAL_TABLET | Freq: Two times a day (BID) | ORAL | Status: DC

## 2016-12-15 MED ORDER — LORAZEPAM 2 MG/ML IJ SOLN
1.0000 mg | INTRAMUSCULAR | Status: DC | PRN
  Administered 2016-12-17 – 2016-12-19 (×5): 1 mg via INTRAVENOUS
  Filled 2016-12-15 (×5): qty 1

## 2016-12-15 NOTE — Progress Notes (Signed)
Initial Nutrition Assessment  DOCUMENTATION CODES:   Severe malnutrition in context of acute illness/injury  INTERVENTION:  Continue Ensure Enlive po BID, each supplement provides 350 kcal and 20 grams of protein.  Provide Magic cup TID with meals, each supplement provides 290 kcal and 9 grams of protein.  Will monitor outcome of discussions regarding goals of care. If patient to pursue supportive treatment, can consider use of appetite stimulant if medically appropriate as poor appetite and decreased PO intake is only acute.   NUTRITION DIAGNOSIS:   Inadequate oral intake related to poor appetite, nausea, other (see comment) (abdominal pain) as evidenced by per patient/family report.  GOAL:   Patient will meet greater than or equal to 90% of their needs  MONITOR:   PO intake, Supplement acceptance, Labs, Weight trends, I & O's  REASON FOR ASSESSMENT:   Consult Assessment of nutrition requirement/status  ASSESSMENT:   81 y.o. female with a known history of Atrial fibrillation, COPD, dementia, recent right hemicolectomy for colon cancer, presented with increasing weakness and very poor appetite. Patient found to have acute systolic CHF.   -Palliative Medicine has been consulted to discuss goals of care with patient.  RD received verbal consult to assess patient from MD. Spoke with patient and husband at bedside. Patient has not been able to eat any food since Saturday. She has only taken small bites of ice cream and sips of Ensure. Patient is experiencing lower abdominal pain and nausea.   Patient had reported on last admission UBW was 106 lbs. Weight currently increased in setting of volume overload.   Medications reviewed and none pertinent.  Labs reviewed: CBG 126-131, Sodium 122, Potassium 5.3, Chloride 87, BUN 43, Creatinine 1.71.   Nutrition-Focused physical exam completed. Findings are mild-moderate fat depletion, mild-moderate muscle depletion, and moderate  edema.  Discussed with RN. Patient is third-spacing fluid. Patient and family are considering palliative care/hospice.   Diet Order:  DIET SOFT Room service appropriate? Yes; Fluid consistency: Thin  Skin:  Wound (see comment) (Stg II to coccyx)  Last BM:  12/15/2016  Height:   Ht Readings from Last 1 Encounters:  12/16/2016 '5\' 2"'$  (1.575 m)    Weight:   Wt Readings from Last 1 Encounters:  12/14/16 127 lb 12.8 oz (58 kg)    Ideal Body Weight:  45.5 kg  BMI:  Body mass index is 23.37 kg/m.  Estimated Nutritional Needs:   Kcal:  1470-1715 (30-35 kcal/kg dry weight)  Protein:  64-75 grams (1.3-1.5 grams/kg dry weight)  Fluid:  1.2 L/day (25 ml/kg dry weight)  EDUCATION NEEDS:   No education needs identified at this time  Willey Blade, MS, RD, LDN Pager: [phone] After Hours Pager: [phone]

## 2016-12-15 NOTE — Progress Notes (Signed)
Reviewed events of the last 24 hours.  Spoke with Ms. Davis Gourd from Palliative care.  Laboratory studies show stable hemoglobin and rising white count, creatinine and potassium.  Confusion is new.  Abdomen: Nondistended, soft, normal bowel sounds.  Chest x-ray results reviewed.  Patient's husband and younger sister are comfortable with the idea of comfort care only.  Reviewed that this means she will not get intravenous feedings, cardioversion or intubation. This is acceptable.  Etiology for declined this likely primarily cardiac, with no clear evidence of intra-abdominal process.

## 2016-12-15 NOTE — Progress Notes (Signed)
Family Meeting Note  Advance Directive:husband HCPOA  Today a meeting took place with the Husband and pt's sister and MD  ptis unable to participate due BL:[id number] capacity and overall declining health and possible underlying dementia   d/w husband and sister that overall pt is declining. She has failure to thrive and severe right side CHF and severe TR Discussed code status and they are in agreement with DNR  Time spent during discussion 67 mins  Lynley Killilea, MD

## 2016-12-15 NOTE — Consult Note (Signed)
Consultation Note Date: 12/15/2016   Patient Name: Cheryl Cannon  DOB: 18-Oct-1936  MRN: [id number]  Age / Sex: 81 y.o., female  PCP: Glendon Axe, MD Referring Physician: Fritzi Mandes, MD  Reason for Consultation: Establishing goals of care and Psychosocial/spiritual support  HPI/Patient Profile: 81 y.o. female  admitted on 11/22/2016 with known history of Atrial fibrillation, COPD, dementia, recent right hemicolectomy, discharged on 19th of this month to rehabilitation Patient was here from January 11 to January 19, patient had  right  Hemi-colectomy for colon cancer that time. Re-admitted with increased weakness, poor appetite and poor progression with rehabilitation.  Continued physical, functional and cognitive decline.  Family faced with EOL decisions   Clinical Assessment and Goals of Care:  This NP Wadie Lessen reviewed medical records, received report from team, assessed the patient and then meet at the patient's bedside along with her husband and sister  to discuss diagnosis, prognosis, GOC, EOL wishes disposition and options.  A detailed discussion was had today regarding advanced directives.  Concepts specific to code status, artifical feeding and hydration, continued IV antibiotics and rehospitalization was had.  The difference between a aggressive medical intervention path  and a palliative comfort care path for this patient at this time was had.  Values and goals of care important to patient and family were attempted to be elicited.  Concept of Hospice and Palliative Care were discussed  Natural trajectory and expectations at EOL were discussed.  Questions and concerns addressed.   Family encouraged to call with questions or concerns.  PMT will continue to support holistically. "Gone from my Sight" booklet left for review.  HCPOA/husband    SUMMARY OF RECOMMENDATIONS    Code Status/Advance Care  Planning:  DNR   Symptom Management:   Dyspnea/Pain: Morphine IV 2 mg every 1 hr prn  Agitation: Ativan IV 1 mg every 4 hrs prn  Terminal secretion: Rubinol 0.4 mg every 8 hrs  Palliative Prophylaxis:   Aspiration, Frequent Pain Assessment and Oral Care  Additional Recommendations (Limitations, Scope, Preferences):  Full Comfort Care, Minimize Medications, No Artificial Feeding, No Diagnostics, No Glucose Monitoring, No IV Antibiotics, No IV Fluids and No Lab Draws  Psycho-social/Spiritual:   Desire for further Chaplaincy support:no, strong community church support  Additional Recommendations: Education on Hospice and Grief/Bereavement Support  Prognosis:   Hours - Days  Discharge Planning: Anticipated Hospital Death      Primary Diagnoses: Present on Admission: . SOB (shortness of breath)   I have reviewed the medical record, interviewed the patient and family, and examined the patient. The following aspects are pertinent.  Past Medical History:  Diagnosis Date  . A-fib ([place])   . Cancer ([place])   . Celiac disease   . CHF (congestive heart failure) ([place])   . COPD (chronic obstructive pulmonary disease) ([place])   . Dementia   . Lung cancer ([place]) 2010   [facility] with chemo/rad/surgery  . Memory deficit   . Neuropathy ([place])   . Pneumonia 2017  . SSS (sick sinus  syndrome) [facility])    Social History   Social History  . Marital status: Married    Spouse name: N/A  . Number of children: N/A  . Years of education: N/A   Social History Main Topics  . Smoking status: Former Smoker    Packs/day: 1.00    Years: 44.00    Quit date: 11/22/1998  . Smokeless tobacco: Never Used  . Alcohol use Yes     Comment: social  . Drug use: No  . Sexual activity: Yes    Birth control/ protection: Other-see comments     Comment: post menopausal   Other Topics Concern  . None   Social History Narrative   ** Merged History Encounter **       Family History  Problem Relation  Age of Onset  . Throat cancer Mother   . Cancer Sister    Scheduled Meds: . apixaban  5 mg Oral BID  . [START ON 12/20/2016] cyanocobalamin  1,000 mcg Intramuscular Q30 days  . donepezil  10 mg Oral QHS  . feeding supplement (ENSURE ENLIVE)  237 mL Oral BID BM  . gabapentin  1,200 mg Oral BID  . ipratropium-albuterol  3 mL Nebulization TID  . memantine  10 mg Oral Daily  . pantoprazole  40 mg Oral Daily  . tiotropium  18 mcg Inhalation Daily   Continuous Infusions: PRN Meds:.acetaminophen **OR** acetaminophen, ALPRAZolam, hydrocortisone, morphine injection, ondansetron **OR** ondansetron (ZOFRAN) IV, oxyCODONE-acetaminophen Medications Prior to Admission:  Prior to Admission medications   Medication Sig Start Date End Date Taking? Authorizing Provider  acetaminophen (TYLENOL) 325 MG tablet Take 2 tablets (650 mg total) by mouth every 4 (four) hours as needed for mild pain. 12/10/16  Yes Robert Bellow, MD  albuterol (PROVENTIL HFA;VENTOLIN HFA) 108 (90 Base) MCG/ACT inhaler Inhale 2 puffs into the lungs every 6 (six) hours as needed for wheezing or shortness of breath. 07/17/16  Yes Orbie Pyo, MD  ALPRAZolam Duanne Moron) 0.25 MG tablet Take 0.25 mg by mouth daily as needed for anxiety.    Yes Historical Provider, MD  Cholecalciferol (VITAMIN D) 2000 units CAPS Take 2,000 Units by mouth daily.   Yes Historical Provider, MD  cyanocobalamin (,VITAMIN B-12,) 1000 MCG/ML injection Inject 1,000 mcg into the muscle every 30 (thirty) days. Injection once a month  Next is due 12-20-2016   Yes Historical Provider, MD  donepezil (ARICEPT) 10 MG tablet Take 1 tablet (10 mg total) by mouth at bedtime. 09/14/16  Yes Cameron Sprang, MD  feeding supplement, ENSURE ENLIVE, (ENSURE ENLIVE) LIQD Take 237 mLs by mouth 2 (two) times daily between meals. 12/10/16  Yes Robert Bellow, MD  flecainide (TAMBOCOR) 50 MG tablet Take 75 mg by mouth 2 (two) times daily. Takes 1.5 tablets   Yes Historical  Provider, MD  furosemide (LASIX) 20 MG tablet Take 1 tablet (20 mg total) by mouth daily. 11/17/15  Yes Aldean Jewett, MD  gabapentin (NEURONTIN) 300 MG capsule Take 4 capsules (1,200 mg total) by mouth 2 (two) times daily. 09/14/16  Yes Cameron Sprang, MD  hydrocortisone (ANUSOL-HC) 2.5 % rectal cream Place rectally 3 (three) times daily as needed for hemorrhoids or itching (peri-anal pain.). 12/10/16  Yes Robert Bellow, MD  memantine (NAMENDA) 10 MG tablet Take 1 tablet daily for 1 week, then increase to 1 tablet twice a day Patient taking differently: Take 10 mg by mouth daily.  09/14/16  Yes Cameron Sprang, MD  pantoprazole ([place])  40 MG tablet Take 40 mg by mouth daily.    Yes Historical Provider, MD  potassium chloride (K-DUR) 10 MEQ tablet Take 1 tablet (10 mEq total) by mouth daily. 12/10/16  Yes Robert Bellow, MD  tiotropium (SPIRIVA) 18 MCG inhalation capsule Place 18 mcg into inhaler and inhale daily.    Yes Historical Provider, MD   Allergies  Allergen Reactions  . Gluten Meal Other (See Comments)    GI UPSET/ Celiac disease.  . Parabens Rash   Review of Systems  Unable to perform ROS: Acuity of condition    Physical Exam  Constitutional: She appears lethargic. She appears cachectic. She appears ill.  HENT:  Mouth/Throat: Mucous membranes are dry.  Cardiovascular: Normal rate, regular rhythm and normal heart sounds.   Pulmonary/Chest: She has decreased breath sounds in the right lower field and the left lower field.  -noted periods of apnea  Neurological: She appears lethargic.  Skin: Skin is warm and dry.    Vital Signs: BP (!) 101/39 (BP Location: Left Arm)   Pulse (!) 59   Temp 97.9 F (36.6 C) (Oral)   Resp 17   Ht '5\' 2"'$  (1.575 m)   Wt 58 kg (127 lb 12.8 oz)   SpO2 96%   BMI 23.37 kg/m  Pain Assessment: No/denies pain   Pain Score: 8    SpO2: SpO2: 96 % O2 Device:SpO2: 96 % O2 Flow Rate: .O2 Flow Rate (L/min): 3 L/min  IO: Intake/output  summary:  Intake/Output Summary (Last 24 hours) at 12/15/16 1108 Last data filed at 12/15/16 0800  Gross per 24 hour  Intake              600 ml  Output                0 ml  Net              600 ml    LBM: Last BM Date: 12/09/2016 Baseline Weight: Weight: 58.6 kg (129 lb 3.2 oz) Most recent weight: Weight: 58 kg (127 lb 12.8 oz)      Palliative Assessment/Data: 20 % at best   Discussed with Dr Posey Pronto  Time In: 1200 Time Out: 1315 Time Total: 75 min Greater than 50%  of this time was spent counseling and coordinating care related to the above assessment and plan.  Signed by: Wadie Lessen, NP   Please contact Palliative Medicine Team phone at [phone] for questions and concerns.  For individual provider: See Shea Evans

## 2016-12-15 NOTE — Progress Notes (Signed)
[place] at [place] NAME: Cheryl Cannon    MR#:  [id number]  DATE OF BIRTH:  03-06-1936  SUBJECTIVE:   Came in with increasing weakness and very poor appetite REVIEW OF SYSTEMS:   Review of Systems  Constitutional: Negative for chills, fever and weight loss.  HENT: Negative for ear discharge, ear pain and nosebleeds.   Eyes: Negative for blurred vision, pain and discharge.  Respiratory: Negative for sputum production, shortness of breath, wheezing and stridor.   Cardiovascular: Negative for chest pain, palpitations, orthopnea and PND.  Gastrointestinal: Negative for abdominal pain, diarrhea and vomiting.  Genitourinary: Negative for frequency and urgency.  Musculoskeletal: Negative for back pain and joint pain.  Neurological: Positive for weakness. Negative for sensory change, speech change and focal weakness.  Psychiatric/Behavioral: Negative for depression and hallucinations. The patient is not nervous/anxious.    Tolerating Diet: no Tolerating PT: not done  DRUG ALLERGIES:   Allergies  Allergen Reactions  . Gluten Meal Other (See Comments)    GI UPSET/ Celiac disease.  . Parabens Rash    VITALS:  Blood pressure (!) 101/39, pulse (!) 59, temperature 97.9 F (36.6 C), temperature source Oral, resp. rate 17, height '5\' 2"'$  (1.575 m), weight 58 kg (127 lb 12.8 oz), SpO2 96 %.  PHYSICAL EXAMINATION:   Physical Exam  GENERAL:  81 y.o.-year-old patient lying in the bed with no acute distress. Weak,  EYES: Pupils equal, round, reactive to light and accommodation. No scleral icterus. Extraocular muscles intact.  HEENT: Head atraumatic, normocephalic. Oropharynx and nasopharynx clear.  NECK:  Supple, no jugular venous distention. No thyroid enlargement, no tenderness.  LUNGS: Normal breath sounds bilaterally, no wheezing, rales, rhonchi. No use of accessory muscles of respiration.  CARDIOVASCULAR: S1, S2 normal. No murmurs, rubs,  or gallops.  ABDOMEN: Soft, nontender, nondistended. Bowel sounds present. No organomegaly or mass. surigcal scar+EXTREMITIES: No cyanosis, clubbing or edema b/l.    NEUROLOGIC: unable to assess but overall nonfocal PSYCHIATRIC:  patient is alert  But confused SKIN: No obvious rash, lesion, or ulcer.   LABORATORY PANEL:  CBC  Recent Labs Lab 12/14/16 0424  WBC 12.0*  HGB 9.7*  HCT 28.3*  PLT 198    Chemistries   Recent Labs Lab 12/09/16 0541  12/14/16 0424  NA 132*  < > 126*  K 4.2  < > 4.9  CL 95*  < > 90*  CO2 27  < > 24  GLUCOSE 97  < > 140*  BUN 16  < > 23*  CREATININE 1.05*  < > 1.12*  CALCIUM 9.3  < > 8.3*  AST 30  --   --   ALT 13*  --   --   ALKPHOS 111  --   --   BILITOT 1.5*  --   --   < > = values in this interval not displayed. Cardiac Enzymes No results for input(s): TROPONINI in the last 168 hours. RADIOLOGY:  Dg Chest 2 View  Result Date: 11/22/2016 CLINICAL DATA:  Cough.  Right hemicolectomy performed on 12/02/2016. EXAM: CHEST  2 VIEW COMPARISON:  Multiple exams, including 12/06/2016 FINDINGS: Pleural thickening with blunting of the right costophrenic angle and mild elevation of the right hemidiaphragm, similar to 12/06/2016. Small left pleural effusion with blunted posterior costophrenic angle. Wedge resection staple lines in the right lung. Dual lead pacer remains in place. Underlying emphysema. Thoracic spondylosis. Notable levoconvex lumbar scoliosis. Volume loss in the right hemithorax, chronic.  Atherosclerotic calcification of the aortic arch. IMPRESSION: 1. In my estimation the left pleural effusion is improved compared to 12/06/2016, although not totally resolved. 2. Pleural thickening and postoperative findings at the right lung base with mildly elevated right hemidiaphragm and continued blunting of the right costophrenic angle. Volume loss in the right hemithorax with wedge resections staple lines. 3. Levoconvex lumbar scoliosis. 4. Atherosclerotic  aortic arch. 5. Emphysema. Electronically Signed   By: Van Clines M.D.   On: 12/21/2016 12:53   Dg Abd 2 Views  Result Date: 12/02/2016 CLINICAL DATA:  81 year old female with a history of abdominal pain and chronic cough. Abdominal pain. Right hemicolectomy 12/02/2016 EXAM: ABDOMEN - 2 VIEW COMPARISON:  Abdominal CT 07/20/2016, plain film chest 11/24/2016, 12/06/2016 FINDINGS: S-shaped scoliotic curvature of the thoracolumbar spine again noted with apex right curvature of lower thoracic spine and apex left curvature of lumbar spine. Multilevel degenerative changes. No displaced fracture. Gas within small bowel and colon without abnormal distention. Small amount of gas within the anatomic pelvis. No unexpected radiopaque foreign body. No unexpected soft tissue density. No unexpected calcification. Surgical changes at the right lung base. IMPRESSION: Nonspecific bowel gas pattern, with no abnormally distended small bowel or colon. Signed, Dulcy Fanny. Earleen Newport, DO [facility]iology Electronically Signed   By: Corrie Mckusick D.O.   On: 11/25/2016 19:09   ASSESSMENT AND PLAN:  Mykeria Garman  is a 81 y.o. female with a known history of Atrial fibrillation, COPD, dementia, recent right hemicolectomy, discharged on 19th of this month to rehabilitation by Dr. Fleet Contras.. Patient was here from January 11 to January 19, patient had  righthemicolectomy for colon cancer. Came in with hypotension, sob and weakness  1. Acute Systolic CHF -ECHO out pt shows severe TR  S/o Right sided CHF -Elevated BNP -IV lasix gentle diuresis due to low BP -pt has been overall declining. Very poor appetite, confusion. Spoke at length with husband and sister in the room.   2. Nausea/Poor appetie -?cardiac cachexia -?megace -Dietitian to see  3.Recent right hemicolectomy for colon cancer -no more rectal bleeding -resume eliquis  4. H/o afib  -on po eliquis -hgb stable at  9.7 -HR in the 50's  Hold flecainide  5. COPD  Stable -cont oxygen, prn nebs  Palliative care to see. Family agreeable   Case discussed with Care Management/Social Worker. Management plans discussed with the patient, family and they are in agreement.  CODE STATUS: DNR DVT Prophylaxis:eliquis TOTAL TIME TAKING CARE OF THIS PATIENT: 30 minutes.  >50% time spent on counselling and coordination of care  POSSIBLE D/C IN 1-2 DAYS, DEPENDING ON CLINICAL CONDITION.  Note: This dictation was prepared with Dragon dictation along with smaller phrase technology. Any transcriptional errors that result from this process are unintentional.  Veda Arrellano M.D on 12/15/2016 at 8:48 AM  Between 7am to 6pm - Pager - [phone]  After 6pm go to [email or web address] - password [email or web address] [facility]  Office  [phone]  CC: Primary care physician; Glendon Axe, MD

## 2016-12-16 DIAGNOSIS — Z515 Encounter for palliative care: Secondary | ICD-10-CM

## 2016-12-16 MED ORDER — GLYCOPYRROLATE 0.2 MG/ML IJ SOLN
0.6000 mg | Freq: Four times a day (QID) | INTRAMUSCULAR | Status: DC
  Administered 2016-12-16 – 2016-12-19 (×8): 0.6 mg via INTRAVENOUS
  Filled 2016-12-16 (×9): qty 3

## 2016-12-16 MED ORDER — GLYCOPYRROLATE 0.2 MG/ML IJ SOLN
0.4000 mg | Freq: Four times a day (QID) | INTRAMUSCULAR | Status: DC
  Administered 2016-12-16 (×3): 0.4 mg via INTRAVENOUS
  Filled 2016-12-16 (×3): qty 2

## 2016-12-16 NOTE — Progress Notes (Signed)
[place] at [place] NAME: Cheryl Cannon    MR#:  [id number]  DATE OF BIRTH:  1936/09/16  SUBJECTIVE:  Lethargic, although family at bedside.  She responded some as per family and had some chat with family.  She looks comfortable REVIEW OF SYSTEMS:   Review of Systems  Unable to perform ROS: Critical illness   DRUG ALLERGIES:   Allergies  Allergen Reactions  . Gluten Meal Other (See Comments)    GI UPSET/ Celiac disease.  . Parabens Rash    VITALS:  Blood pressure (!) 89/16, pulse 61, temperature 98 F (36.7 C), temperature source Oral, resp. rate (!) 8, height '5\' 2"'$  (1.575 m), weight 58 kg (127 lb 12.8 oz), SpO2 96 %. PHYSICAL EXAMINATION:  Physical Exam  Constitutional: She appears lethargic and malnourished. She appears unhealthy. She appears cachectic. She appears toxic. She has a sickly appearance.  HENT:  Head: Normocephalic and atraumatic.  Eyes: Conjunctivae and EOM are normal. Pupils are equal, round, and reactive to light.  Neck: Normal range of motion. Neck supple. No tracheal deviation present. No thyromegaly present.  Cardiovascular: Normal rate, regular rhythm and normal heart sounds.   Pulmonary/Chest: Effort normal and breath sounds normal. No respiratory distress. She has no wheezes. She exhibits no tenderness.  Abdominal: Soft. Bowel sounds are normal. She exhibits no distension. There is no tenderness.  scar  Musculoskeletal: Normal range of motion.  Neurological: She appears lethargic. She is disoriented. No cranial nerve deficit.  Unable to assess due to her mental status  Skin: Skin is warm and dry. No rash noted.  Psychiatric: Mood and affect normal.    LABORATORY PANEL:  CBC  Recent Labs Lab 12/15/16 0856  WBC 22.5*  HGB 9.5*  HCT 27.9*  PLT 251    Chemistries   Recent Labs Lab 12/15/16 0856  NA 122*  K 5.3*  CL 87*  CO2 23  GLUCOSE 134*  BUN 43*  CREATININE 1.71*  CALCIUM 8.4*    Cardiac Enzymes No results for input(s): TROPONINI in the last 168 hours. RADIOLOGY:  Dg Chest Port 1 View  Result Date: 12/15/2016 CLINICAL DATA:  Cough, shortness of breath, weakness EXAM: PORTABLE CHEST 1 VIEW COMPARISON:  12/03/2016 FINDINGS: Small right pleural effusion. Postoperative changes on the right. Left lung is clear. Mild cardiomegaly. Left pacer is unchanged. IMPRESSION: Small right pleural effusion with right base atelectasis, similar to prior study. Electronically Signed   By: Rolm Baptise M.D.   On: 12/15/2016 08:50   ASSESSMENT AND PLAN:  Renetta Suman  is a 81 y.o. female with a known history of Atrial fibrillation, COPD, dementia, recent right hemicolectomy, discharged on 19th of this month to rehabilitation by Dr. Fleet Contras.. Patient was here from January 11 to January 19, patient had  righthemicolectomy for colon cancer. Came in with hypotension, sob and weakness  1. Acute Systolic CHF - Comfort care  2. Nausea/Poor appetie -Comfort care  3.Recent right hemicolectomy for colon cancer -Comfort care  4. H/o afib  -Comfort care  5. COPD    Family at bedside.  Patient seemed to be actively dying.  I doubt she will be in a condition did get transferred to hospice home at this point   Case discussed with Care Management/Social Worker. Management plans discussed with the patient, family and they are in agreement.  CODE STATUS: DO NOT RESUSCITATE comfort care  TOTAL TIME TAKING CARE OF THIS PATIENT: 10 minutes.   >50%  time spent on counselling and coordination of care  Note: This dictation was prepared with Dragon dictation along with smaller phrase technology. Any transcriptional errors that result from this process are unintentional.  Max Sane M.D on 12/16/2016 at 9:02 AM  Between 7am to 6pm - Pager - [phone]  After 6pm go to [email or web address] - password [email or web address] [facility]  Office  [phone]  CC: Primary care physician;  Glendon Axe, MD

## 2016-12-16 NOTE — Progress Notes (Addendum)
2340 Pt continues to have congestion, rattling with respirations.  Text paged [facility] and received order to increase dose of Robinol.  Gave 0.6 mg IV and explained to family change in dose.  Also wrote times on board for Robinul and morphine. Dorna Bloom RN  0030 Robinul does not seem to be effective.  Text messaged [facility] requesting possible different medication.  Scopolamine TD ordered and given.  Educated family on medication .  Also gave pt Ativan 1 mg IV and this slowed pt's respirations down to 16 which made the family less anxious and concerned about the pt's occasional rapid breathing and coarse respiration sounds. Dorna Bloom RN

## 2016-12-16 NOTE — Progress Notes (Signed)
Pt voicing some discomfort in terms of small sounds.  Rattling, rapid respirations.  Gave robinol and MS IV.  RR 30, HR 110, lungs coarse crackles. Family at bedside.  Dorna Bloom RN

## 2016-12-16 NOTE — Progress Notes (Signed)
Daily Progress Note   Patient Name: Cheryl Cannon       Date: 12/16/2016 DOB: 27-May-1936  Age: 81 y.o. MRN#: [id number] Attending Physician: Max Sane, MD Primary Care Physician: Glendon Axe, MD Admit Date: 12/08/2016  Reason for Consultation/Follow-up: Establishing goals of care, Non pain symptom management, Pain control, Psychosocial/spiritual support and Terminal Care 2/2 transition at EOL  Subjective:  --discussed symptom management strategies specific to terminal secretions and dyspnea  - Large family gathered, created space for family to share life stories and express love for pateitn and each other.   Continued conversation regarding natural trajectory and expectations at    EOL,  questions and concerns addressed.  -shared life expectancy of likely hours to days, emotional support offered     Length of Stay: 1  Current Medications: Scheduled Meds:  . glycopyrrolate  0.4 mg Intravenous QID    Continuous Infusions:   PRN Meds: acetaminophen **OR** acetaminophen, hydrocortisone, LORazepam, morphine injection, ondansetron **OR** ondansetron (ZOFRAN) IV  Physical Exam  Constitutional: She appears lethargic. She appears cachectic. She appears ill.  -transitioning at EOL  Cardiovascular: Normal rate, regular rhythm and normal heart sounds.   - noted + 2 edema all extremities  Pulmonary/Chest: She has decreased breath sounds in the right lower field and the left lower field.  -shallow breath with periods of apnea  Neurological: She appears lethargic.  Skin: Skin is warm and dry.            Vital Signs: BP (!) 89/16 (BP Location: Left Arm)   Pulse 61   Temp 98 F (36.7 C) (Oral)   Resp 10   Ht '5\' 2"'$  (1.575 m)   Wt 58 kg (127 lb 12.8 oz)   SpO2 96%   BMI 23.37 kg/m    SpO2: SpO2: 96 % O2 Device: O2 Device: Nasal Cannula O2 Flow Rate: O2 Flow Rate (L/min): 2 L/min  Intake/output summary:  Intake/Output Summary (Last 24 hours) at 12/16/16 1122 Last data filed at 12/15/16 2211  Gross per 24 hour  Intake                0 ml  Output              350 ml  Net             -350  ml   LBM: Last BM Date: 12/01/2016 Baseline Weight: Weight: 58.6 kg (129 lb 3.2 oz) Most recent weight: Weight: 58 kg (127 lb 12.8 oz)       Palliative Assessment/Data: 20%    Flowsheet Rows   Flowsheet Row Most Recent Value  Intake Tab  Referral Department  Hospitalist  Unit at Time of Referral  Med/Surg Unit  Date Notified  12/14/16  Palliative Care Type  New Palliative care  Reason for referral  Clarify Goals of Care  Date of Admission  12/11/2016  Date first seen by Palliative Care  12/15/16  # of days Palliative referral response time  1 Day(s)  # of days IP prior to Palliative referral  1  Clinical Assessment  Palliative Performance Scale Score  20%  Psychosocial & Spiritual Assessment  Palliative Care Outcomes      Patient Active Problem List   Diagnosis Date Noted  . CHF (congestive heart failure) ([place]) 12/15/2016  . Pressure injury of skin 12/15/2016  . DNR (do not resuscitate) 12/15/2016  . Palliative care by specialist 12/15/2016  . Increased oropharyngeal secretions 12/15/2016  . Protein-calorie malnutrition, severe 12/15/2016  . Early satiety 12/18/2016  . Status post right hemicolectomy 11/29/2016  . Cough 12/03/2016  . Hypotension 12/16/2016  . SOB (shortness of breath) 11/25/2016  . Malnutrition of moderate degree 12/09/2016  . Colon cancer ([place]) 12/02/2016  . Malignant neoplasm of ascending colon ([place]) 11/30/2016  . SSS (sick sinus syndrome) ([place]) 11/05/2016  . Elevated troponin 11/05/2016  . UTI (lower urinary tract infection) 07/20/2016  . Pulmonary embolism ([place]) 11/20/2015  . CAP (community acquired pneumonia) 11/12/2015  . Mild chronic  obstructive pulmonary disease ([place]) 10/14/2015  . B12 deficiency 10/14/2015  . Pulmonary hypertension 09/25/2015  . Benign essential HTN 09/04/2015  . MI (mitral incompetence) 09/04/2015  . TI (tricuspid incompetence) 09/04/2015  . Insomnia, persistent 07/14/2015  . Mild dementia 07/14/2015  . H/O gastrointestinal disease 06/09/2015  . Hereditary and idiopathic peripheral neuropathy 05/12/2015  . D (diarrhea) 11/25/2014  . Chronic obstructive pulmonary disease ([place]) 10/20/2014  . Sensory neuropathy ([place]) 10/20/2014  . Neuropathy ([place]) 08/16/2014  . Dementia 08/16/2014  . Lung cancer ([place]) 08/16/2014  . Celiac disease 08/16/2014  . Atrial fibrillation ([place]) 08/16/2014  . Breath shortness 06/13/2012  . Encounter for adjustment and management of other part of cardiac pacemaker 05/10/2012  . Non-small cell carcinoma of lung ([place]) 09/08/2011  . Artificial cardiac pacemaker 09/08/2011  . Malignant pleural effusion 08/20/2010  . Cancer of lung ([place]) 04/24/2010    Palliative Care Assessment & Plan    Assessment: -  transitioning at EOL, comfort is main focus of care  Recommendations/Plan:  Terminal secretions: Increase Robinol frequency  Goals of Care and Additional Recommendations:  Limitations on Scope of Treatment: Full Comfort Care  Code Status:    Code Status Orders        Start     Ordered   12/15/16 0859  Do not attempt resuscitation (DNR)  Continuous    Question Answer Comment  In the event of cardiac or respiratory ARREST Do not call a "code blue"   In the event of cardiac or respiratory ARREST Do not perform Intubation, CPR, defibrillation or ACLS   In the event of cardiac or respiratory ARREST Use medication by any route, position, wound care, and other measures to relive pain and suffering. May use oxygen, suction and manual treatment of airway obstruction as needed for comfort.  12/15/16 0858    Code Status History    Date Active Date Inactive Code  Status Order ID Comments User Context   12/05/2016  5:42 PM 12/15/2016  8:58 AM Full Code [id number]  Epifanio Lesches, MD Inpatient   12/18/2016  4:43 PM 12/15/2016  4:52 PM Full Code [id number]  Robert Bellow, MD Outpatient   12/02/2016  3:29 PM 12/10/2016  3:19 PM Full Code [id number]  Robert Bellow, MD Inpatient   11/05/2016  9:53 PM 11/06/2016  9:03 PM Full Code [id number]  Lance Coon, MD Inpatient   07/20/2016  7:33 PM 07/21/2016  4:40 PM Full Code [id number]  Hillary Bow, MD ED   11/15/2015  9:41 AM 11/17/2015  6:55 PM Full Code [id number]  Saundra Shelling, MD Inpatient   11/12/2015  5:46 AM 11/13/2015  8:12 PM Full Code [id number]  Harrie Foreman, MD ED    Advance Directive Documentation   Flowsheet Row Most Recent Value  Type of Advance Directive  Healthcare Power of Attorney, Living will  Pre-existing out of facility DNR order (yellow form or pink MOST form)  No data  "MOST" Form in Place?  No data       Prognosis:   Hours - Days  Discharge Planning:  Anticipated Hospital Death  Care plan was discussed with Dr Manuella Ghazi and family at bedsdie  Thank you for allowing the Palliative Medicine Team to assist in the care of this patient.   Time In: 1045 Time Out: 1120 Total Time 35 min Prolonged Time Billed  no       Greater than 50%  of this time was spent counseling and coordinating care related to the above assessment and plan.  Wadie Lessen, NP  Please contact Palliative Medicine Team phone at [phone] for questions and concerns.

## 2016-12-16 NOTE — Plan of Care (Signed)
Problem: Education: Goal: Knowledge of the prescribed therapeutic regimen will improve Outcome: Progressing Family remains at bedside; supportive and appropriate behavior.

## 2016-12-17 DIAGNOSIS — K117 Disturbances of salivary secretion: Secondary | ICD-10-CM

## 2016-12-17 DIAGNOSIS — Z515 Encounter for palliative care: Secondary | ICD-10-CM

## 2016-12-17 MED ORDER — SCOPOLAMINE 1 MG/3DAYS TD PT72
1.0000 | MEDICATED_PATCH | TRANSDERMAL | Status: DC
  Administered 2016-12-17: 1.5 mg via TRANSDERMAL
  Filled 2016-12-17: qty 1

## 2016-12-17 MED ORDER — SCOPOLAMINE HBR 0.4 MG/ML IJ SOLN: 0.4000 mg | Freq: Once | INTRAMUSCULAR | Status: DC

## 2016-12-17 NOTE — Progress Notes (Signed)
Daily Progress Note   Patient Name: Cheryl Cannon       Date: 12/17/2016 DOB: May 05, 1936  Age: 81 y.o. MRN#: [id number] Attending Physician: Max Sane, MD Primary Care Physician: Glendon Axe, MD Admit Date: 11/25/2016  Reason for Consultation/Follow-up: Establishing goals of care, Non pain symptom management, Pain control, Psychosocial/spiritual support and Terminal Care   Subjective: Visited with family at bedside. Patient is lethargic but comfortable with shallow respirations. No signs or symptoms of pain or distress. Mottled skin.   Provided emotional and spiritual support to family. Answered questions and concerns. Appreciative of our conversation.   Length of Stay: 2  Current Medications: Scheduled Meds:  . glycopyrrolate  0.6 mg Intravenous QID  . scopolamine  1 patch Transdermal Q72H    Continuous Infusions:  PRN Meds: acetaminophen **OR** acetaminophen, hydrocortisone, LORazepam, morphine injection, ondansetron **OR** ondansetron (ZOFRAN) IV  Physical Exam  Constitutional: She appears lethargic. She appears cachectic. She appears ill.  -transitioning at EOL  HENT:  Head: Normocephalic and atraumatic.  Neck: JVD present.  Cardiovascular: Tachycardia present.   Pulmonary/Chest: She has decreased breath sounds.  Regular/shallow  Musculoskeletal: She exhibits edema (generalized).  Neurological: She appears lethargic.  Skin: Skin is warm. There is pallor.  Mottled  Nursing note and vitals reviewed.          Vital Signs: BP (!) 100/18 (BP Location: Left Arm)   Pulse 100   Temp (!) 103.3 F (39.6 C) (Axillary)   Resp 16   Ht '5\' 2"'$  (1.575 m)   Wt 58 kg (127 lb 12.8 oz)   SpO2 92%   BMI 23.37 kg/m  SpO2: SpO2: 92 % O2 Device: O2 Device: Nasal Cannula O2 Flow  Rate: O2 Flow Rate (L/min): 3 L/min  Intake/output summary:   Intake/Output Summary (Last 24 hours) at 12/17/16 1446 Last data filed at 12/17/16 1020  Gross per 24 hour  Intake                0 ml  Output              550 ml  Net             -550 ml   LBM: Last BM Date: 11/29/2016 Baseline Weight: Weight: 58.6 kg (129 lb 3.2 oz) Most recent weight: Weight: 58 kg (127 lb  12.8 oz)       Palliative Assessment/Data: 10%   Flowsheet Rows   Flowsheet Row Most Recent Value  Intake Tab  Referral Department  Hospitalist  Unit at Time of Referral  Med/Surg Unit  Date Notified  12/14/16  Palliative Care Type  New Palliative care  Reason for referral  Clarify Goals of Care  Date of Admission  12/16/2016  Date first seen by Palliative Care  12/15/16  # of days Palliative referral response time  1 Day(s)  # of days IP prior to Palliative referral  1  Clinical Assessment  Palliative Performance Scale Score  10%  Psychosocial & Spiritual Assessment  Palliative Care Outcomes  Patient/Family meeting held?  Yes  Who was at the meeting?  family at bedside  [place]  Provided end of life care assistance, Provided psychosocial or spiritual support      Patient Active Problem List   Diagnosis Date Noted  . End of life care   . CHF (congestive heart failure) ([place]) 12/15/2016  . Pressure injury of skin 12/15/2016  . DNR (do not resuscitate) 12/15/2016  . Palliative care by specialist 12/15/2016  . Increased oropharyngeal secretions 12/15/2016  . Protein-calorie malnutrition, severe 12/15/2016  . Early satiety 12/21/2016  . Status post right hemicolectomy 12/07/2016  . Cough 12/06/2016  . Hypotension 11/30/2016  . SOB (shortness of breath) 12/16/2016  . Malnutrition of moderate degree 12/09/2016  . Colon cancer ([place]) 12/02/2016  . Malignant neoplasm of ascending colon ([place]) 11/30/2016  . SSS (sick sinus syndrome) ([place]) 11/05/2016  . Elevated troponin 11/05/2016  . UTI  (lower urinary tract infection) 07/20/2016  . Pulmonary embolism ([place]) 11/20/2015  . CAP (community acquired pneumonia) 11/12/2015  . Mild chronic obstructive pulmonary disease ([place]) 10/14/2015  . B12 deficiency 10/14/2015  . Pulmonary hypertension 09/25/2015  . Benign essential HTN 09/04/2015  . MI (mitral incompetence) 09/04/2015  . TI (tricuspid incompetence) 09/04/2015  . Insomnia, persistent 07/14/2015  . Mild dementia 07/14/2015  . H/O gastrointestinal disease 06/09/2015  . Hereditary and idiopathic peripheral neuropathy 05/12/2015  . D (diarrhea) 11/25/2014  . Chronic obstructive pulmonary disease ([place]) 10/20/2014  . Sensory neuropathy ([place]) 10/20/2014  . Neuropathy ([place]) 08/16/2014  . Dementia 08/16/2014  . Lung cancer ([place]) 08/16/2014  . Celiac disease 08/16/2014  . Atrial fibrillation ([place]) 08/16/2014  . Breath shortness 06/13/2012  . Encounter for adjustment and management of other part of cardiac pacemaker 05/10/2012  . Non-small cell carcinoma of lung ([place]) 09/08/2011  . Artificial cardiac pacemaker 09/08/2011  . Malignant pleural effusion 08/20/2010  . Cancer of lung ([place]) 04/24/2010    Palliative Care Assessment & Plan    Assessment: -  transitioning at EOL, comfort is main focus of care  Recommendations/Plan:  DNR/DNI  Comfort measures only  Actively dying. Likely hours.   Provided support to patient and family.   Goals of Care and Additional Recommendations:  Limitations on Scope of Treatment: Full Comfort Care  Code Status:DNR    Code Status Orders        Start     Ordered   12/15/16 0859  Do not attempt resuscitation (DNR)  Continuous    Question Answer Comment  In the event of cardiac or respiratory ARREST Do not call a "code blue"   In the event of cardiac or respiratory ARREST Do not perform Intubation, CPR, defibrillation or ACLS   In the event of cardiac or respiratory ARREST Use medication by any route, position, wound care,  and  other measures to relive pain and suffering. May use oxygen, suction and manual treatment of airway obstruction as needed for comfort.      12/15/16 0858    Code Status History    Date Active Date Inactive Code Status Order ID Comments User Context   12/15/2016  5:42 PM 12/15/2016  8:58 AM Full Code [id number]  Epifanio Lesches, MD Inpatient   12/01/2016  4:43 PM 12/06/2016  4:52 PM Full Code [id number]  Robert Bellow, MD Outpatient   12/02/2016  3:29 PM 12/10/2016  3:19 PM Full Code [id number]  Robert Bellow, MD Inpatient   11/05/2016  9:53 PM 11/06/2016  9:03 PM Full Code [id number]  Lance Coon, MD Inpatient   07/20/2016  7:33 PM 07/21/2016  4:40 PM Full Code [id number]  Hillary Bow, MD ED   11/15/2015  9:41 AM 11/17/2015  6:55 PM Full Code [id number]  Saundra Shelling, MD Inpatient   11/12/2015  5:46 AM 11/13/2015  8:12 PM Full Code [id number]  Harrie Foreman, MD ED    Advance Directive Documentation   Flowsheet Row Most Recent Value  Type of Advance Directive  Healthcare Power of Attorney, Living will  Pre-existing out of facility DNR order (yellow form or pink MOST form)  No data  "MOST" Form in Place?  No data       Prognosis:   Hours - Days  Discharge Planning:  Anticipated Hospital Death  Care plan was discussed with family at bedside  Thank you for allowing the Palliative Medicine Team to assist in the care of this patient.   Time In: 1425 Time Out: 1450 Total Time 63mn Prolonged Time Billed  no       Greater than 50%  of this time was spent counseling and coordinating care related to the above assessment and plan.  MIhor Dow FNP-C Palliative Medicine Team  Phone: [phone]: [phone] Please contact Palliative Medicine Team phone at [phone] questions and concerns.

## 2016-12-17 NOTE — Progress Notes (Signed)
[place] at [place] NAME: Cheryl Cannon    MR#:  [id number]  DATE OF BIRTH:  09-05-1936  SUBJECTIVE:  Actively dying, family at bedside REVIEW OF SYSTEMS:   Review of Systems  Unable to perform ROS: Critical illness   DRUG ALLERGIES:   Allergies  Allergen Reactions  . Gluten Meal Other (See Comments)    GI UPSET/ Celiac disease.  . Parabens Rash    VITALS:  Blood pressure (!) 100/18, pulse 100, temperature (!) 103.3 F (39.6 C), temperature source Axillary, resp. rate 16, height '5\' 2"'$  (1.575 m), weight 58 kg (127 lb 12.8 oz), SpO2 92 %. PHYSICAL EXAMINATION:  Physical Exam  Constitutional: She appears lethargic and malnourished. She appears unhealthy. She appears cachectic. She appears toxic. She has a sickly appearance.  HENT:  Head: Normocephalic and atraumatic.  Eyes: Conjunctivae and EOM are normal. Pupils are equal, round, and reactive to light.  Neck: Normal range of motion. Neck supple. No tracheal deviation present. No thyromegaly present.  Cardiovascular: Normal rate, regular rhythm and normal heart sounds.   Pulmonary/Chest: Effort normal and breath sounds normal. No respiratory distress. She has no wheezes. She exhibits no tenderness.  Abdominal: Soft. Bowel sounds are normal. She exhibits no distension. There is no tenderness.  scar  Musculoskeletal: Normal range of motion.  Neurological: She appears lethargic. She is disoriented. No cranial nerve deficit.  Unable to assess due to her mental status  Skin: Skin is warm and dry. No rash noted.  Psychiatric:  Unable to assess due to mental state    LABORATORY PANEL:  CBC  Recent Labs Lab 12/15/16 0856  WBC 22.5*  HGB 9.5*  HCT 27.9*  PLT 251    Chemistries   Recent Labs Lab 12/15/16 0856  NA 122*  K 5.3*  CL 87*  CO2 23  GLUCOSE 134*  BUN 43*  CREATININE 1.71*  CALCIUM 8.4*   Cardiac Enzymes No results for input(s): TROPONINI in the last  168 hours. RADIOLOGY:  No results found. ASSESSMENT AND PLAN:  Cheryl Cannon  is a 81 y.o. female with a known history of Atrial fibrillation, COPD, dementia, recent right hemicolectomy, discharged on 19th of this month to rehabilitation by Dr. Fleet Contras.. Patient was here from January 11 to January 19, patient had  righthemicolectomy for colon cancer. Came in with hypotension, sob and weakness  1. Acute Systolic CHF - Comfort care  2. Nausea/Poor appetie -Comfort care  3.Recent right hemicolectomy for colon cancer -Comfort care  4. H/o afib  -Comfort care  5. COPD    Family at bedside.  Patient seemed to be actively dying.  I doubt she will be in a condition to get transferred to hospice home at this point   Case discussed with Care Management/Social Worker. Management plans discussed with the patient, family and they are in agreement.  CODE STATUS: DO NOT RESUSCITATE comfort care  TOTAL TIME TAKING CARE OF THIS PATIENT: 10 minutes.   >50% time spent on counselling and coordination of care  Note: This dictation was prepared with Dragon dictation along with smaller phrase technology. Any transcriptional errors that result from this process are unintentional.  Max Sane M.D on 12/17/2016 at 3:39 PM  Between 7am to 6pm - Pager - [phone]  After 6pm go to [email or web address] - password [email or web address] [facility]  Office  [phone]  CC: Primary care physician; Glendon Axe, MD

## 2016-12-17 NOTE — Care Management Important Message (Signed)
Important Message  Patient Details  Name: Cheryl Cannon MRN: [id number] Date of Birth: [birth year]   Medicare Important Message Given:  Yes    Shelbie Ammons, RN 12/17/2016, 8:08 AM

## 2016-12-18 MED ORDER — KETOROLAC TROMETHAMINE 15 MG/ML IJ SOLN
15.0000 mg | Freq: Four times a day (QID) | INTRAMUSCULAR | Status: DC | PRN
  Administered 2016-12-19: 15 mg via INTRAVENOUS
  Filled 2016-12-18: qty 1

## 2016-12-18 MED ORDER — KETOROLAC TROMETHAMINE 30 MG/ML IJ SOLN
30.0000 mg | Freq: Once | INTRAMUSCULAR | Status: AC
  Administered 2016-12-18: 30 mg via INTRAVENOUS
  Filled 2016-12-18: qty 1

## 2016-12-18 NOTE — Progress Notes (Addendum)
[place] at [place] NAME: Cheryl Cannon    MR#:  [id number]  DATE OF BIRTH:  [birth year]  SUBJECTIVE:  Patient is encephalopathic, comfort care measures and actively dying family at bedside REVIEW OF SYSTEMS:   Review of Systems  Unable to perform ROS: Critical illness   DRUG ALLERGIES:   Allergies  Allergen Reactions  . Gluten Meal Other (See Comments)    GI UPSET/ Celiac disease.  . Parabens Rash    VITALS:  Blood pressure (!) 122/50, pulse (!) 127, temperature (!) 102.9 F (39.4 C), temperature source Oral, resp. rate 16, height '5\' 2"'$  (1.575 m), weight 58 kg (127 lb 12.8 oz), SpO2 95 %. PHYSICAL EXAMINATION:  Physical Exam  Constitutional: She appears lethargic and malnourished. She appears unhealthy. She appears cachectic. She appears toxic. She has a sickly appearance.  Neck: No tracheal deviation present. No thyromegaly present.  Cardiovascular: Normal rate, regular rhythm and normal heart sounds.   Pulmonary/Chest: Effort normal and breath sounds normal. No respiratory distress. She has no wheezes. She exhibits no tenderness.  Abdominal: Soft. Bowel sounds are normal. She exhibits no distension. There is no tenderness.  scar  Musculoskeletal: Normal range of motion.  Neurological: She appears lethargic. She is disoriented. No cranial nerve deficit.  Unable to assess due to her mental status  Skin: Skin is warm and dry. No rash noted.  Psychiatric:  Unable to assess due to mental state    LABORATORY PANEL:  CBC  Recent Labs Lab 12/15/16 0856  WBC 22.5*  HGB 9.5*  HCT 27.9*  PLT 251    Chemistries   Recent Labs Lab 12/15/16 0856  NA 122*  K 5.3*  CL 87*  CO2 23  GLUCOSE 134*  BUN 43*  CREATININE 1.71*  CALCIUM 8.4*   Cardiac Enzymes No results for input(s): TROPONINI in the last 168 hours. RADIOLOGY:  No results found. ASSESSMENT AND PLAN:  Cheryl Cannon  is a 81 y.o. female with a known history  of Atrial fibrillation, COPD, dementia, recent right hemicolectomy, discharged on 19th of this month to rehabilitation by Dr. Fleet Contras.. Patient was here from January 11 to January 19, patient had  righthemicolectomy for colon cancer. Came in with hypotension, sob and weakness  1. Acute Systolic CHF - Comfort care  2. Nausea/Poor appetie -Comfort care  3.Recent right hemicolectomy for colon cancer -Comfort care  4. H/o afib  -Comfort care  5. COPD    Family at bedside.  Patient seemed to be actively dying.  Unstable to transfer the patient to hospice care facility  Case discussed with Care Management/Social Worker. Management plans discussed with the patient, family and they are in agreement.  CODE STATUS: DO NOT RESUSCITATE comfort care  TOTAL TIME TAKING CARE OF THIS PATIENT: 15 minutes.   >50% time spent on counselling and coordination of care  Note: This dictation was prepared with Dragon dictation along with smaller phrase technology. Any transcriptional errors that result from this process are unintentional.  Nicholes Mango M.D on 12/18/2016 at 3:53 PM  Between 7am to 6pm - Pager - [phone]  After 6pm go to [email or web address] - password [email or web address] [place]  Office  [phone]  CC: Primary care physician; Glendon Axe, MD

## 2016-12-19 MED ORDER — GLYCOPYRROLATE 0.2 MG/ML IJ SOLN
0.6000 mg | Freq: Two times a day (BID) | INTRAMUSCULAR | Status: DC | PRN
  Administered 2016-12-19: 05:00:00 0.6 mg via INTRAVENOUS
  Filled 2016-12-19: qty 3

## 2016-12-20 LAB — SURGICAL PATHOLOGY

## 2016-12-23 NOTE — Discharge Summary (Signed)
Date of admission 11/22/2016 Date of death 2017-01-16  Brief history and physical Addison Freimuth  is a 81 y.o. female with a known history of Atrial fibrillation, COPD, dementia, recent right hemicolectomy, discharged on 19th of this month to rehabilitation by Dr. Fleet Contras.. Patient was here from January 11 to January 19, patient had  righthemicolectomy for colon cancer that time. Patient to cardiac compromised by rectal bleeding after any reinitiation of a liquid was on postop day 4. Patient went to follow up with him. Found to be elevated up to 100.received metoprolol 12.5 mg by mouth which dropped her blood pressure and also heart rate. BP 74/16 systolic and heart rate 59. Concerning this I was asked to see the patient. Patient having lots of cough, also has pedal edema and anasarca, since the discharge from rehabilitation not eating much. During the last discharge metoprolol was held because of soft blood pressure. Please review history and physical for complete details  Hospital course  Patient clinically did not improve and after discussing with the family members patient was made comfort care. Strict comfort care measures implemented. Patient was not transferred to hospice home as she was unsteady. Patient is deceased today comfortably. All significant family members including husband and sister are present bedside. Deep condolences are conveyed to the family members  Time spent 15 minutes

## 2016-12-23 NOTE — Progress Notes (Signed)
Pt DNR/NMP; pt without apical pulse and respirations; pronounced with ASharen Counter RN; chaplin paged and nursing supervisor notified; see flowsheet/postmortem

## 2016-12-23 NOTE — Progress Notes (Signed)
   Dec 20, 2016 1200  Clinical Encounter Type  Visited With Patient and family together  Visit Type Death  Referral From Nurse  Consult/Referral To Faith community  Spiritual Encounters  Spiritual Needs Prayer  Stress Factors  Patient Stress Factors None identified  Family Stress Factors Loss   Chaplain was paged to provided emtional and spiritual support to the Pt's family after passing. Chaplain prayed with family and offered a listening ear. If this family is in need of more support please call our  spiritual care office [place], [place]

## 2016-12-23 DEATH — deceased

## 2017-02-15 ENCOUNTER — Ambulatory Visit: Payer: Self-pay | Admitting: Neurology

## 2017-12-15 IMAGING — CT CT ANGIO CHEST
2 of 7 series · 18 of 36 positions shown · IV contrast (APPLIED)
Comparison: Chest x-ray 12/06/2016, CT chest 07/17/2016, 11/12/2015

CLINICAL DATA: Shortness of breath with exertion history of right
middle and lower lobe lobectomy and radiation

EXAM:
CT ANGIOGRAPHY CHEST WITH CONTRAST
TECHNIQUE: Multidetector CT imaging of the chest was performed using the
standard protocol during bolus administration of intravenous
contrast. Multiplanar CT image reconstructions and MIPs were
obtained to evaluate the vascular anatomy.
CONTRAST:  75 mL Isovue 370 intravenous

[Series 5: thins · axial · 0.67mm/px · z∈[-551,-277]mm · 15 of 314 slices shown]
[im 20/314  lung]
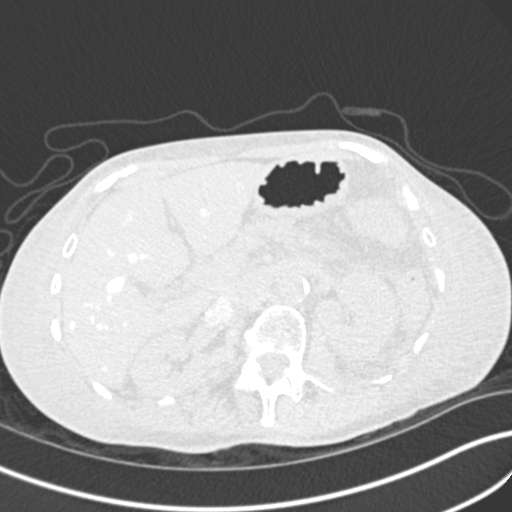
[im 40/314  mediastinal]
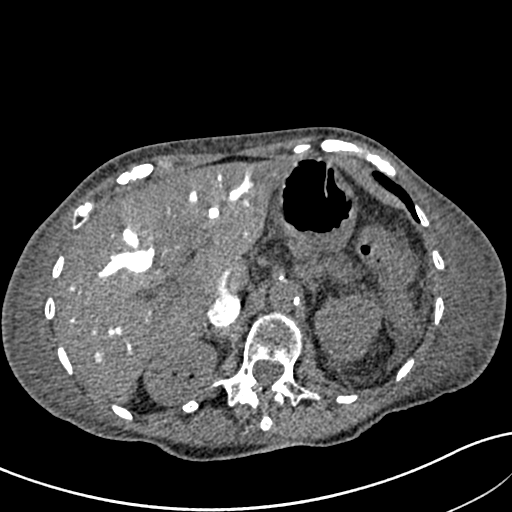
[im 59/314  lung]
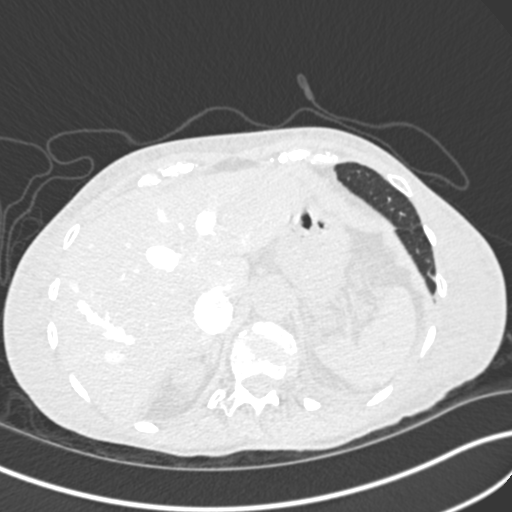
[im 79/314  mediastinal]
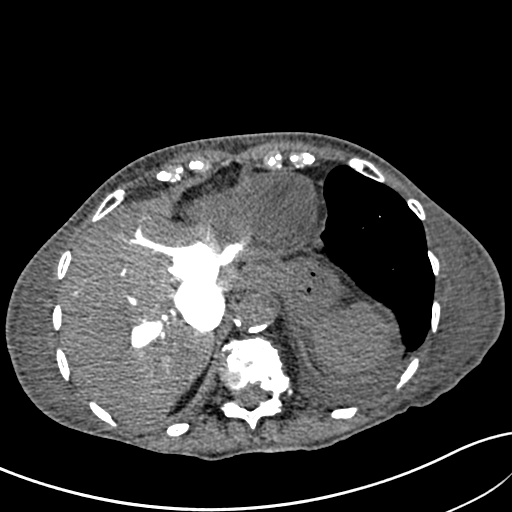
[im 98/314  lung]
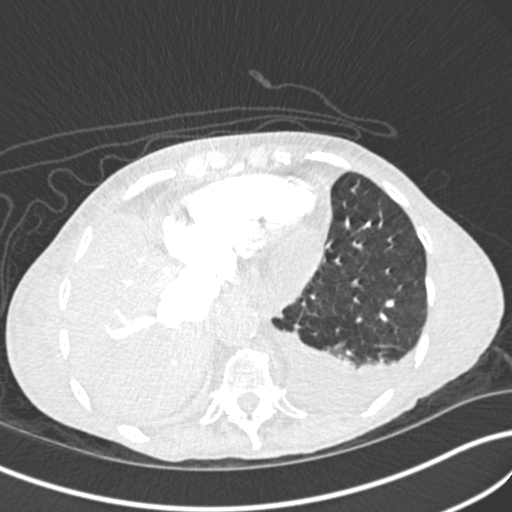
[im 118/314  mediastinal]
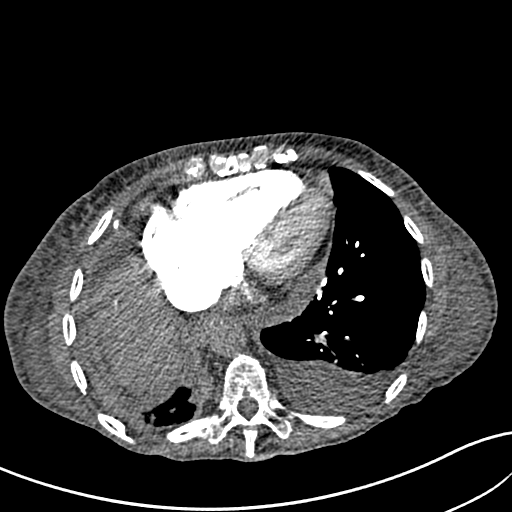
[im 137/314  lung]
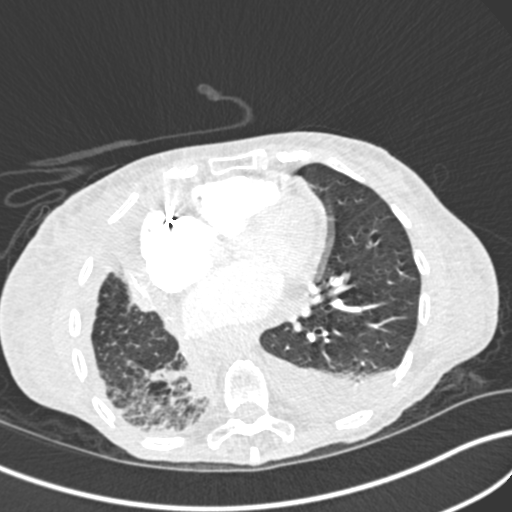
[im 157/314  mediastinal]
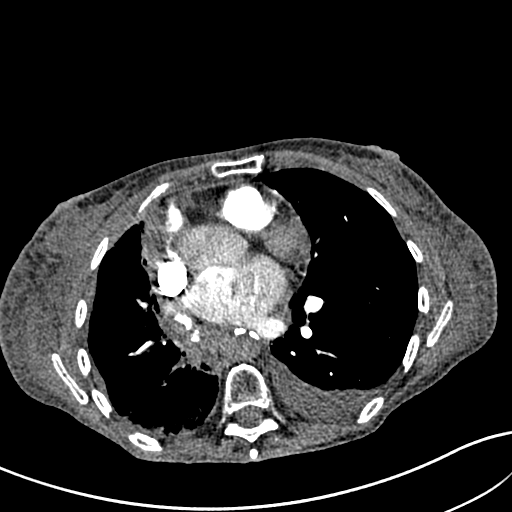
[im 177/314  lung]
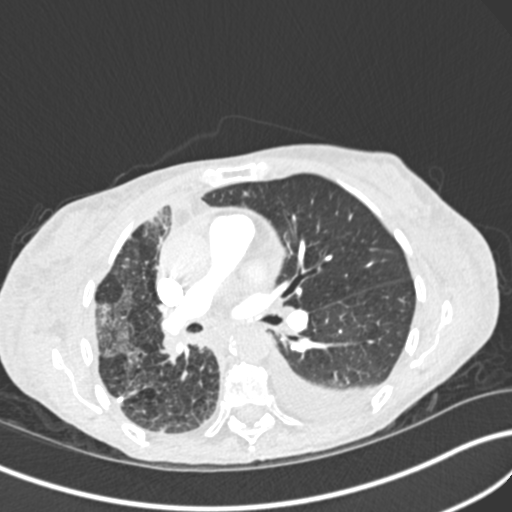
[im 196/314  mediastinal]
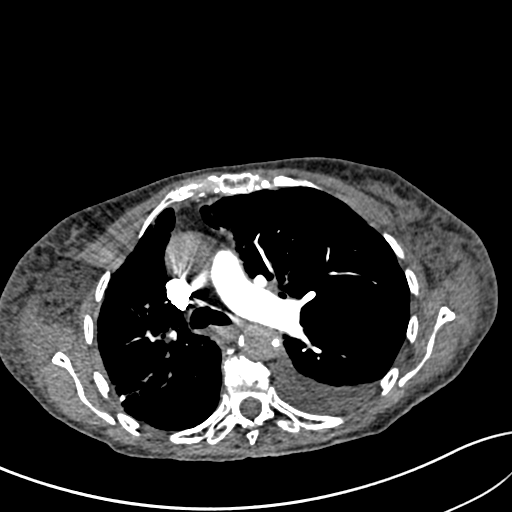
[im 216/314  lung]
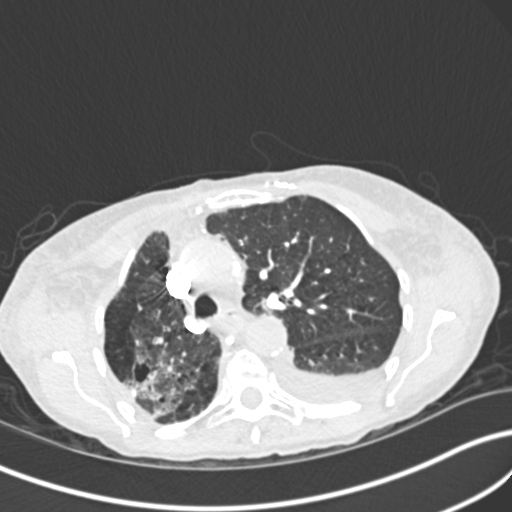
[im 235/314  mediastinal]
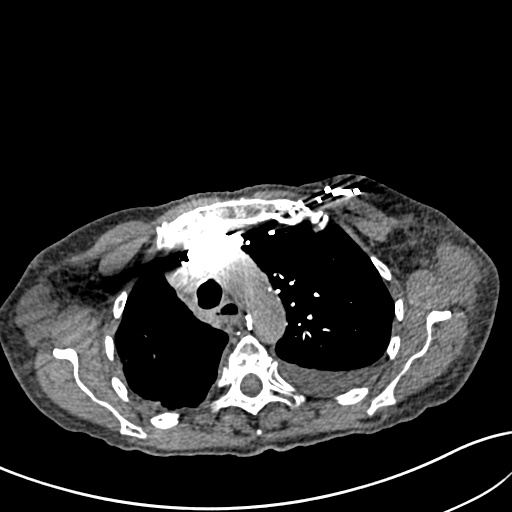
[im 255/314  lung]
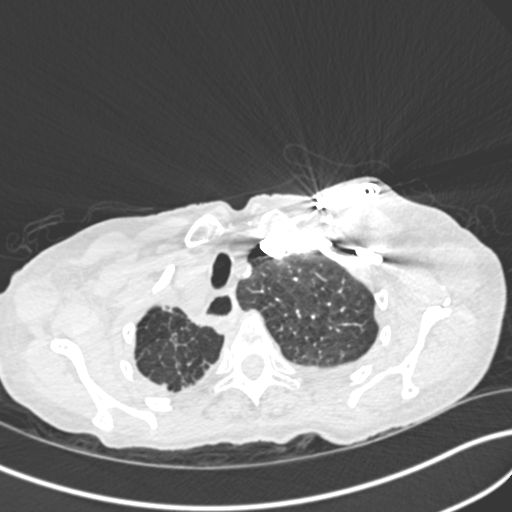
[im 274/314  mediastinal]
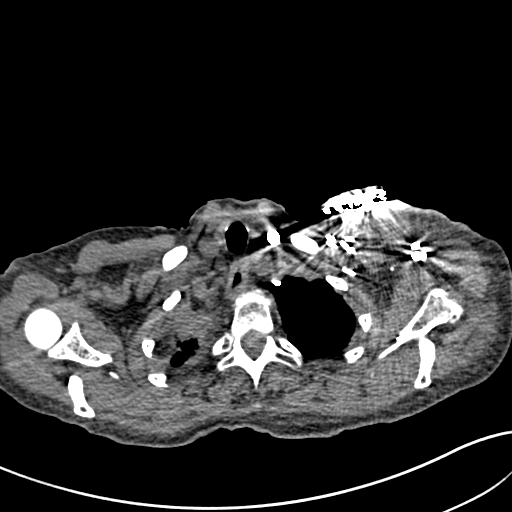
[im 294/314  lung]
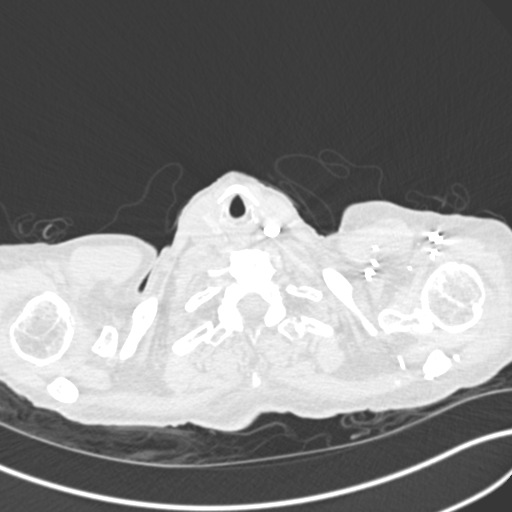

[Series 6: lung · axial · 0.67mm/px · z∈[-473,-329]mm · 3 of 97 slices shown]
[im 25/97  mediastinal]
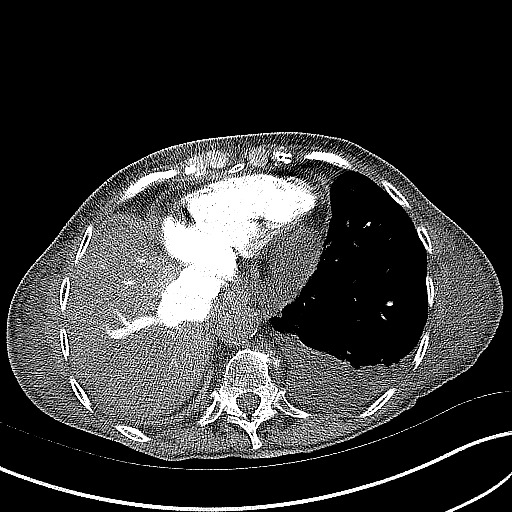
[im 49/97  mediastinal]
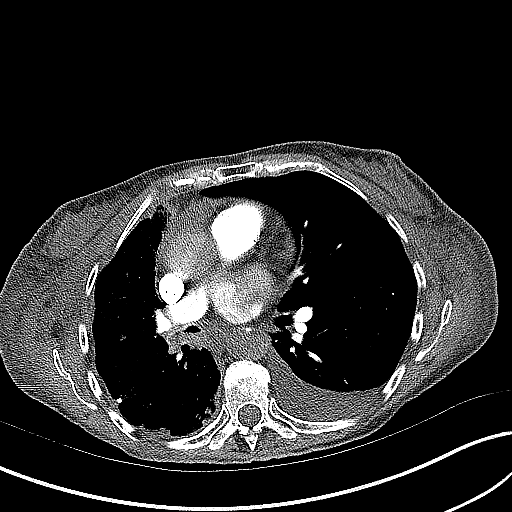
[im 73/97  mediastinal]
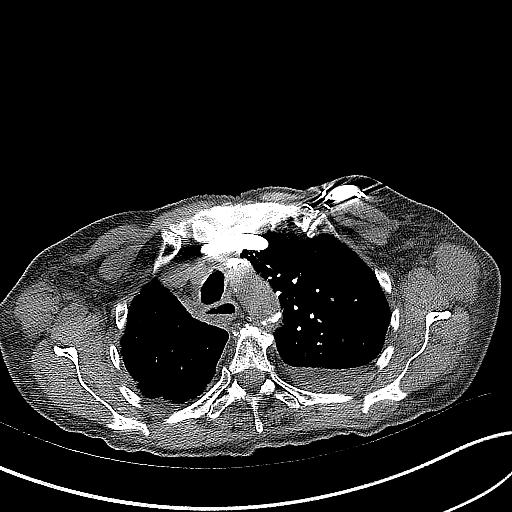

[18 of 36 positions shown; findings below may reference images not displayed]

FINDINGS: Cardiovascular: Satisfactory opacification of the pulmonary arteries
to the segmental level. No evidence of pulmonary embolism. Norm non
aneurysmal aorta. Atherosclerotic calcifications are present.
Coronary artery calcifications. Heart size borderline to mildly
enlarged. Small pericardial effusion.

Mediastinum/Nodes: Thyroid grossly unremarkable. Trachea and
mainstem bronchi within normal limits. Mild air distention of the
upper esophagus.

Lungs/Pleura: Volume loss on the right with prior lobectomy changes
noted. Stable moderate emphysematous disease bilaterally. Scattered
areas of fibrosis in the right lung, some of which are likely
related to post treatment changes. Chronic changes in the right lung
base. Scattered hazy densities in the right mid and upper lung could
reflect superimposed infection or inflammatory process. Mild
ground-glass density in the subpleural left upper lobe. Ill-defined
4 mm ground-glass stellate density in the subpleural lingula. 1.2 x
1.4 cm slightly spiculated nodule in the left lower lobe, series 6,
image number 69. Small moderate persistent effusion. Calcified
granuloma in the left lower lobe. No pneumothorax.

Upper Abdomen: Marked reflux of contrast into the hepatic venous
system. No acute abnormality in the upper abdomen.

Musculoskeletal: Degenerative changes of the spine. No acute or
suspicious bone lesion. Moderate generalized body wall edema.

Review of the MIP images confirms the above findings.
IMPRESSION: 1. No convincing evidence for acute pulmonary embolus.
2. Volume loss on the right with prior lobectomy changes. Moderate
emphysematous disease bilaterally. Scattered areas of fibrosis in
the right lung, stable at the lung base. Scattered hazy densities
within the right mid and upper lung as well as the left upper lobe,
could reflect acute infectious or inflammatory process.
3. 1.4 cm slightly spiculated nodule in the left lower lobe, cannot
exclude metastatic focus or primary lung carcinoma. Suggest
correlation with PET-CT.
4. Small to moderate left pleural effusion.
5. Marked reflux of contrast into the hepatic venous system suggests
elevated right heart pressure
6. Moderate generalized body wall edema consistent with anasarca
